# Patient Record
Sex: Female | Born: 1955 | Race: White | Hispanic: No | Smoking: Never smoker
Health system: Southern US, Community
[De-identification: ages and names within clinical notes are randomized; demographics above are authoritative.]

## PROBLEM LIST (undated history)

## (undated) ENCOUNTER — Emergency Department: Payer: Self-pay

## (undated) DIAGNOSIS — J329 Chronic sinusitis, unspecified: Secondary | ICD-10-CM

## (undated) DIAGNOSIS — I10 Essential (primary) hypertension: Secondary | ICD-10-CM

## (undated) HISTORY — PX: ECTOPIC PREGNANCY SURGERY: SHX613

---

## 2014-09-21 ENCOUNTER — Telehealth: Payer: Self-pay | Admitting: Interventional Cardiology

## 2014-11-26 SURGERY — LAPAROSCOPIC CHOLECYSTECTOMY
Anesthesia: Choice

## 2015-01-18 ENCOUNTER — Ambulatory Visit: Payer: Self-pay | Attending: Oncology | Admitting: *Deleted

## 2015-01-18 ENCOUNTER — Encounter: Payer: Self-pay | Admitting: *Deleted

## 2015-01-18 ENCOUNTER — Other Ambulatory Visit: Payer: Self-pay | Admitting: *Deleted

## 2015-01-18 ENCOUNTER — Ambulatory Visit
Admission: RE | Admit: 2015-01-18 | Discharge: 2015-01-18 | Disposition: A | Discharge status: Home / Self Care | Payer: Self-pay | Source: Ambulatory Visit | Attending: Oncology | Admitting: Oncology

## 2015-01-18 VITALS — BP 150/89 | HR 87 | Temp 97.3°F | Resp 18 | Ht <= 58 in | Wt 139.1 lb

## 2015-01-18 DIAGNOSIS — Z0000 Encounter for general adult medical examination without abnormal findings: Secondary | ICD-10-CM

## 2015-01-18 DIAGNOSIS — N6489 Other specified disorders of breast: Secondary | ICD-10-CM

## 2015-01-18 NOTE — Progress Notes (Signed)
Subjective:     Patient ID: Jenille Laszlo, female   DOB: June 26, 1955, 59 y.o.   MRN: [id number]  HPI   Review of Systems     Objective:   Physical Exam  Pulmonary/Chest: Right breast exhibits no inverted nipple, no mass, no nipple discharge, no skin change and no tenderness. Left breast exhibits no inverted nipple, no mass, no nipple discharge, no skin change and no tenderness.       Assessment:     59 year old White female presents to [facility] for clinical breast exam and mammogram.  On clinical breast exam bilateral breast and abdomen with diffuse keratotic like lesions.  Taught self breast awareness. Patient has been screened for eligibility.  She does not have any insurance, Medicare or Medicaid.  She also meets financial eligibility.  Hand-out given on the Affordable Care Act.     Plan:     Screening mammogram ordered.  To follow-up per protocol.

## 2015-01-18 NOTE — Patient Instructions (Signed)
Gave patient hand-out, Women Staying Healthy, Active and Well from [facility], with education on breast health, pap smears, heart and colon health. 

## 2015-01-22 ENCOUNTER — Ambulatory Visit: Payer: Self-pay

## 2015-01-22 ENCOUNTER — Ambulatory Visit
Admission: RE | Admit: 2015-01-22 | Discharge: 2015-01-22 | Disposition: A | Discharge status: Home / Self Care | Payer: Self-pay | Source: Ambulatory Visit | Attending: Oncology | Admitting: Oncology

## 2015-01-22 DIAGNOSIS — N6489 Other specified disorders of breast: Secondary | ICD-10-CM

## 2015-01-27 ENCOUNTER — Encounter: Payer: Self-pay | Admitting: *Deleted

## 2015-01-27 NOTE — Progress Notes (Signed)
Letter mailed from the [facility] to inform patient of her normal mammogram results.  Patient is to follow-up with annual screening in one year.  HSIS to Christy. 

## 2015-07-14 ENCOUNTER — Emergency Department: Payer: Self-pay

## 2015-07-14 ENCOUNTER — Emergency Department
Admission: EM | Admit: 2015-07-14 | Discharge: 2015-07-14 | Disposition: A | Discharge status: Home / Self Care | Payer: Self-pay | Attending: Emergency Medicine | Admitting: Emergency Medicine

## 2015-07-14 ENCOUNTER — Encounter: Payer: Self-pay | Admitting: Emergency Medicine

## 2015-07-14 DIAGNOSIS — R0602 Shortness of breath: Secondary | ICD-10-CM | POA: Insufficient documentation

## 2015-07-14 DIAGNOSIS — R079 Chest pain, unspecified: Secondary | ICD-10-CM | POA: Insufficient documentation

## 2015-07-14 DIAGNOSIS — F419 Anxiety disorder, unspecified: Secondary | ICD-10-CM | POA: Insufficient documentation

## 2015-07-14 LAB — CBC
HCT: 39.1 % (ref 35.0–47.0)
Hemoglobin: 13.1 g/dL (ref 12.0–16.0)
MCH: 29.4 pg (ref 26.0–34.0)
MCHC: 33.5 g/dL (ref 32.0–36.0)
MCV: 87.8 fL (ref 80.0–100.0)
Platelets: 239 10*3/uL (ref 150–440)
RBC: 4.45 MIL/uL (ref 3.80–5.20)
RDW: 12.7 % (ref 11.5–14.5)
WBC: 8 10*3/uL (ref 3.6–11.0)

## 2015-07-14 LAB — BASIC METABOLIC PANEL
Anion gap: 7 (ref 5–15)
BUN: 12 mg/dL (ref 6–20)
CO2: 26 mmol/L (ref 22–32)
Calcium: 9.3 mg/dL (ref 8.9–10.3)
Chloride: 107 mmol/L (ref 101–111)
Creatinine, Ser: 0.97 mg/dL (ref 0.44–1.00)
GFR calc Af Amer: >60 mL/min (ref >60)
GFR calc non Af Amer: >60 mL/min (ref >60)
Glucose, Bld: 117 mg/dL — ABNORMAL HIGH (ref 65–99)
Potassium: 4.5 mmol/L (ref 3.5–5.1)
Sodium: 140 mmol/L (ref 135–145)

## 2015-07-14 LAB — TROPONIN I: Troponin I: <0.03 ng/mL (ref <0.031)

## 2015-07-14 NOTE — ED Notes (Signed)
Patient presents to the ED with chest pain and shortness of breath.  Patient states she has had chest pain that felt like a pressure on the left side of her chest intermittently for about 2 weeks.  Today patient states as she was driving her car she felt a sudden sharp chest pain that caused her to feel anxious and short of breath.  Patient states that pain has been intermittent since that time.

## 2017-12-19 ENCOUNTER — Emergency Department
Admission: EM | Admit: 2017-12-19 | Discharge: 2017-12-19 | Disposition: A | Discharge status: Home / Self Care | Payer: Self-pay | Attending: Emergency Medicine | Admitting: Emergency Medicine

## 2017-12-19 ENCOUNTER — Encounter: Payer: Self-pay | Admitting: *Deleted

## 2017-12-19 ENCOUNTER — Emergency Department: Payer: Self-pay

## 2017-12-19 ENCOUNTER — Other Ambulatory Visit: Payer: Self-pay

## 2017-12-19 DIAGNOSIS — R0789 Other chest pain: Secondary | ICD-10-CM

## 2017-12-19 DIAGNOSIS — R079 Chest pain, unspecified: Secondary | ICD-10-CM | POA: Insufficient documentation

## 2017-12-19 DIAGNOSIS — T63461A Toxic effect of venom of wasps, accidental (unintentional), initial encounter: Secondary | ICD-10-CM

## 2017-12-19 LAB — CBC
HCT: 39.6 % (ref 35.0–47.0)
Hemoglobin: 13.7 g/dL (ref 12.0–16.0)
MCH: 30.8 pg (ref 26.0–34.0)
MCHC: 34.5 g/dL (ref 32.0–36.0)
MCV: 89.3 fL (ref 80.0–100.0)
Platelets: 244 10*3/uL (ref 150–440)
RBC: 4.43 MIL/uL (ref 3.80–5.20)
RDW: 13.4 % (ref 11.5–14.5)
WBC: 7.9 10*3/uL (ref 3.6–11.0)

## 2017-12-19 LAB — BASIC METABOLIC PANEL
Anion gap: 9 (ref 5–15)
BUN: 17 mg/dL (ref 8–23)
CO2: 23 mmol/L (ref 22–32)
Calcium: 8.9 mg/dL (ref 8.9–10.3)
Chloride: 103 mmol/L (ref 98–111)
Creatinine, Ser: 1.07 mg/dL — ABNORMAL HIGH (ref 0.44–1.00)
GFR calc Af Amer: >60 mL/min (ref >60)
GFR calc non Af Amer: 54 mL/min — ABNORMAL LOW (ref >60)
Glucose, Bld: 179 mg/dL — ABNORMAL HIGH (ref 70–99)
Potassium: 3.9 mmol/L (ref 3.5–5.1)
Sodium: 135 mmol/L (ref 135–145)

## 2017-12-19 LAB — TROPONIN I
Troponin I: <0.03 ng/mL (ref <0.03)
Troponin I: <0.03 ng/mL (ref <0.03)

## 2017-12-19 MED ORDER — METHYLPREDNISOLONE SODIUM SUCC 125 MG IJ SOLR
125.0000 mg | Freq: Once | INTRAMUSCULAR | Status: AC
  Administered 2017-12-19: 125 mg via INTRAVENOUS
  Filled 2017-12-19: qty 2

## 2017-12-19 MED ORDER — EPINEPHRINE 0.3 MG/0.3ML IJ SOAJ: 0.3000 mg | Freq: Once | INTRAMUSCULAR | 0 refills | Status: AC

## 2017-12-19 MED ORDER — PREDNISONE 10 MG (21) PO TBPK: ORAL_TABLET | Freq: Every day | ORAL | 0 refills | Status: DC

## 2017-12-19 MED ORDER — EPINEPHRINE 0.3 MG/0.3ML IJ SOAJ
INTRAMUSCULAR | Status: AC
  Filled 2017-12-19: qty 0.3

## 2017-12-19 MED ORDER — SODIUM CHLORIDE 0.9 % IV SOLN
Freq: Once | INTRAVENOUS | Status: AC
  Administered 2017-12-19: 14:00:00 via INTRAVENOUS

## 2017-12-19 NOTE — ED Notes (Addendum)
Pt denies shortness of breath or difficulty swallowing - she is c/o chest pain that comes and goes - pt has a rash on bilat arms/legs/chest and abd Dr Mayford KnifeWilliams at bedside Pt got stung by 10-12 yellow jackets and took 50mg  benadryl immediately Pt went to [facility] and they advised her to come to the ED The incident about one hour ago

## 2017-12-19 NOTE — ED Provider Notes (Signed)
[facility] Emergency Department Provider Note       Time seen: ----------------------------------------- 1:43 PM on 12/19/2017 -----------------------------------------   I have reviewed the triage vital signs and the nursing notes.  HISTORY   Chief Complaint No chief complaint on file.    HPI Stacey Francis is a 62 y.o. female with a history of chronic fatigue who presents to the ED for chest pain after being stung by 8-10 yellow jackets this afternoon.  This occurred approximately 45 minutes to an hour prior to arrival.  She took Benadryl at home without any relief, was noted to have a rash all over parts of her body but worse on her abdomen.  She started having chest tightness prior to coming to the ER.  She presents here for further evaluation.  She denies any difficulty breathing or swallowing.  She denies a history of any previous severe reaction.  No past medical history on file.  There are no active problems to display for this patient.   No past surgical history on file.  Allergies Penicillins  Social History Social History   Tobacco Use  . Smoking status: Never Smoker  . Smokeless tobacco: Never Used  Substance Use Topics  . Alcohol use: No  . Drug use: No   Review of Systems Constitutional: Negative for fever. ENT:  Negative for difficulty swallowing Cardiovascular: Positive for chest pain Respiratory: Negative for shortness of breath. Gastrointestinal: Negative for abdominal pain, vomiting and diarrhea. Musculoskeletal: Negative for back pain. Skin: Positive for hives Neurological: Negative for headaches, focal weakness or numbness.  All systems negative/normal/unremarkable except as stated in the HPI  ____________________________________________   PHYSICAL EXAM:  VITAL SIGNS: ED Triage Vitals  Enc Vitals Group     BP      Pulse      Resp      Temp      Temp src      SpO2      Weight      Height      Head  Circumference      Peak Flow      Pain Score      Pain Loc      Pain Edu?      Excl. in GC?    Constitutional: Alert and oriented. Well appearing and in no distress. Eyes: Conjunctivae are normal. Normal extraocular movements. ENT   Head: Normocephalic and atraumatic.   Nose: No congestion/rhinnorhea.   Mouth/Throat: Mucous membranes are moist.  No swelling   Neck: No stridor. Cardiovascular: Normal rate, regular rhythm. No murmurs, rubs, or gallops. Respiratory: Normal respiratory effort without tachypnea nor retractions. Breath sounds are clear and equal bilaterally. No wheezes/rales/rhonchi. Gastrointestinal: Soft and nontender. Normal bowel sounds Musculoskeletal: Nontender with normal range of motion in extremities. No lower extremity tenderness nor edema. Neurologic:  Normal speech and language. No gross focal neurologic deficits are appreciated.  Skin: Diffuse urticarial lesions, worse over the extremities, but also on the trunk Psychiatric: Mood and affect are normal. Speech and behavior are normal.  ____________________________________________  EKG: Interpreted by me.  Sinus rhythm rate 95 bpm, normal PR interval, normal QRS, normal QT  ____________________________________________  ED COURSE:  As part of my medical decision making, I reviewed the following data within the electronic MEDICAL RECORD NUMBER History obtained from family if available, nursing notes, old chart and ekg, as well as notes from prior ED visits. Patient presented for chest pain and recent yellowjacket envenomation, we will assess with  labs and imaging as indicated at this time.   Procedures ____________________________________________   LABS (pertinent positives/negatives)  Labs Reviewed  BASIC METABOLIC PANEL - Abnormal; Notable for the following components:      Result Value   Glucose, Bld 179 (*)    Creatinine, Ser 1.07 (*)    GFR calc non Af Amer 54 (*)    All other components  within normal limits  CBC  TROPONIN I  TROPONIN I    RADIOLOGY Images were viewed by me  Chest x-ray Is unremarkable ____________________________________________  DIFFERENTIAL DIAGNOSIS   Allergic reaction, anaphylaxis, MI, unstable angina, anxiety  FINAL ASSESSMENT AND PLAN  Yellowjacket stings, chest pain   Plan: The patient had presented for chest pain with recent yellowjacket stings. Patient's labs were unremarkable although repeat troponin is still pending. Patient's imaging was negative.  This was likely anxiety from recent yellowjacket stings.  She is low risk for ACS and has improved with fluids and Solu-Medrol with Benadryl here.  She will be discharged home with similar.   Ulice Dash, MD   Note: This note was generated in part or whole with voice recognition software. Voice recognition is usually quite accurate but there are transcription errors that can and very often do occur. I apologize for any typographical errors that were not detected and corrected.     Emily Filbert, MD 12/19/17 [phone]

## 2017-12-19 NOTE — ED Triage Notes (Signed)
Pt to ED reporting new onset of chest pain after being stung 8-10 times by bees this afternoon. Pt denies ever having a reaction to bee stings in the past and took 50 mg of benadryl at home without relief. Rash noted all over pts body but worse on her abd. Pt reports the chest tightness started prior to coming to the ED and denies SOB or feeling as though her throat is closing. While in triage pt also reports dizziness.

## 2017-12-19 NOTE — ED Notes (Signed)
Pt reports chest pain has subsided - denies shortness of breath - denies difficulty swallowing Pt in no acute distress

## 2017-12-19 NOTE — ED Provider Notes (Signed)
Signout from Dr. Mayford KnifeWilliams in this 62 year old female who is presenting to the emergency department today complaining of multiple bee stings.  Never suffered any shortness of breath or feeling of throat closure.  Just with itchy rash/hives.  Plan is to follow-up with repeat troponin secondary to patient initially complaining of chest pain.  Physical Exam  BP (!) 149/73   Pulse 87   Temp 98.4 F (36.9 C) (Oral)   Resp (!) 21   Wt 59 kg (130 lb)   SpO2 98%   BMI 23.03 kg/m   ----------------------------------------- 4:32 PM on 12/19/2017 -----------------------------------------    Physical Exam Patient at this time with several areas of circular erythema surrounding sting sites on the right upper extremity, distally on the hand and the forearm.  No further hives.  Patient still denying any feeling of throat closure or shortness of breath.  Not reporting any chest pain at this time. ED Course/Procedures     Procedures  MDM  Patient with improvement in reaction to her bee stings.  Not complaining of any chest pain at this time.  Will be discharged with prednisone we will also discharged with EpiPen.  Patient understanding the diagnosis as well as treatment plan willing to comply.       Myrna BlazerSchaevitz, Stacey Brashears Matthew, MD 12/19/17 [phone]

## 2017-12-19 NOTE — ED Triage Notes (Signed)
First RN Note: Pt reports stung by about 10 bees approx PTA to L forearm. Pt now c/o CP and hives, no known allergies to bee stings at this time. Pt is alert and oriented, able to speak in complete sentences without difficulty or SOB.

## 2017-12-26 ENCOUNTER — Emergency Department
Admission: EM | Admit: 2017-12-26 | Discharge: 2017-12-26 | Disposition: A | Discharge status: Home / Self Care | Payer: Self-pay | Attending: Emergency Medicine | Admitting: Emergency Medicine

## 2017-12-26 ENCOUNTER — Other Ambulatory Visit: Payer: Self-pay

## 2017-12-26 DIAGNOSIS — T887XXA Unspecified adverse effect of drug or medicament, initial encounter: Secondary | ICD-10-CM

## 2017-12-26 DIAGNOSIS — L509 Urticaria, unspecified: Secondary | ICD-10-CM

## 2017-12-26 DIAGNOSIS — T63441A Toxic effect of venom of bees, accidental (unintentional), initial encounter: Secondary | ICD-10-CM | POA: Insufficient documentation

## 2017-12-26 DIAGNOSIS — Y69 Unspecified misadventure during surgical and medical care: Secondary | ICD-10-CM | POA: Insufficient documentation

## 2017-12-26 MED ORDER — RANITIDINE HCL 150 MG PO TABS: 150.0000 mg | ORAL_TABLET | Freq: Two times a day (BID) | ORAL | 0 refills | Status: DC

## 2017-12-26 MED ORDER — CYPROHEPTADINE HCL 4 MG PO TABS: 4.0000 mg | ORAL_TABLET | Freq: Three times a day (TID) | ORAL | 0 refills | Status: DC | PRN

## 2017-12-26 NOTE — Discharge Instructions (Addendum)
Your exam is consistent with possible common side effects from your steroid. Continue to dose your prescription meds for   histamine blockade as directed. Follow-up with your provider for ongoing symptoms.

## 2017-12-26 NOTE — ED Notes (Signed)
Pt states she was seen and treated last week here for anaphylactic reaction to multiple bee stings, states she took last prednisone yesterday and last night noticed her skin felt like it was burning and has hive on the right wrist and forehead and states she feels like she is having some throat swelling, but is not having any difficulty swallowing and breathing at present.

## 2017-12-26 NOTE — ED Triage Notes (Addendum)
Pt states "I'm having a weird reaction to coming off steroids." states last dose yesterday. Was taking prednisone. Alert, oriented, ambulatory. No distress noted. States skin feels like sun burn, sore throat, and anxiety. States breathing in fine. Denies any lip swelling. States was put on prednisone for bee stings to R wrist and forehead.

## 2017-12-26 NOTE — ED Provider Notes (Signed)
[facility] Emergency Department Provider Note ____________________________________________  Time seen: 1400  I have reviewed the triage vital signs and the nursing notes.  HISTORY  Chief Complaint  Allergic Reaction  HPI Stacey Francis is a 62 y.o. female presents to the ED accompanied by family, for evaluation of recurrent hives following multiple bee stings.  Patient describes she was stung by multiple yellow jackets last week. She presented herself to the ED after the onset of chest pain. She denies a history of anaphylaxis to previous bee stings. She was sent home on prednisone and an epi-pen. She has been improving until last night, after her last steroid pill. She began to notice some sore throat, muscle pains, and anxiety. She has been eating and drinking without difficulty. She denies any chest pain, SOB, cough, wheeze, or swelling of the lips or tongue.   History reviewed. No pertinent past medical history.  There are no active problems to display for this patient.   History reviewed. No pertinent surgical history.  Prior to Admission medications   Medication Sig Start Date End Date Taking? Authorizing Provider  cyproheptadine (PERIACTIN) 4 MG tablet Take 1 tablet (4 mg total) by mouth 3 (three) times daily as needed for allergies. 12/26/17   Arlenis Blaydes, Charlesetta IvoryJenise V Bacon, PA-C  predniSONE (STERAPRED UNI-PAK 21 TAB) 10 MG (21) TBPK tablet Take by mouth daily. Dispense steroid taper pack as directed 12/19/17   Emily FilbertWilliams, Jonathan E, MD  ranitidine (ZANTAC) 150 MG tablet Take 1 tablet (150 mg total) by mouth 2 (two) times daily. 12/26/17   Ludie Pavlik, Charlesetta IvoryJenise V Bacon, PA-C   Allergies Penicillins  History reviewed. No pertinent family history.  Social History Social History   Tobacco Use  . Smoking status: Never Smoker  . Smokeless tobacco: Never Used  Substance Use Topics  . Alcohol use: No  . Drug use: No    Review of Systems  Constitutional: Negative  for fever. Eyes: Negative for visual changes. ENT: Positive for sore throat. Cardiovascular: Negative for chest pain. Respiratory: Negative for shortness of breath. Gastrointestinal: Negative for abdominal pain, vomiting and diarrhea. Genitourinary: Negative for dysuria. Musculoskeletal: Negative for back pain. Reports some general muscle pain Skin: Negative for rash. Neurological: Negative for headaches, focal weakness or numbness. ____________________________________________  PHYSICAL EXAM:  VITAL SIGNS: ED Triage Vitals  Enc Vitals Group     BP 12/26/17 1217 (!) 183/91     Pulse Rate 12/26/17 1217 (!) 108     Resp 12/26/17 1217 18     Temp 12/26/17 1217 98.6 F (37 C)     Temp Source 12/26/17 1217 Oral     SpO2 12/26/17 1217 100 %     Weight 12/26/17 1218 135 lb (61.2 kg)     Height 12/26/17 1218 5\' 3"  (1.6 m)     Head Circumference --      Peak Flow --      Pain Score 12/26/17 1218 4     Pain Loc --      Pain Edu? --      Excl. in GC? --     Constitutional: Alert and oriented. Well appearing and in no distress. Head: Normocephalic and atraumatic. Eyes: Conjunctivae are normal. PERRL. Normal extraocular movements Ears: Canals clear. TMs intact bilaterally. Nose: No congestion/rhinorrhea/epistaxis. Mouth/Throat: Mucous membranes are moist.  Uvula is midline and tonsils are flat.  No oropharyngeal lesions, erythema, or edema appreciated. Neck: Supple. No thyromegaly. Hematological/Lymphatic/Immunological: No cervical lymphadenopathy. Cardiovascular: Normal rate, regular rhythm. Normal  distal pulses. Respiratory: Normal respiratory effort. No wheezes/rales/rhonchi. Gastrointestinal: Soft and nontender. No distention. Musculoskeletal: Nontender with normal range of motion in all extremities.  Neurologic:  Normal gait without ataxia. Normal speech and language. No gross focal neurologic deficits are appreciated. Skin:  Skin is warm, dry and intact.  Patient with several  areas of some local erythema surrounding several of her previous yellowjacket stings.  No lymph and guidance, induration, warmth, fluctuance is appreciated. Psychiatric: Mood and affect are normal. Patient exhibits appropriate insight and judgment. ____________________________________________  PROCEDURES  Procedures ____________________________________________  INITIAL IMPRESSION / ASSESSMENT AND PLAN / ED COURSE  Patient with ED evaluation of some recurrent hives to the areas where she had been previously stung.  Patient's exam is overall benign.  No signs of any angioedema, anaphylaxis, airway compromise, or respiratory distress.  Patient may be experiencing side effects to the prednisone.  She has 1 pill remaining, has been advised that she may not necessarily need to finish that p.o.  She will be discharged however, with a prescription for ranitidine and Periactin, for histamine blockade.  Patient is encouraged to follow-up with her primary provider, or return to the ED immediately for worsening symptoms as discussed patient also encouraged to use her EpiPen should she develop any signs of difficulty breathing or angioedema.  Patient verbalizes understanding and is discharged without difficulty. ____________________________________________  FINAL CLINICAL IMPRESSION(S) / ED DIAGNOSES  Final diagnoses:  Hives  Side effect of drug      Lissa Hoard, PA-C 12/26/17 1908    Merrily Brittle, MD 12/27/17 1910

## 2018-10-10 DIAGNOSIS — Z1159 Encounter for screening for other viral diseases: Secondary | ICD-10-CM | POA: Insufficient documentation

## 2018-10-10 DIAGNOSIS — R079 Chest pain, unspecified: Secondary | ICD-10-CM | POA: Insufficient documentation

## 2018-10-10 NOTE — ED Triage Notes (Addendum)
Pt c/o chest pain, medial, substernal, reproducible w/ inspiration. Pt states her chest feels tight. Pt denies radiation of pain. Pt denies n/v/d, cardiac hx, fever. Pt has recent PMH of sinus infection, finishing a course of doxycycline presently. Pt ambulatory w/o distress to triage.Pt also c/o numbness on L cheek and L side of tongue starting 2230.

## 2018-10-11 ENCOUNTER — Emergency Department: Payer: Self-pay

## 2018-10-11 ENCOUNTER — Other Ambulatory Visit: Payer: Self-pay

## 2018-10-11 ENCOUNTER — Emergency Department
Admission: EM | Admit: 2018-10-11 | Discharge: 2018-10-11 | Disposition: A | Discharge status: Home / Self Care | Payer: Self-pay | Attending: Emergency Medicine | Admitting: Emergency Medicine

## 2018-10-11 ENCOUNTER — Encounter: Payer: Self-pay | Admitting: *Deleted

## 2018-10-11 DIAGNOSIS — R202 Paresthesia of skin: Secondary | ICD-10-CM

## 2018-10-11 DIAGNOSIS — R200 Anesthesia of skin: Secondary | ICD-10-CM

## 2018-10-11 DIAGNOSIS — R079 Chest pain, unspecified: Secondary | ICD-10-CM

## 2018-10-11 HISTORY — DX: Chronic sinusitis, unspecified: J32.9

## 2018-10-11 LAB — BASIC METABOLIC PANEL
Anion gap: 11 (ref 5–15)
BUN: 18 mg/dL (ref 8–23)
CO2: 24 mmol/L (ref 22–32)
Calcium: 9.2 mg/dL (ref 8.9–10.3)
Chloride: 106 mmol/L (ref 98–111)
Creatinine, Ser: 1.28 mg/dL — ABNORMAL HIGH (ref 0.44–1.00)
GFR calc Af Amer: 52 mL/min — ABNORMAL LOW (ref >60)
GFR calc non Af Amer: 44 mL/min — ABNORMAL LOW (ref >60)
Glucose, Bld: 103 mg/dL — ABNORMAL HIGH (ref 70–99)
Potassium: 3.7 mmol/L (ref 3.5–5.1)
Sodium: 141 mmol/L (ref 135–145)

## 2018-10-11 LAB — CBC
HCT: 38.5 % (ref 36.0–46.0)
Hemoglobin: 12.8 g/dL (ref 12.0–15.0)
MCH: 29.6 pg (ref 26.0–34.0)
MCHC: 33.2 g/dL (ref 30.0–36.0)
MCV: 89.1 fL (ref 80.0–100.0)
Platelets: 249 10*3/uL (ref 150–400)
RBC: 4.32 MIL/uL (ref 3.87–5.11)
RDW: 12.2 % (ref 11.5–15.5)
WBC: 8.4 10*3/uL (ref 4.0–10.5)
nRBC: 0 % (ref 0.0–0.2)

## 2018-10-11 LAB — TROPONIN I
Troponin I: <0.03 ng/mL (ref <0.03)
Troponin I: <0.03 ng/mL (ref <0.03)

## 2018-10-11 LAB — APTT: aPTT: <24 seconds — ABNORMAL LOW (ref 24–36)

## 2018-10-11 LAB — SARS CORONAVIRUS 2 BY RT PCR (HOSPITAL ORDER, PERFORMED IN ~~LOC~~ HOSPITAL LAB): SARS Coronavirus 2: NEGATIVE

## 2018-10-11 LAB — DIFFERENTIAL
Abs Immature Granulocytes: 0.02 10*3/uL (ref 0.00–0.07)
Basophils Absolute: 0.1 10*3/uL (ref 0.0–0.1)
Basophils Relative: 1 %
Eosinophils Absolute: 0.1 10*3/uL (ref 0.0–0.5)
Eosinophils Relative: 1 %
Immature Granulocytes: 0 %
Lymphocytes Relative: 28 %
Lymphs Abs: 2.4 10*3/uL (ref 0.7–4.0)
Monocytes Absolute: 0.7 10*3/uL (ref 0.1–1.0)
Monocytes Relative: 9 %
Neutro Abs: 5.2 10*3/uL (ref 1.7–7.7)
Neutrophils Relative %: 61 %

## 2018-10-11 LAB — PROTIME-INR
INR: 0.9 (ref 0.8–1.2)
Prothrombin Time: 12 seconds (ref 11.4–15.2)

## 2018-10-11 MED ORDER — ASPIRIN 81 MG PO CHEW
324.0000 mg | CHEWABLE_TABLET | Freq: Once | ORAL | Status: AC
  Administered 2018-10-11: 324 mg via ORAL
  Filled 2018-10-11: qty 4

## 2018-10-11 MED ORDER — SODIUM CHLORIDE 0.9% FLUSH
3.0000 mL | Freq: Once | INTRAVENOUS | Status: DC
Start: 2018-10-11 — End: 2018-10-11

## 2018-10-11 MED ORDER — SODIUM CHLORIDE 0.9% FLUSH: 3.0000 mL | Freq: Once | INTRAVENOUS | Status: DC

## 2018-10-11 NOTE — ED Notes (Signed)
Patient transported to MRI by this tech

## 2018-10-11 NOTE — Discharge Instructions (Signed)
As we discussed, we evaluated you extensively and your work-up was reassuring.  Your EKG is normal, you had two negative troponins (heart enzymes), the rest of your lab work was reassuring including a negative coronavirus test, and your MRI of your brain did not show any sign of recent stroke.  We offered to admit you to the hospital for possible mini stroke (TIA), but you would prefer not to stay in the hospital and you and I both agreed that it is likely not necessary.  I recommend you take a daily full dose aspirin (4 baby aspirin) and follow-up with your regular doctor and/or cardiologist such as Dr. Gwen Pounds for further evaluation of your chest pain and other symptoms.  If you develop ANY new or worsening symptoms that concern you, please return immediately to the emergency department.

## 2018-10-11 NOTE — ED Notes (Addendum)
Pt to MRI with ED tech

## 2018-10-11 NOTE — ED Notes (Signed)
Pt given phone to answer questions for MRI. Alert and able to answer questions without difficulty. Pt states she continues to feel better and pain is improving.

## 2018-10-11 NOTE — ED Provider Notes (Signed)
[facility] Emergency Department Provider Note  ____________________________________________   First MD Initiated Contact with Patient 10/11/18 [phone]     (approximate)  I have reviewed the triage vital signs and the nursing notes.   HISTORY  Chief Complaint Chest Pain    HPI Stacey Francis is a 63 y.o. female    who denies any chronic medical history and does not take medication for anything except that she was recently treated for a sinus infection with doxycycline.  She presents tonight for evaluation about 8 hours ago of acute onset tingling in bilateral hands.  Over the course of the evening she developed some aching and sharp central chest pain with some left-sided facial numbness and tingling including her tongue.  She has had no weakness but the numbness persists.  She denies shortness of breath, fever/chills, cough, nausea, vomiting, abdominal pain, and sweating episodes.  She has no personal history of cardiac disease or stroke.  She recently has started taking a daily baby aspirin but that was only over the last couple of weeks.  She was treated empirically with doxycycline for a sinus infection but about 8 or 9 days ago she was having body aches, sore throat, subjective fever, nasal congestion, and it was presumed to be a sinus infection although COVID-19 was also possible but it was not tested.  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  She says she is currently feeling better although she still has some tingling in her hands and on the left side of her face.  The chest pain has almost completely resolved.  She has no history of diabetes, hypertension, tobacco use, hyperlipidemia, and she does have some first-degree relatives that have had heart attacks but they were much older and also smoked and drank alcohol.        Past Medical History:  Diagnosis Date   Sinus infection     There are no  active problems to display for this patient.   History reviewed. No pertinent surgical history.  Prior to Admission medications   Medication Sig Start Date End Date Taking? Authorizing Provider  cyproheptadine (PERIACTIN) 4 MG tablet Take 1 tablet (4 mg total) by mouth 3 (three) times daily as needed for allergies. 12/26/17   Menshew, Charlesetta Ivory, PA-C  predniSONE (STERAPRED UNI-PAK 21 TAB) 10 MG (21) TBPK tablet Take by mouth daily. Dispense steroid taper pack as directed 12/19/17   Emily Filbert, MD  ranitidine (ZANTAC) 150 MG tablet Take 1 tablet (150 mg total) by mouth 2 (two) times daily. 12/26/17   Menshew, Charlesetta Ivory, PA-C    Allergies Penicillins  History reviewed. No pertinent family history.  Social History Social History   Tobacco Use   Smoking status: Never Smoker   Smokeless tobacco: Never Used  Substance Use Topics   Alcohol use: No   Drug use: No    Review of Systems Constitutional: No fever/chills Eyes: No visual changes. ENT: Recent viral or sinus infection symptoms, now mostly resolved on doxycycline. Cardiovascular: +chest pain. Respiratory: Denies shortness of breath. Gastrointestinal: No abdominal pain.  No nausea, no vomiting.  No diarrhea.  No constipation. Genitourinary: Negative for dysuria. Musculoskeletal: Negative for neck pain.  Negative for back pain. Integumentary: Negative for rash. Neurological: Numbness and tingling in bilateral hands as well as the left side of her face and tongue.   ____________________________________________   PHYSICAL EXAM:  VITAL  SIGNS: ED Triage Vitals  Enc Vitals Group     BP 10/11/18 0001 (!) 189/90     Pulse Rate 10/11/18 0001 95     Resp 10/11/18 0001 20     Temp 10/11/18 0001 98.5 F (36.9 C)     Temp Source 10/11/18 0001 Oral     SpO2 10/11/18 0001 98 %     Weight 10/11/18 0004 63.5 kg (140 lb)     Height 10/11/18 0004 1.6 m (5\' 3" )     Head Circumference --      Peak Flow --       Pain Score 10/11/18 0003 5     Pain Loc --      Pain Edu? --      Excl. in GC? --     Constitutional: Alert and oriented. Well appearing and in no acute distress. Eyes: Conjunctivae are normal. PERRL. EOMI. Head: Atraumatic. Nose: No congestion/rhinnorhea. Mouth/Throat: Mucous membranes are moist. Neck: No stridor.  No meningeal signs.   Cardiovascular: Normal rate, regular rhythm. Good peripheral circulation. Grossly normal heart sounds. Respiratory: Normal respiratory effort.  No retractions. No audible wheezing. Gastrointestinal: Soft and nontender. No distention.  Musculoskeletal: No lower extremity tenderness nor edema. No gross deformities of extremities. Neurologic:  Normal speech and language. No gross focal neurologic deficits are appreciated.  Her NIH stroke scale is 1 for subjective sensory deficits as described above.  She has good grip strength and muscle strength throughout her upper extremities, no tongue deviation, no obvious cranial nerve deficits. Skin:  Skin is warm, dry and intact. No rash noted. Psychiatric: Mood and affect are normal. Speech and behavior are normal.  ____________________________________________   LABS (all labs ordered are listed, but only abnormal results are displayed)  Labs Reviewed  BASIC METABOLIC PANEL - Abnormal; Notable for the following components:      Result Value   Glucose, Bld 103 (*)    Creatinine, Ser 1.28 (*)    GFR calc non Af Amer 44 (*)    GFR calc Af Amer 52 (*)    All other components within normal limits  APTT - Abnormal; Notable for the following components:   aPTT <24 (*)    All other components within normal limits  SARS CORONAVIRUS 2 (HOSPITAL ORDER, PERFORMED IN [facility])  CBC  TROPONIN I  PROTIME-INR  DIFFERENTIAL  TROPONIN I  CBG MONITORING, ED   ____________________________________________  EKG  ED ECG REPORT I, Loleta Roseory Thane Age, the attending physician, personally viewed and interpreted  this ECG.  Date: 10/11/2018 EKG Time: 00:01 Rate: 95 Rhythm: normal sinus rhythm QRS Axis: normal Intervals: normal ST/T Wave abnormalities: Non-specific ST segment / T-wave changes, but no clear evidence of acute ischemia. Narrative Interpretation: no definitive evidence of acute ischemia; does not meet STEMI criteria.   ____________________________________________  RADIOLOGY I, Loleta Roseory Naftali Carchi, personally discussed these images and results by phone with the on-call radiologist and used this discussion as part of my medical decision making.   ED MD interpretation: No obvious stroke on noncontrast head CT.  No evidence of CVA on MRI brain.  No evidence of acute abnormality on chest x-ray.  Official radiology report(s): Dg Chest 2 View  Result Date: 10/11/2018 CLINICAL DATA:  Chest pain EXAM: CHEST - 2 VIEW COMPARISON:  12/19/2017 FINDINGS: Heart and mediastinal contours are within normal limits. No focal opacities or effusions. No acute bony abnormality. IMPRESSION: No active cardiopulmonary disease. Electronically Signed   By: Charlett NoseKevin  Dover M.D.  On: 10/11/2018 01:22   Mr Brain Wo Contrast  Result Date: 10/11/2018 CLINICAL DATA:  63 y/o F; numbness on the left cheek and left-sided tongue. EXAM: MRI HEAD WITHOUT CONTRAST TECHNIQUE: Multiplanar, multiecho pulse sequences of the brain and surrounding structures were obtained without intravenous contrast. COMPARISON:  10/11/2018 CT head FINDINGS: Brain: No acute infarction, hemorrhage, hydrocephalus, extra-axial collection or mass lesion. Punctate and early confluent nonspecific T2 FLAIR hyperintensities in subcortical and periventricular white matter are compatible with mild to moderate chronic microvascular ischemic changes. Mild volume loss of the brain. Vascular: Left middle frontal gyrus branching vascular structure with surrounding T2 FLAIR signal abnormality in white matter, compatible with developmental venous anomaly (series 12, image 42).  Skull and upper cervical spine: Normal marrow signal. Sinuses/Orbits: Negative. Other: None. IMPRESSION: 1. No acute intracranial abnormality identified. 2. Mild-to-moderate chronic microvascular ischemic changes and mild volume loss of the brain. Electronically Signed   By: Mitzi Hansen M.D.   On: 10/11/2018 03:18   Ct Head Code Stroke Wo Contrast  Result Date: 10/11/2018 CLINICAL DATA:  Code stroke. Left cheek and left-sided tongue numbness. EXAM: CT HEAD WITHOUT CONTRAST TECHNIQUE: Contiguous axial images were obtained from the base of the skull through the vertex without intravenous contrast. COMPARISON:  None. FINDINGS: Brain: No evidence of acute infarction, hemorrhage, hydrocephalus, extra-axial collection or mass lesion/mass effect. Few nonspecific white matter hypodensities are compatible with chronic microvascular ischemic changes. Vascular: No hyperdense vessel or unexpected calcification. Skull: Normal. Negative for fracture or focal lesion. Sinuses/Orbits: No acute finding. Other: None. ASPECTS [facility] Stroke Program Early CT Score) - Ganglionic level infarction (caudate, lentiform nuclei, internal capsule, insula, M1-M3 cortex): 7 - Supraganglionic infarction (M4-M6 cortex): 3 Total score (0-10 with 10 being normal): 10 IMPRESSION: 1. No acute intracranial abnormality identified. 2. ASPECTS is 10 These results were called by telephone at the time of interpretation on 10/11/2018 at 12:34 am to Dr. Loleta Rose , who verbally acknowledged these results. Electronically Signed   By: Mitzi Hansen M.D.   On: 10/11/2018 00:37    ____________________________________________   PROCEDURES   Procedure(s) performed (including Critical Care):  Procedures   ____________________________________________   INITIAL IMPRESSION / MDM / ASSESSMENT AND PLAN / ED COURSE  As part of my medical decision making, I reviewed the following data within the electronic MEDICAL RECORD NUMBER  Nursing notes reviewed and incorporated, Labs reviewed , EKG interpreted , Old chart reviewed, Radiograph reviewed , Discussed with radiologist and Notes from prior ED visits      *ANDERIA LORENZO was evaluated in Emergency Department on 10/11/2018 for the symptoms described in the history of present illness. She was evaluated in the context of the global COVID-19 pandemic, which necessitated consideration that the patient might be at risk for infection with the SARS-CoV-2 virus that causes COVID-19. Institutional protocols and algorithms that pertain to the evaluation of patients at risk for COVID-19 are in a state of rapid change based on information released by regulatory bodies including the CDC and federal and state organizations. These policies and algorithms were followed during the patient's care in the ED.*  Differential diagnosis includes, but is not limited to, ACS, CVA, TIA, PE, COVID-19, pneumonia, other nonspecified viral infection.  The patient is already feeling better and is in no distress.  She is low risk for ACS but her symptoms are concerning.  However the presence of the chest discomfort suggest more of a cardiac nature but the numbness in her hands and the left side  of her face are more concerning for CVA/TIA.  She originally received a code stroke head CT but based on the presentation I did not make her a code stroke.  Her symptoms have been going on since much earlier this afternoon and she does not meet criteria for TPA based on her mild symptoms and the improvement of those symptoms.  However I think it is reasonable to obtain an MRI.  The patient does not want to stay in the hospital she does not have to (as if for a full "stroke work-up"), so we had an extensive discussion about it and decided to check for coronavirus given her recent viral symptoms and the possibility she may have to stay in the hospital, as well as checking basic labs, at least 2 troponins, and an MRI of her brain  to look for any evidence of acute ischemia.  If her work-up is reassuring and she continues to feel well, her preference is to go home and I think that is understandable and appropriate as long she follows up quickly as an outpatient.  Currently lab work is pending, MRI is pending, and we will plan on a repeat troponin assuming the first 1 comes back negative.  I am giving a full dose aspirin by mouth.  Patient passed the stroke swallow screen.  Clinical Course as of Oct 10 512  Fri Oct 11, 2018  2025 Spoke by phone with Dr. Harrie Jeans who reported no concerning findings on non-con head CT  CT HEAD CODE STROKE WO CONTRAST [CF]  0349 SARS Coronavirus 2: NEGATIVE [CF]  0349 No evidence of CVA.  MR BRAIN WO CONTRAST [CF]  0509 The patient's second troponin is negative.  I offered her again that we could admit her to the hospital for possible TIA but I do not think it is absolutely necessary and she does not want to stay in the hospital.  I recommended that she take a daily full dose aspirin rather than a baby aspirin and that she contact her regular doctor or a cardiologist to schedule a follow-up appointment.  She understands and agrees with the plan and I gave my usual and customary return precautions.   [CF]    Clinical Course User Index [CF] Loleta Rose, MD     ____________________________________________  FINAL CLINICAL IMPRESSION(S) / ED DIAGNOSES  Final diagnoses:  Chest pain, unspecified type  Numbness and tingling in both hands     MEDICATIONS GIVEN DURING THIS VISIT:  Medications  aspirin chewable tablet 324 mg (324 mg Oral Given 10/11/18 0435)     ED Discharge Orders    None       Note:  This document was prepared using Dragon voice recognition software and may include unintentional dictation errors.   Loleta Rose, MD 10/11/18 [phone]

## 2018-10-11 NOTE — ED Notes (Signed)
Pt returned from MRI. Alert and calm at this time. No distress noted.

## 2018-10-11 NOTE — ED Notes (Addendum)
Pt to xray; resting quietly on stretcher. States her chest pain is improving.

## 2018-10-24 ENCOUNTER — Encounter: Payer: Self-pay | Admitting: Emergency Medicine

## 2018-10-24 ENCOUNTER — Emergency Department
Admission: EM | Admit: 2018-10-24 | Discharge: 2018-10-24 | Disposition: A | Discharge status: Home / Self Care | Payer: Self-pay | Attending: Student in an Organized Health Care Education/Training Program | Admitting: Student in an Organized Health Care Education/Training Program

## 2018-10-24 ENCOUNTER — Other Ambulatory Visit: Payer: Self-pay

## 2018-10-24 DIAGNOSIS — I10 Essential (primary) hypertension: Secondary | ICD-10-CM | POA: Insufficient documentation

## 2018-10-24 DIAGNOSIS — Z79899 Other long term (current) drug therapy: Secondary | ICD-10-CM | POA: Insufficient documentation

## 2018-10-24 LAB — BASIC METABOLIC PANEL
Anion gap: 10 (ref 5–15)
BUN: 10 mg/dL (ref 8–23)
CO2: 24 mmol/L (ref 22–32)
Calcium: 9.3 mg/dL (ref 8.9–10.3)
Chloride: 107 mmol/L (ref 98–111)
Creatinine, Ser: 0.91 mg/dL (ref 0.44–1.00)
GFR calc Af Amer: >60 mL/min (ref >60)
GFR calc non Af Amer: >60 mL/min (ref >60)
Glucose, Bld: 101 mg/dL — ABNORMAL HIGH (ref 70–99)
Potassium: 4.2 mmol/L (ref 3.5–5.1)
Sodium: 141 mmol/L (ref 135–145)

## 2018-10-24 LAB — CBC
HCT: 40.7 % (ref 36.0–46.0)
Hemoglobin: 13.3 g/dL (ref 12.0–15.0)
MCH: 30 pg (ref 26.0–34.0)
MCHC: 32.7 g/dL (ref 30.0–36.0)
MCV: 91.7 fL (ref 80.0–100.0)
Platelets: 255 10*3/uL (ref 150–400)
RBC: 4.44 MIL/uL (ref 3.87–5.11)
RDW: 12.2 % (ref 11.5–15.5)
WBC: 6.7 10*3/uL (ref 4.0–10.5)
nRBC: 0 % (ref 0.0–0.2)

## 2018-10-24 MED ORDER — AMLODIPINE BESYLATE 5 MG PO TABS: 5.0000 mg | ORAL_TABLET | Freq: Every day | ORAL | 0 refills | Status: DC

## 2018-10-24 NOTE — ED Triage Notes (Signed)
Patient reports BP for last few days. States today, she felt anxious and dizzy. When she checked her BP at home, she noticed it was high. States got higher with each subsequent time. Patient also reports mild dizziness and pressure in head. Denies history of HTN. Does not take any BP medications.

## 2018-10-24 NOTE — ED Notes (Signed)
Reports high blood pressure x few days, md at bedside to assess.

## 2018-10-24 NOTE — ED Provider Notes (Signed)
[facility] Emergency Department Provider Note    First MD Initiated Contact with Patient 10/24/18 1800     (approximate)  I have reviewed the triage vital signs and the nursing notes.   HISTORY  Chief Complaint Hypertension and Dizziness    HPI Stacey Francis is a 63 y.o. female presents the ER for evaluation of high blood pressure.  States that she has been feeling nervous and anxious for the past month and was worried about her blood pressure.  States that she took it several times in a row and it kept getting elevated.  States that she was feeling lightheaded and came to the ER.  She is been trying to get clinic appointment but has been unable to read she is not currently on any antihypertensive medication.  She denies any chest pain shortness of breath nausea or vomiting.    Past Medical History:  Diagnosis Date  . Sinus infection    No family history on file. History reviewed. No pertinent surgical history. There are no active problems to display for this patient.     Prior to Admission medications   Medication Sig Start Date End Date Taking? Authorizing Provider  cyproheptadine (PERIACTIN) 4 MG tablet Take 1 tablet (4 mg total) by mouth 3 (three) times daily as needed for allergies. 12/26/17   Menshew, Charlesetta Ivory, PA-C  predniSONE (STERAPRED UNI-PAK 21 TAB) 10 MG (21) TBPK tablet Take by mouth daily. Dispense steroid taper pack as directed 12/19/17   Emily Filbert, MD  ranitidine (ZANTAC) 150 MG tablet Take 1 tablet (150 mg total) by mouth 2 (two) times daily. 12/26/17   Menshew, Charlesetta Ivory, PA-C    Allergies Penicillins    Social History Social History   Tobacco Use  . Smoking status: Never Smoker  . Smokeless tobacco: Never Used  Substance Use Topics  . Alcohol use: No  . Drug use: No    Review of Systems Patient denies headaches, rhinorrhea, blurry vision, numbness, shortness of breath, chest pain, edema, cough,  abdominal pain, nausea, vomiting, diarrhea, dysuria, fevers, rashes or hallucinations unless otherwise stated above in HPI. ____________________________________________   PHYSICAL EXAM:  VITAL SIGNS: Vitals:   10/24/18 1519  BP: (!) 182/90  Pulse: 96  Resp: 18  Temp: 98.8 F (37.1 C)    Constitutional: Alert and oriented.  Eyes: Conjunctivae are normal.  Head: Atraumatic. Nose: No congestion/rhinnorhea. Mouth/Throat: Mucous membranes are moist.   Neck: No stridor. Painless ROM.  Cardiovascular: Normal rate, regular rhythm. Grossly normal heart sounds.  Good peripheral circulation. Respiratory: Normal respiratory effort.  No retractions. Lungs CTAB. Gastrointestinal: Soft and nontender. No distention. No abdominal bruits. No CVA tenderness. Genitourinary:  Musculoskeletal: No lower extremity tenderness nor edema.  No joint effusions. Neurologic:  Normal speech and language. No gross focal neurologic deficits are appreciated. No facial droop Skin:  Skin is warm, dry and intact. No rash noted. Psychiatric: Mood and affect are normal. Speech and behavior are normal.  ____________________________________________   LABS (all labs ordered are listed, but only abnormal results are displayed)  Results for orders placed or performed during the hospital encounter of 10/24/18 (from the past 24 hour(s))  Basic metabolic panel     Status: Abnormal   Collection Time: 10/24/18  3:35 PM  Result Value Ref Range   Sodium 141 135 - 145 mmol/L   Potassium 4.2 3.5 - 5.1 mmol/L   Chloride 107 98 - 111 mmol/L   CO2 24  22 - 32 mmol/L   Glucose, Bld 101 (H) 70 - 99 mg/dL   BUN 10 8 - 23 mg/dL   Creatinine, Ser 4.690.91 0.44 - 1.00 mg/dL   Calcium 9.3 8.9 - 62.910.3 mg/dL   GFR calc non Af Amer >60 >60 mL/min   GFR calc Af Amer >60 >60 mL/min   Anion gap 10 5 - 15  CBC     Status: None   Collection Time: 10/24/18  3:35 PM  Result Value Ref Range   WBC 6.7 4.0 - 10.5 K/uL   RBC 4.44 3.87 - 5.11  MIL/uL   Hemoglobin 13.3 12.0 - 15.0 g/dL   HCT 52.840.7 41.336.0 - 24.446.0 %   MCV 91.7 80.0 - 100.0 fL   MCH 30.0 26.0 - 34.0 pg   MCHC 32.7 30.0 - 36.0 g/dL   RDW 01.012.2 27.211.5 - 53.615.5 %   Platelets 255 150 - 400 K/uL   nRBC 0.0 0.0 - 0.2 %   ____________________________________________  EKG My review and personal interpretation at Time: 15:26   Indication: htn  Rate: 90  Rhythm: sinus Axis: normal Other: normal intervals, no stemi ____________________________________________  ____________________________________________   PROCEDURES  Procedure(s) performed:  Procedures    Critical Care performed: no ____________________________________________   INITIAL IMPRESSION / ASSESSMENT AND PLAN / ED COURSE  Pertinent labs & imaging results that were available during my care of the patient were reviewed by me and considered in my medical decision making (see chart for details).   DDX: htn, htnive urgnecy, anxiety  Stacey Francis is a 63 y.o. who presents to the ED with symptoms as described above.  Patient is asymptomatic.  She is hypertensive but also very anxious.  Neuro exam is reassuring.  No signs or symptoms of endorgan damage.  We discussed options for initiating antihypertensive medication.  Patient elected to start Norvasc daily.  She has outpatient follow-up.  Have discussed with the patient and available family all diagnostics and treatments performed thus far and all questions were answered to the best of my ability. The patient demonstrates understanding and agreement with plan.      The patient was evaluated in Emergency Department today for the symptoms described in the history of present illness. He/she was evaluated in the context of the global COVID-19 pandemic, which necessitated consideration that the patient might be at risk for infection with the SARS-CoV-2 virus that causes COVID-19. Institutional protocols and algorithms that pertain to the evaluation of patients at risk  for COVID-19 are in a state of rapid change based on information released by regulatory bodies including the CDC and federal and state organizations. These policies and algorithms were followed during the patient's care in the ED.  As part of my medical decision making, I reviewed the following data within the electronic MEDICAL RECORD NUMBER Nursing notes reviewed and incorporated, Labs reviewed, notes from prior ED visits and [place] Controlled Substance Database   ____________________________________________   FINAL CLINICAL IMPRESSION(S) / ED DIAGNOSES  Final diagnoses:  Hypertension, unspecified type      NEW MEDICATIONS STARTED DURING THIS VISIT:  New Prescriptions   No medications on file     Note:  This document was prepared using Dragon voice recognition software and may include unintentional dictation errors.    Willy Eddyobinson, Kimi Bordeau, MD 10/24/18 [phone]

## 2018-12-03 ENCOUNTER — Encounter: Payer: Self-pay | Admitting: Emergency Medicine

## 2018-12-03 ENCOUNTER — Other Ambulatory Visit: Payer: Self-pay

## 2018-12-03 ENCOUNTER — Emergency Department
Admission: EM | Admit: 2018-12-03 | Discharge: 2018-12-03 | Disposition: A | Discharge status: Home / Self Care | Payer: Self-pay | Attending: Emergency Medicine | Admitting: Emergency Medicine

## 2018-12-03 ENCOUNTER — Emergency Department: Payer: Self-pay

## 2018-12-03 DIAGNOSIS — Z79899 Other long term (current) drug therapy: Secondary | ICD-10-CM | POA: Insufficient documentation

## 2018-12-03 DIAGNOSIS — R0789 Other chest pain: Secondary | ICD-10-CM | POA: Insufficient documentation

## 2018-12-03 LAB — CBC
HCT: 38.9 % (ref 36.0–46.0)
Hemoglobin: 13 g/dL (ref 12.0–15.0)
MCH: 29.5 pg (ref 26.0–34.0)
MCHC: 33.4 g/dL (ref 30.0–36.0)
MCV: 88.4 fL (ref 80.0–100.0)
Platelets: 246 10*3/uL (ref 150–400)
RBC: 4.4 MIL/uL (ref 3.87–5.11)
RDW: 11.9 % (ref 11.5–15.5)
WBC: 6.6 10*3/uL (ref 4.0–10.5)
nRBC: 0 % (ref 0.0–0.2)

## 2018-12-03 LAB — BASIC METABOLIC PANEL
Anion gap: 11 (ref 5–15)
BUN: 16 mg/dL (ref 8–23)
CO2: 24 mmol/L (ref 22–32)
Calcium: 9.1 mg/dL (ref 8.9–10.3)
Chloride: 102 mmol/L (ref 98–111)
Creatinine, Ser: 0.87 mg/dL (ref 0.44–1.00)
GFR calc Af Amer: >60 mL/min (ref >60)
GFR calc non Af Amer: >60 mL/min (ref >60)
Glucose, Bld: 122 mg/dL — ABNORMAL HIGH (ref 70–99)
Potassium: 3.9 mmol/L (ref 3.5–5.1)
Sodium: 137 mmol/L (ref 135–145)

## 2018-12-03 LAB — TROPONIN I (HIGH SENSITIVITY): Troponin I (High Sensitivity): <2 ng/L (ref <18)

## 2018-12-03 MED ORDER — SODIUM CHLORIDE 0.9% FLUSH: 3.0000 mL | Freq: Once | INTRAVENOUS | Status: DC

## 2018-12-03 NOTE — ED Provider Notes (Signed)
[facility] Emergency Department Provider Note   ____________________________________________    I have reviewed the triage vital signs and the nursing notes.   HISTORY  Chief Complaint Chest Pain and Dizziness     HPI Stacey Francis is a 63 y.o. female who presents with complaints of chest discomfort and right arm pain.  Patient reports she has had right arm pain intermittently over the last several days, today she felt mildly dizzy shortly thereafter had a strange sensation in her right chest.  Now resolved.  No shortness of breath.  No calf pain or swelling.  No nausea vomiting or diaphoresis.  No history of heart disease.  She does not smoke.  Does not take anything for this.  Past Medical History:  Diagnosis Date  . Sinus infection     There are no active problems to display for this patient.   History reviewed. No pertinent surgical history.  Prior to Admission medications   Medication Sig Start Date End Date Taking? Authorizing Provider  amLODipine (NORVASC) 5 MG tablet Take 1 tablet (5 mg total) by mouth daily. 10/24/18 10/24/19  Merlyn Lot, MD  cyproheptadine (PERIACTIN) 4 MG tablet Take 1 tablet (4 mg total) by mouth 3 (three) times daily as needed for allergies. 12/26/17   Menshew, Dannielle Karvonen, PA-C  predniSONE (STERAPRED UNI-PAK 21 TAB) 10 MG (21) TBPK tablet Take by mouth daily. Dispense steroid taper pack as directed 12/19/17   Earleen Newport, MD  ranitidine (ZANTAC) 150 MG tablet Take 1 tablet (150 mg total) by mouth 2 (two) times daily. 12/26/17   Menshew, Dannielle Karvonen, PA-C     Allergies Penicillins  No family history on file.  Social History Social History   Tobacco Use  . Smoking status: Never Smoker  . Smokeless tobacco: Never Used  Substance Use Topics  . Alcohol use: No  . Drug use: No    Review of Systems  Constitutional: No fever/chills Eyes: No visual changes.  ENT: No sore throat.  Cardiovascular: As above Respiratory: As above Gastrointestinal: No abdominal pain.  No nausea, no vomiting.   Genitourinary: Negative for dysuria. Musculoskeletal: Negative for back pain. Skin: Negative for rash. Neurological: Negative for headaches or weakness   ____________________________________________   PHYSICAL EXAM:  VITAL SIGNS: ED Triage Vitals [12/03/18 1251]  Enc Vitals Group     BP (!) 145/76     Pulse Rate 91     Resp 14     Temp 98.7 F (37.1 C)     Temp Source Oral     SpO2 100 %     Weight 61.7 kg (136 lb)     Height 1.6 m (5\' 3" )     Head Circumference      Peak Flow      Pain Score 5     Pain Loc      Pain Edu?      Excl. in [place]?     Constitutional: Alert and oriented. Eyes: Conjunctivae are normal.   Nose: No congestion/rhinnorhea. Mouth/Throat: Mucous membranes are moist.    Cardiovascular: Normal rate, regular rhythm. Grossly normal heart sounds.  Good peripheral circulation. Respiratory: Normal respiratory effort.  No retractions. Lungs CTAB. Gastrointestinal: Soft and nontender. No distention.  No CVA tenderness. Genitourinary: deferred Musculoskeletal: No lower extremity tenderness nor edema.  Warm and well perfused Neurologic:  Normal speech and language. No gross focal neurologic deficits are appreciated.  Skin:  Skin is warm, dry and  intact. No rash noted. Psychiatric: Mood and affect are normal. Speech and behavior are normal.  ____________________________________________   LABS (all labs ordered are listed, but only abnormal results are displayed)  Labs Reviewed  BASIC METABOLIC PANEL - Abnormal; Notable for the following components:      Result Value   Glucose, Bld 122 (*)    All other components within normal limits  CBC  TROPONIN I (HIGH SENSITIVITY)  TROPONIN I (HIGH SENSITIVITY)   ____________________________________________  EKG  ED ECG REPORT I, Jene Everyobert Valborg Friar, the attending physician, personally viewed and  interpreted this ECG.  Date: 12/03/2018  Rhythm: normal sinus rhythm QRS Axis: normal Intervals: normal ST/T Wave abnormalities: normal Narrative Interpretation: no evidence of acute ischemia  ____________________________________________  RADIOLOGY  X-ray unremarkable ____________________________________________   PROCEDURES  Procedure(s) performed: No  Procedures   Critical Care performed: No ____________________________________________   INITIAL IMPRESSION / ASSESSMENT AND PLAN / ED COURSE  Pertinent labs & imaging results that were available during my care of the patient were reviewed by me and considered in my medical decision making (see chart for details).  Patient overall well-appearing and in no acute distress.  Description of chest discomfort not consistent with ACS, PE, dissection.  Her right arm pain sounds more musculoskeletal.  EKG is quite reassuring.  Pending labs and chest x-ray   Lab work is unremarkable, troponin is less than 2, chest x-ray is benign.  Discussed this with patient she is reassured, she is essentially asymptomatic at this point, I will have her follow-up with cardiology for further evaluation, strict return precautions discussed    ____________________________________________   FINAL CLINICAL IMPRESSION(S) / ED DIAGNOSES  Final diagnoses:  Atypical chest pain        Note:  This document was prepared using Dragon voice recognition software and may include unintentional dictation errors.   Jene EveryKinner, Coralie Stanke, MD 12/03/18 [phone]

## 2018-12-03 NOTE — ED Triage Notes (Signed)
Says she has on and off pressure in chest, right arm hurts for about 3 hours.  Also feels dizzy.

## 2019-01-17 ENCOUNTER — Other Ambulatory Visit: Payer: Self-pay

## 2019-01-17 ENCOUNTER — Ambulatory Visit (LOCAL_COMMUNITY_HEALTH_CENTER): Payer: Self-pay

## 2019-01-17 DIAGNOSIS — Z111 Encounter for screening for respiratory tuberculosis: Secondary | ICD-10-CM

## 2019-01-20 ENCOUNTER — Ambulatory Visit (LOCAL_COMMUNITY_HEALTH_CENTER): Payer: Self-pay

## 2019-01-20 ENCOUNTER — Other Ambulatory Visit: Payer: Self-pay

## 2019-01-20 DIAGNOSIS — Z111 Encounter for screening for respiratory tuberculosis: Secondary | ICD-10-CM

## 2019-01-20 LAB — TB SKIN TEST
Induration: 0 mm
TB Skin Test: NEGATIVE

## 2019-10-14 ENCOUNTER — Encounter: Payer: Self-pay | Admitting: Emergency Medicine

## 2019-10-14 ENCOUNTER — Emergency Department
Admission: EM | Admit: 2019-10-14 | Discharge: 2019-10-14 | Disposition: A | Discharge status: Home / Self Care | Payer: Self-pay | Attending: Emergency Medicine | Admitting: Emergency Medicine

## 2019-10-14 ENCOUNTER — Other Ambulatory Visit: Payer: Self-pay

## 2019-10-14 ENCOUNTER — Emergency Department: Payer: Self-pay

## 2019-10-14 DIAGNOSIS — R079 Chest pain, unspecified: Secondary | ICD-10-CM | POA: Insufficient documentation

## 2019-10-14 DIAGNOSIS — I10 Essential (primary) hypertension: Secondary | ICD-10-CM | POA: Insufficient documentation

## 2019-10-14 DIAGNOSIS — R42 Dizziness and giddiness: Secondary | ICD-10-CM | POA: Insufficient documentation

## 2019-10-14 HISTORY — DX: Essential (primary) hypertension: I10

## 2019-10-14 LAB — CBC
HCT: 39.4 % (ref 36.0–46.0)
Hemoglobin: 13.3 g/dL (ref 12.0–15.0)
MCH: 29.4 pg (ref 26.0–34.0)
MCHC: 33.8 g/dL (ref 30.0–36.0)
MCV: 87 fL (ref 80.0–100.0)
Platelets: 246 10*3/uL (ref 150–400)
RBC: 4.53 MIL/uL (ref 3.87–5.11)
RDW: 12.3 % (ref 11.5–15.5)
WBC: 6.8 10*3/uL (ref 4.0–10.5)
nRBC: 0 % (ref 0.0–0.2)

## 2019-10-14 LAB — BASIC METABOLIC PANEL
Anion gap: 8 (ref 5–15)
BUN: 13 mg/dL (ref 8–23)
CO2: 26 mmol/L (ref 22–32)
Calcium: 9.4 mg/dL (ref 8.9–10.3)
Chloride: 104 mmol/L (ref 98–111)
Creatinine, Ser: 0.83 mg/dL (ref 0.44–1.00)
GFR calc Af Amer: >60 mL/min (ref >60)
GFR calc non Af Amer: >60 mL/min (ref >60)
Glucose, Bld: 137 mg/dL — ABNORMAL HIGH (ref 70–99)
Potassium: 3.8 mmol/L (ref 3.5–5.1)
Sodium: 138 mmol/L (ref 135–145)

## 2019-10-14 LAB — TROPONIN I (HIGH SENSITIVITY)
Troponin I (High Sensitivity): <2 ng/L (ref <18)
Troponin I (High Sensitivity): <2 ng/L (ref <18)

## 2019-10-14 NOTE — ED Triage Notes (Signed)
Pt reports pressure like CP that started this am. Pt also reports some dizziness as well. Pt denies SOB, nausea or other sx's.

## 2019-10-14 NOTE — ED Provider Notes (Signed)
[facility] Emergency Department Provider Note       Time seen: ----------------------------------------- 4:08 PM on 10/14/2019 -----------------------------------------   I have reviewed the triage vital signs and the nursing notes.  HISTORY   Chief Complaint Chest Pain    HPI Stacey Francis is a 64 y.o. female with a history of hypertension, sinus infection who presents to the ED for chest pain that started this morning.  She describes as a heaviness.  Also has some dizziness where she felt like she was going to pass out.  Discomfort was 7 out of 10, denies shortness of breath or nausea.  Past Medical History:  Diagnosis Date  . Hypertension   . Sinus infection     There are no problems to display for this patient.   History reviewed. No pertinent surgical history.  Allergies Penicillins  Social History Social History   Tobacco Use  . Smoking status: Never Smoker  . Smokeless tobacco: Never Used  Substance Use Topics  . Alcohol use: No  . Drug use: No    Review of Systems Constitutional: Negative for fever. Cardiovascular: Positive for chest pressure Respiratory: Negative for shortness of breath. Gastrointestinal: Negative for abdominal pain, vomiting and diarrhea. Musculoskeletal: Negative for back pain. Skin: Negative for rash. Neurological: Negative for headaches, focal weakness or numbness.  Positive for dizziness  All systems negative/normal/unremarkable except as stated in the HPI  ____________________________________________   PHYSICAL EXAM:  VITAL SIGNS: ED Triage Vitals  Enc Vitals Group     BP 10/14/19 1550 (!) 147/88     Pulse Rate 10/14/19 1550 83     Resp 10/14/19 1550 16     Temp 10/14/19 1552 98.3 F (36.8 C)     Temp Source 10/14/19 1552 Oral     SpO2 10/14/19 1550 100 %     Weight 10/14/19 1422 140 lb (63.5 kg)     Height 10/14/19 1422 5\' 3"  (1.6 m)     Head Circumference --      Peak Flow --    Pain Score 10/14/19 1422 7     Pain Loc --      Pain Edu? --      Excl. in [place]? --     Constitutional: Alert and oriented. Well appearing and in no distress. Eyes: Conjunctivae are normal. Normal extraocular movements. Cardiovascular: Normal rate, regular rhythm. No murmurs, rubs, or gallops. Respiratory: Normal respiratory effort without tachypnea nor retractions. Breath sounds are clear and equal bilaterally. No wheezes/rales/rhonchi. Gastrointestinal: Soft and nontender. Normal bowel sounds Musculoskeletal: Nontender with normal range of motion in extremities. No lower extremity tenderness nor edema. Neurologic:  Normal speech and language. No gross focal neurologic deficits are appreciated.  Skin:  Skin is warm, dry and intact. No rash noted. Psychiatric: Mood and affect are normal. Speech and behavior are normal.  ____________________________________________  EKG: Interpreted by me.  Sinus rhythm with rate of 97 bpm, normal PR interval, normal QRS, normal QT  ____________________________________________  ED COURSE:  As part of my medical decision making, I reviewed the following data within the [place] History obtained from family if available, nursing notes, old chart and ekg, as well as notes from prior ED visits. Patient presented for chest pain, we will assess with labs and imaging as indicated at this time.   Procedures  Stacey Francis was evaluated in Emergency Department on 10/14/2019 for the symptoms described in the history of present illness. She was evaluated in the  context of the global COVID-19 pandemic, which necessitated consideration that the patient might be at risk for infection with the SARS-CoV-2 virus that causes COVID-19. Institutional protocols and algorithms that pertain to the evaluation of patients at risk for COVID-19 are in a state of rapid change based on information released by regulatory bodies including the CDC and federal and state  organizations. These policies and algorithms were followed during the patient's care in the ED.  ____________________________________________   LABS (pertinent positives/negatives)  Labs Reviewed  BASIC METABOLIC PANEL - Abnormal; Notable for the following components:      Result Value   Glucose, Bld 137 (*)    All other components within normal limits  CBC  TROPONIN I (HIGH SENSITIVITY)  TROPONIN I (HIGH SENSITIVITY)    RADIOLOGY  Chest x-ray is unremarkable  ____________________________________________   DIFFERENTIAL DIAGNOSIS   Musculoskeletal pain, GERD, anxiety, arrhythmia, MI, unstable angina, PE  FINAL ASSESSMENT AND PLAN  Chest pain, dizziness   Plan: The patient had presented for chest pain and dizziness. Patient's labs were unremarkable. Patient's imaging not reveal any acute process.  She was not orthostatic here.  Should be referred to cardiology for close outpatient follow-up.   Ulice Dash, MD    Note: This note was generated in part or whole with voice recognition software. Voice recognition is usually quite accurate but there are transcription errors that can and very often do occur. I apologize for any typographical errors that were not detected and corrected.     Emily Filbert, MD 10/14/19 [phone]

## 2020-04-23 ENCOUNTER — Ambulatory Visit (LOCAL_COMMUNITY_HEALTH_CENTER): Payer: Self-pay

## 2020-04-23 ENCOUNTER — Other Ambulatory Visit: Payer: Self-pay

## 2020-04-23 DIAGNOSIS — Z111 Encounter for screening for respiratory tuberculosis: Secondary | ICD-10-CM

## 2020-04-26 ENCOUNTER — Other Ambulatory Visit: Payer: Self-pay

## 2020-04-26 ENCOUNTER — Ambulatory Visit (LOCAL_COMMUNITY_HEALTH_CENTER): Payer: Self-pay

## 2020-04-26 DIAGNOSIS — Z111 Encounter for screening for respiratory tuberculosis: Secondary | ICD-10-CM

## 2020-04-26 LAB — TB SKIN TEST
Induration: 0 mm
TB Skin Test: NEGATIVE

## 2020-11-09 ENCOUNTER — Emergency Department
Admission: EM | Admit: 2020-11-09 | Discharge: 2020-11-09 | Disposition: A | Discharge status: Home / Self Care | Payer: Medicare Other | Attending: Emergency Medicine | Admitting: Emergency Medicine

## 2020-11-09 ENCOUNTER — Other Ambulatory Visit: Payer: Self-pay

## 2020-11-09 ENCOUNTER — Emergency Department: Payer: Medicare Other

## 2020-11-09 DIAGNOSIS — R202 Paresthesia of skin: Secondary | ICD-10-CM | POA: Diagnosis present

## 2020-11-09 DIAGNOSIS — R42 Dizziness and giddiness: Secondary | ICD-10-CM | POA: Insufficient documentation

## 2020-11-09 DIAGNOSIS — Z79899 Other long term (current) drug therapy: Secondary | ICD-10-CM | POA: Insufficient documentation

## 2020-11-09 DIAGNOSIS — Z20822 Contact with and (suspected) exposure to covid-19: Secondary | ICD-10-CM | POA: Insufficient documentation

## 2020-11-09 DIAGNOSIS — I6782 Cerebral ischemia: Secondary | ICD-10-CM | POA: Insufficient documentation

## 2020-11-09 DIAGNOSIS — I10 Essential (primary) hypertension: Secondary | ICD-10-CM | POA: Insufficient documentation

## 2020-11-09 LAB — COMPREHENSIVE METABOLIC PANEL WITH GFR
ALT: 14 U/L (ref 0–44)
AST: 20 U/L (ref 15–41)
Albumin: 4.2 g/dL (ref 3.5–5.0)
Alkaline Phosphatase: 51 U/L (ref 38–126)
Anion gap: 12 (ref 5–15)
BUN: 15 mg/dL (ref 8–23)
CO2: 21 mmol/L — ABNORMAL LOW (ref 22–32)
Calcium: 9.2 mg/dL (ref 8.9–10.3)
Chloride: 103 mmol/L (ref 98–111)
Creatinine, Ser: 1.16 mg/dL — ABNORMAL HIGH (ref 0.44–1.00)
GFR, Estimated: 52 mL/min — ABNORMAL LOW
Glucose, Bld: 99 mg/dL (ref 70–99)
Potassium: 3.9 mmol/L (ref 3.5–5.1)
Sodium: 136 mmol/L (ref 135–145)
Total Bilirubin: 1.4 mg/dL — ABNORMAL HIGH (ref 0.3–1.2)
Total Protein: 7 g/dL (ref 6.5–8.1)

## 2020-11-09 LAB — RESP PANEL BY RT-PCR (FLU A&B, COVID) ARPGX2
Influenza A by PCR: NEGATIVE
Influenza B by PCR: NEGATIVE
SARS Coronavirus 2 by RT PCR: NEGATIVE

## 2020-11-09 LAB — DIFFERENTIAL
Abs Immature Granulocytes: 0.02 10*3/uL (ref 0.00–0.07)
Basophils Absolute: 0.1 10*3/uL (ref 0.0–0.1)
Basophils Relative: 1 %
Eosinophils Absolute: 0.1 10*3/uL (ref 0.0–0.5)
Eosinophils Relative: 2 %
Immature Granulocytes: 0 %
Lymphocytes Relative: 40 %
Lymphs Abs: 2.7 10*3/uL (ref 0.7–4.0)
Monocytes Absolute: 0.5 10*3/uL (ref 0.1–1.0)
Monocytes Relative: 8 %
Neutro Abs: 3.4 10*3/uL (ref 1.7–7.7)
Neutrophils Relative %: 49 %

## 2020-11-09 LAB — CBC
HCT: 38.5 % (ref 36.0–46.0)
Hemoglobin: 12.9 g/dL (ref 12.0–15.0)
MCH: 29.7 pg (ref 26.0–34.0)
MCHC: 33.5 g/dL (ref 30.0–36.0)
MCV: 88.7 fL (ref 80.0–100.0)
Platelets: 220 K/uL (ref 150–400)
RBC: 4.34 MIL/uL (ref 3.87–5.11)
RDW: 12 % (ref 11.5–15.5)
WBC: 6.8 K/uL (ref 4.0–10.5)
nRBC: 0 % (ref 0.0–0.2)

## 2020-11-09 LAB — PROTIME-INR
INR: 0.9 (ref 0.8–1.2)
Prothrombin Time: 12.4 seconds (ref 11.4–15.2)

## 2020-11-09 LAB — CBG MONITORING, ED: Glucose-Capillary: 85 mg/dL (ref 70–99)

## 2020-11-09 LAB — APTT: aPTT: 27 seconds (ref 24–36)

## 2020-11-09 MED ORDER — ASPIRIN 81 MG PO CHEW
CHEWABLE_TABLET | ORAL | Status: AC
  Administered 2020-11-09: 324 mg via ORAL
  Filled 2020-11-09: qty 4

## 2020-11-09 MED ORDER — SODIUM CHLORIDE 0.9% FLUSH: 3.0000 mL | Freq: Once | INTRAVENOUS | Status: DC

## 2020-11-09 MED ORDER — ASPIRIN 81 MG PO CHEW: 324.0000 mg | CHEWABLE_TABLET | Freq: Once | ORAL | Status: AC

## 2020-11-09 NOTE — ED Provider Notes (Signed)
[facility] Emergency Department Provider Note  ____________________________________________   Event Date/Time   First MD Initiated Contact with Patient 11/09/20 1041     (approximate)  I have reviewed the triage vital signs and the nursing notes.   HISTORY  Chief Complaint Code Stroke   HPI Stacey Francis is a 65 y.o. female the past with a history of hypertension who presents for assessment approximately 1 hour of left-sided facial and tongue numbness associate with some dizziness.  Patient states she had a similar episode on 6 4 that seem to resolve on its own.  Denies any other clear associated sick symptoms today including headache, vertigo, chest pain, cough, shortness of breath, Donnell pain, back pain, vomiting, diarrhea, dysuria, rash or recent injuries or falls.  She is not on any blood thinners.         Past Medical History:  Diagnosis Date  . Hypertension   . Sinus infection     There are no problems to display for this patient.   No past surgical history on file.  Prior to Admission medications   Medication Sig Start Date End Date Taking? Authorizing Provider  amLODipine (NORVASC) 5 MG tablet Take 1 tablet (5 mg total) by mouth daily. 10/24/18 10/24/19  Willy Eddy, MD  cyproheptadine (PERIACTIN) 4 MG tablet Take 1 tablet (4 mg total) by mouth 3 (three) times daily as needed for allergies. 12/26/17   Menshew, Charlesetta Ivory, PA-C  predniSONE (STERAPRED UNI-PAK 21 TAB) 10 MG (21) TBPK tablet Take by mouth daily. Dispense steroid taper pack as directed 12/19/17   Emily Filbert, MD  ranitidine (ZANTAC) 150 MG tablet Take 1 tablet (150 mg total) by mouth 2 (two) times daily. 12/26/17   Menshew, Charlesetta Ivory, PA-C    Allergies Penicillins  No family history on file.  Social History Social History   Tobacco Use  . Smoking status: Never Smoker  . Smokeless tobacco: Never Used  Vaping Use  . Vaping Use: Never used   Substance Use Topics  . Alcohol use: No  . Drug use: No    Review of Systems  Review of Systems  Constitutional: Negative for chills and fever.  HENT: Negative for sore throat.   Eyes: Negative for pain.  Respiratory: Negative for cough and stridor.   Cardiovascular: Negative for chest pain.  Gastrointestinal: Negative for vomiting.  Genitourinary: Negative for dysuria.  Musculoskeletal: Negative for myalgias.  Skin: Negative for rash.  Neurological: Positive for dizziness, tingling and sensory change. Negative for seizures, loss of consciousness and headaches.  Psychiatric/Behavioral: Negative for suicidal ideas.  All other systems reviewed and are negative.     ____________________________________________   PHYSICAL EXAM:  VITAL SIGNS: ED Triage Vitals [11/09/20 1039]  Enc Vitals Group     BP (!) 183/92     Pulse Rate 92     Resp 16     Temp 97.8 F (36.6 C)     Temp Source Oral     SpO2      Weight      Height      Head Circumference      Peak Flow      Pain Score      Pain Loc      Pain Edu?      Excl. in GC?    Vitals:   11/09/20 1100 11/09/20 1222  BP: (!) 181/91 (!) 165/76  Pulse: 94 91  Resp: (!) 21 (!) 22  Temp:    SpO2: 100% 98%   Physical Exam Vitals and nursing note reviewed.  Constitutional:      General: She is not in acute distress.    Appearance: She is well-developed.  HENT:     Head: Normocephalic and atraumatic.     Right Ear: External ear normal.     Left Ear: External ear normal.     Nose: Nose normal.  Eyes:     Conjunctiva/sclera: Conjunctivae normal.  Cardiovascular:     Rate and Rhythm: Normal rate and regular rhythm.     Heart sounds: No murmur heard.   Pulmonary:     Effort: Pulmonary effort is normal. No respiratory distress.     Breath sounds: Normal breath sounds.  Abdominal:     Palpations: Abdomen is soft.     Tenderness: There is no abdominal tenderness.  Musculoskeletal:     Cervical back: Neck supple.   Skin:    General: Skin is warm and dry.     Capillary Refill: Capillary refill takes less than 2 seconds.  Neurological:     Mental Status: She is alert and oriented to person, place, and time.  Psychiatric:        Mood and Affect: Mood normal.     Cranial nerves II through XII grossly intact.  No pronator drift.  No finger dysmetria.  Symmetric 5/5 strength of all extremities.  Sensation intact to light touch in all extremities.  Unremarkable unassisted gait.  ____________________________________________   LABS (all labs ordered are listed, but only abnormal results are displayed)  Labs Reviewed  COMPREHENSIVE METABOLIC PANEL - Abnormal; Notable for the following components:      Result Value   CO2 21 (*)    Creatinine, Ser 1.16 (*)    Total Bilirubin 1.4 (*)    GFR, Estimated 52 (*)    All other components within normal limits  RESP PANEL BY RT-PCR (FLU A&B, COVID) ARPGX2  PROTIME-INR  APTT  CBC  DIFFERENTIAL  CBG MONITORING, ED   ____________________________________________  EKG  Sinus rhythm with a ventricular of 99, normal axis, unremarkable intervals without evidence of acute ischemia. ____________________________________________  RADIOLOGY  ED MD interpretation: CT head remarkable for some chronic small vessel ischemia without clear acute intracranial process.  MRI brain, MRA head and MRA neck showed no evidence of acute intracranial abnormality or significant vessel stenosis or large occlusion.  There is evidence of moderate chronic microvascular ischemic changes of the white matter without any other acute process.  Official radiology report(s): MR ANGIO HEAD WO CONTRAST  Result Date: 11/09/2020 CLINICAL DATA:  Neuro deficit, acute, stroke suspected. EXAM: MRI HEAD WITHOUT CONTRAST MRA HEAD WITHOUT CONTRAST MRA NECK WITHOUT CONTRAST TECHNIQUE: Multiplanar, multi-echo pulse sequences of the brain and surrounding structures were acquired without intravenous  contrast. Angiographic images of the Circle of Willis were acquired using MRA technique without intravenous contrast. Angiographic images of the neck were acquired using MRA technique without intravenous contrast. Carotid stenosis measurements (when applicable) are obtained utilizing NASCET criteria, using the distal internal carotid diameter as the denominator. COMPARISON:  Head CT November 09, 2020.  MRI of the brain Oct 11, 2018. FINDINGS: MRI HEAD FINDINGS Brain: No acute infarction, hemorrhage, hydrocephalus, extra-axial collection or mass lesion. No pathologic intracranial enhancement. Scattered confluent foci of T2 hyperintensity are seen within the white matter of the cerebral hemispheres and within the pons, nonspecific, most likely related to chronic small vessel ischemia. 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 Vascular: Normal flow voids. Skull and upper cervical spine: Normal marrow signal. Sinuses/Orbits: Mild mucosal thickening in the left maxillary sinus. The orbits are maintained. MRA HEAD FINDINGS Anterior circulation: The visualized portions of the distal cervical and intracranial internal carotid arteries are widely patent with normal flow related enhancement. The bilateral anterior cerebral arteries and middle cerebral arteries are widely patent with antegrade flow without high-grade flow-limiting stenosis or proximal branch occlusion. No intracranial aneurysm within the anterior circulation. Posterior circulation: The vertebral arteries are widely patent with antegrade flow. The posterior inferior cerebral arteries are normal. Vertebrobasilar junction and basilar artery are widely patent with antegrade flow without evidence of basilar stenosis or aneurysm. Posterior cerebral arteries are normal bilaterally. No intracranial aneurysm within the posterior  circulation. Anatomic variants: None significant. MRA NECK FINDINGS Aortic arch: Normal 3 vessel aortic branching pattern. The visualized subclavian arteries are normal. Right carotid system: Increased tortuosity of the proximal cervical segment of the right ICA. No stenosis or evidence of dissection. Left carotid system: Increased tortuosity of the proximal cervical segment of the left ICA. No stenosis or evidence of dissection. Vertebral arteries: Codominant. Vertebral artery origins are normal. Vertebral arteries show increased tortuosity at the V2 segment with normal caliber to the vertebrobasilar confluence without stenosis or evidence of dissection. IMPRESSION: 1. No acute intracranial abnormality. 2. Moderate chronic microvascular ischemic changes of the white matter. 3. No evidence of hemodynamically significant stenosis or proximal occlusion of the main arteries of the head and neck. Electronically Signed   By: Baldemar Lenis M.D.   On: 11/09/2020 13:01   MR ANGIO NECK WO CONTRAST  Result Date: 11/09/2020 CLINICAL DATA:  Neuro deficit, acute, stroke suspected. EXAM: MRI HEAD WITHOUT CONTRAST MRA HEAD WITHOUT CONTRAST MRA NECK WITHOUT CONTRAST TECHNIQUE: Multiplanar, multi-echo pulse sequences of the brain and surrounding structures were acquired without intravenous contrast. Angiographic images of the Circle of Willis were acquired using MRA technique without intravenous contrast. Angiographic images of the neck were acquired using MRA technique without intravenous contrast. Carotid stenosis measurements (when applicable) are obtained utilizing NASCET criteria, using the distal internal carotid diameter as the denominator. COMPARISON:  Head CT November 09, 2020.  MRI of the brain Oct 11, 2018. FINDINGS: MRI HEAD FINDINGS Brain: No acute infarction, hemorrhage, hydrocephalus, extra-axial collection or mass lesion. No pathologic intracranial enhancement. Scattered confluent foci of T2  hyperintensity are seen within the white matter of the cerebral hemispheres and within the pons, nonspecific, most likely related to chronic small vessel ischemia. Elongated susceptibility artifact in the posterior left frontal lobe with surrounding T2 hyperintensity likely represent a developmental venous anomaly and is unchanged from prior MRI. Vascular: Normal flow voids. Skull and upper cervical spine: Normal marrow signal. Sinuses/Orbits: Mild mucosal thickening in the left maxillary sinus. The orbits are maintained. MRA HEAD FINDINGS Anterior circulation: The visualized portions of the distal cervical and intracranial internal carotid arteries are widely patent with normal flow related enhancement. The bilateral anterior cerebral arteries and middle cerebral arteries are widely patent with antegrade flow without high-grade flow-limiting stenosis or proximal branch occlusion. No intracranial aneurysm within the anterior circulation. Posterior circulation: The vertebral arteries are widely patent with antegrade flow. The posterior inferior cerebral arteries are normal. Vertebrobasilar junction and basilar artery are widely patent with antegrade flow without evidence of basilar stenosis or aneurysm. Posterior cerebral arteries are normal bilaterally. No intracranial aneurysm within the  posterior circulation. Anatomic variants: None significant. MRA NECK FINDINGS Aortic arch: Normal 3 vessel aortic branching pattern. The visualized subclavian arteries are normal. Right carotid system: Increased tortuosity of the proximal cervical segment of the right ICA. No stenosis or evidence of dissection. Left carotid system: Increased tortuosity of the proximal cervical segment of the left ICA. No stenosis or evidence of dissection. Vertebral arteries: Codominant. Vertebral artery origins are normal. Vertebral arteries show increased tortuosity at the V2 segment with normal caliber to the vertebrobasilar confluence without  stenosis or evidence of dissection. IMPRESSION: 1. No acute intracranial abnormality. 2. Moderate chronic microvascular ischemic changes of the white matter. 3. No evidence of hemodynamically significant stenosis or proximal occlusion of the main arteries of the head and neck. Electronically Signed   By: Baldemar LenisKatyucia  De Macedo Rodrigues M.D.   On: 11/09/2020 13:01   MR BRAIN WO CONTRAST  Result Date: 11/09/2020 CLINICAL DATA:  Neuro deficit, acute, stroke suspected. EXAM: MRI HEAD WITHOUT CONTRAST MRA HEAD WITHOUT CONTRAST MRA NECK WITHOUT CONTRAST TECHNIQUE: Multiplanar, multi-echo pulse sequences of the brain and surrounding structures were acquired without intravenous contrast. Angiographic images of the Circle of Willis were acquired using MRA technique without intravenous contrast. Angiographic images of the neck were acquired using MRA technique without intravenous contrast. Carotid stenosis measurements (when applicable) are obtained utilizing NASCET criteria, using the distal internal carotid diameter as the denominator. COMPARISON:  Head CT November 09, 2020.  MRI of the brain Oct 11, 2018. FINDINGS: MRI HEAD FINDINGS Brain: No acute infarction, hemorrhage, hydrocephalus, extra-axial collection or mass lesion. No pathologic intracranial enhancement. Scattered confluent foci of T2 hyperintensity are seen within the white matter of the cerebral hemispheres and within the pons, nonspecific, most likely related to chronic small vessel ischemia. Elongated susceptibility artifact in the posterior left frontal lobe with surrounding T2 hyperintensity likely represent a developmental venous anomaly and is unchanged from prior MRI. Vascular: Normal flow voids. Skull and upper cervical spine: Normal marrow signal. Sinuses/Orbits: Mild mucosal thickening in the left maxillary sinus. The orbits are maintained. MRA HEAD FINDINGS Anterior circulation: The visualized portions of the distal cervical and intracranial internal  carotid arteries are widely patent with normal flow related enhancement. The bilateral anterior cerebral arteries and middle cerebral arteries are widely patent with antegrade flow without high-grade flow-limiting stenosis or proximal branch occlusion. No intracranial aneurysm within the anterior circulation. Posterior circulation: The vertebral arteries are widely patent with antegrade flow. The posterior inferior cerebral arteries are normal. Vertebrobasilar junction and basilar artery are widely patent with antegrade flow without evidence of basilar stenosis or aneurysm. Posterior cerebral arteries are normal bilaterally. No intracranial aneurysm within the posterior circulation. Anatomic variants: None significant. MRA NECK FINDINGS Aortic arch: Normal 3 vessel aortic branching pattern. The visualized subclavian arteries are normal. Right carotid system: Increased tortuosity of the proximal cervical segment of the right ICA. No stenosis or evidence of dissection. Left carotid system: Increased tortuosity of the proximal cervical segment of the left ICA. No stenosis or evidence of dissection. Vertebral arteries: Codominant. Vertebral artery origins are normal. Vertebral arteries show increased tortuosity at the V2 segment with normal caliber to the vertebrobasilar confluence without stenosis or evidence of dissection. IMPRESSION: 1. No acute intracranial abnormality. 2. Moderate chronic microvascular ischemic changes of the white matter. 3. No evidence of hemodynamically significant stenosis or proximal occlusion of the main arteries of the head and neck. Electronically Signed   By: Baldemar LenisKatyucia  De Macedo Rodrigues M.D.   On: 11/09/2020 13:01  CT HEAD CODE STROKE WO CONTRAST  Result Date: 11/09/2020 CLINICAL DATA:  Code stroke.  Left-sided facial numbness EXAM: CT HEAD WITHOUT CONTRAST TECHNIQUE: Contiguous axial images were obtained from the base of the skull through the vertex without intravenous contrast.  COMPARISON:  10/11/2018 FINDINGS: Brain: No evidence of acute infarction, hemorrhage, hydrocephalus, extra-axial collection or mass lesion/mass effect. Patchy low-density in the cerebral white matter attributed to chronic small vessel ischemia, likely moderate. Vascular: No hyperdense vessel or unexpected calcification. Skull: Normal. Negative for fracture or focal lesion. Sinuses/Orbits: No acute finding. ASPECTS [facility] Stroke Program Early CT Score) - Ganglionic level infarction (caudate, lentiform nuclei, internal capsule, insula, M1-M3 cortex): 7 - Supraganglionic infarction (M4-M6 cortex): 3 Total score (0-10 with 10 being normal): 10 IMPRESSION: 1. No emergent finding. 2. Chronic small vessel ischemia in the cerebral white matter. Electronically Signed   By: Marnee Spring M.D.   On: 11/09/2020 10:51    ____________________________________________   PROCEDURES  Procedure(s) performed (including Critical Care):  .1-3 Lead EKG Interpretation Performed by: Gilles Chiquito, MD Authorized by: Gilles Chiquito, MD     Interpretation: normal     ECG rate assessment: normal     Rhythm: sinus rhythm     Ectopy: none     Conduction: normal       ____________________________________________   INITIAL IMPRESSION / ASSESSMENT AND PLAN / ED COURSE      Patient presents with above-stated history exam for assessment of some left-sided facial numbness and tingling.  On arrival she is hypertensive with a BP of 183/92 with otherwise stable vital signs on room air.  She was made code stroke in triage.  Initial differential includes CVA, atypical migraine, metabolic derangements and anxiety.  CT head is unremarkable for any acute intracranial normalities.  CMP with no significant electrode or metabolic derangements.  CBC without leukocytosis or other significant derangements.  INR and PTT unremarkable.  COVID and flu is negative.  Patient seen emergently at bedside by neurology who  recommended MRI head and neck as well as giving patient ASA.  MRI brain, MRA head and MRA neck showed no evidence of acute intracranial abnormality or significant vessel stenosis or large occlusion.  There is evidence of moderate chronic microvascular ischemic changes of the white matter without any other acute process.  Discussed with on-call neurologist Dr. Amada Jupiter who advised no additional neurological work-up indicated at this time and patient is okay from this perspective for discharge with outpatient follow-up.  He did advise patient to take daily ASA and statins treat her blood pressure.  Discussed this with patient.  She is already taking ASA.  She had previously discontinued her blood pressure medicines but she is amenable to restarting this.  Advised her she should follow-up with her PCP in the next 2 to 3 days to make sure her blood pressure gets rechecked and to see if she needs to have any additional medicines prescribed.  She is amenable to plan.  She states her tingling is much better.  Given stable vitals otherwise reassuring exam and imaging and blood work I think she is safe for discharge with outpatient follow-up.  Discharged stable condition.  Strict return precautions advised and discussed.        ____________________________________________   FINAL CLINICAL IMPRESSION(S) / ED DIAGNOSES  Final diagnoses:  Tingling  Hypertension, unspecified type    Medications  sodium chloride flush (NS) 0.9 % injection 3 mL (3 mLs Intravenous Not Given 11/09/20 1053)  aspirin chewable  tablet 324 mg (324 mg Oral Given 11/09/20 1107)     ED Discharge Orders    None       Note:  This document was prepared using Dragon voice recognition software and may include unintentional dictation errors.   Gilles Chiquito, MD 11/09/20 1318

## 2020-11-09 NOTE — ED Notes (Signed)
Code stroke called to Paulina Fusi  per Nurse Judeth Cornfield

## 2020-11-09 NOTE — ED Notes (Signed)
Pt reports on way to work an hour ago and left side of tongue, face and scalp tingling with dizziness. Denies tingling in arm or legs.  States similar episode over weekend.  Denies visual disturbances No facial droop Pt able to move all extremities Clear speech

## 2020-11-09 NOTE — ED Notes (Signed)
Pt back from MRI, reports improvement of sx but still continues to c/o tingling to left side of tongue

## 2020-11-09 NOTE — ED Notes (Signed)
Pt in MRI.

## 2020-11-09 NOTE — ED Notes (Addendum)
Neuro at bedside Dr Katrinka Blazing at bedside Pt from CT

## 2020-11-09 NOTE — ED Notes (Signed)
Pt alert and oriented, ambulatory with steady gait, NAD noted. Denies questions or concerns regarding discharge

## 2020-11-09 NOTE — ED Triage Notes (Signed)
C?O left facial and tongue numbness and tingling x 1 hour.  States had similar episode of symptoms Saturday.  AAOx3.  Skin warm and dry. MAE equally and strong. Speech clear. Facial movements equal  NAD

## 2020-11-09 NOTE — Consult Note (Signed)
Neurology Consultation Reason for Consult: Tingling Referring Physician: Michiel Sites  CC: Left-sided tingling  History is obtained from: Patient  HPI: Stacey Francis is a 65 y.o. female with a history of hypertension as well as migraine who presents with left-sided facial tingling/numbness.  She states that she was driving around 9:24 AM when she noticed the left side of her tongue was tingling.  This tingling then spread to her face over the course of a few minutes.  It has been persistent since starting.  She has also felt a vague sense of dizziness as well that started about the same time.  She also describes getting "waves" of fatigue, and feeling like she might be having an anxiety attack.  The symptoms have been static since onset.  She denies headache or photophobia.   She describes a similar event last Saturday predominantly characterized by repeated "waves" of fatigue and dizziness though she did have some tongue tingling at that time as well.   LKW: 9:30 AM tpa given?: no, mild symptoms    ROS: A 14 point ROS was performed and is negative except as noted in the HPI.   Past Medical History:  Diagnosis Date  . Hypertension   . Sinus infection      No family history on file.   Social History:  reports that she has never smoked. She has never used smokeless tobacco. She reports that she does not drink alcohol and does not use drugs.   Exam: Current vital signs: BP (!) 183/92 (BP Location: Right Arm)   Pulse 92   Temp 97.8 F (36.6 C) (Oral)   Resp 16   Ht 5\' 3"  (1.6 m)   Wt 59.3 kg   SpO2 100%   BMI 23.16 kg/m  Vital signs in last 24 hours: Temp:  [97.8 F (36.6 C)] 97.8 F (36.6 C) (06/07 1039) Pulse Rate:  [92] 92 (06/07 1039) Resp:  [16] 16 (06/07 1039) BP: (183)/(92) 183/92 (06/07 1039) SpO2:  [100 %] 100 % (06/07 1050) Weight:  [59.3 kg] 59.3 kg (06/07 1056)   Physical Exam  Constitutional: Appears well-developed and well-nourished.  Psych: Affect  appropriate to situation Eyes: No scleral injection HENT: No OP obstruction MSK: no joint deformities.  Cardiovascular: Normal rate and regular rhythm.  Respiratory: Effort normal, non-labored breathing GI: Soft.  No distension. There is no tenderness.  Skin: WDI  Neuro: Mental Status: Patient is awake, alert, oriented to person, place, month, year, and situation. Patient is able to give a clear and coherent history. No signs of aphasia or neglect Cranial Nerves: II: Visual Fields are full. Pupils are equal, round, and reactive to light.   III,IV, VI: EOMI without ptosis or diploplia.  V: Facial sensation is symmetric to temperature VII: Facial movement is symmetric.  VIII: hearing is intact to voice X: Uvula elevates symmetrically XI: Shoulder shrug is symmetric. XII: tongue is midline without atrophy or fasciculations.  Motor: Tone is normal. Bulk is normal. 5/5 strength was present in all four extremities.  Sensory: Sensation is symmetric to pinprick in the arms and legs. Plantars: Toes are downgoing bilaterally.  Cerebellar: FNF and HKS are intact bilaterally   I have reviewed labs in epic and the results pertinent to this consultation are: Cr 1.16  I have reviewed the images obtained: CT-head-unremarkable  Impression: 65 year old female with facial paresthesia without clear negative deficits.  The slow spread and description of "waves of fatigue" are unusual symptoms of cerebrovascular disease.  I do  think further imaging to rule out thalamic ischemia is necessary, but if this is negative with persistent deficits, I do not think I would pursue treating this as TIA/stroke.  Another possibility would be migraine aura without headache, given that she does have a history of migraines and some of these symptoms sound more consistent with migraine than stroke.  Recommendations: 1) MRI brain, MRA head and neck 2) further recommendations following the above  imaging.   Ritta Slot, MD [facility] [phone]  If 7pm- 7am, please page neurology on call as listed in [facility].

## 2020-11-24 ENCOUNTER — Other Ambulatory Visit: Payer: Self-pay | Admitting: Pediatrics

## 2020-11-24 DIAGNOSIS — Z13820 Encounter for screening for osteoporosis: Secondary | ICD-10-CM

## 2020-11-24 DIAGNOSIS — Z1231 Encounter for screening mammogram for malignant neoplasm of breast: Secondary | ICD-10-CM

## 2021-01-12 ENCOUNTER — Emergency Department: Payer: Medicare Other

## 2021-01-12 ENCOUNTER — Other Ambulatory Visit: Payer: Self-pay

## 2021-01-12 ENCOUNTER — Emergency Department
Admission: EM | Admit: 2021-01-12 | Discharge: 2021-01-12 | Disposition: A | Discharge status: Home / Self Care | Payer: Medicare Other | Attending: Emergency Medicine | Admitting: Emergency Medicine

## 2021-01-12 DIAGNOSIS — R0789 Other chest pain: Secondary | ICD-10-CM | POA: Insufficient documentation

## 2021-01-12 DIAGNOSIS — Z5321 Procedure and treatment not carried out due to patient leaving prior to being seen by health care provider: Secondary | ICD-10-CM | POA: Insufficient documentation

## 2021-01-12 DIAGNOSIS — R519 Headache, unspecified: Secondary | ICD-10-CM | POA: Diagnosis present

## 2021-01-12 DIAGNOSIS — I10 Essential (primary) hypertension: Secondary | ICD-10-CM | POA: Diagnosis not present

## 2021-01-12 DIAGNOSIS — M542 Cervicalgia: Secondary | ICD-10-CM | POA: Diagnosis not present

## 2021-01-12 LAB — BASIC METABOLIC PANEL
Anion gap: 8 (ref 5–15)
BUN: 14 mg/dL (ref 8–23)
CO2: 26 mmol/L (ref 22–32)
Calcium: 9.4 mg/dL (ref 8.9–10.3)
Chloride: 105 mmol/L (ref 98–111)
Creatinine, Ser: 0.97 mg/dL (ref 0.44–1.00)
GFR, Estimated: >60 mL/min (ref >60)
Glucose, Bld: 97 mg/dL (ref 70–99)
Potassium: 4 mmol/L (ref 3.5–5.1)
Sodium: 139 mmol/L (ref 135–145)

## 2021-01-12 LAB — CBC
HCT: 39.8 % (ref 36.0–46.0)
Hemoglobin: 13.5 g/dL (ref 12.0–15.0)
MCH: 31 pg (ref 26.0–34.0)
MCHC: 33.9 g/dL (ref 30.0–36.0)
MCV: 91.5 fL (ref 80.0–100.0)
Platelets: 210 10*3/uL (ref 150–400)
RBC: 4.35 MIL/uL (ref 3.87–5.11)
RDW: 12.2 % (ref 11.5–15.5)
WBC: 7.3 10*3/uL (ref 4.0–10.5)
nRBC: 0 % (ref 0.0–0.2)

## 2021-01-12 LAB — TROPONIN I (HIGH SENSITIVITY): Troponin I (High Sensitivity): 3 ng/L (ref <18)

## 2021-01-12 NOTE — ED Notes (Signed)
Patient discussed with Dr. Katrinka Blazing. Patient HTN. CT head ordered by DR. Smith at this time.

## 2021-01-12 NOTE — ED Notes (Signed)
Patient transported to X-ray 

## 2021-01-12 NOTE — ED Notes (Signed)
Pt to first nurse desk stating that she is going to go home because she feels better, pt informed to come back if she begins to feel worse. Pt confirms understating, pt does not appear to be in any distress and is ambulatory exiting the emergency room.

## 2021-01-12 NOTE — ED Triage Notes (Signed)
Patient reports headache x 2 days, and HTN xtoday. Patient also reports "burning" in her chest and into her neck. Patient denies SOB. Patient reports headache continues at this time.

## 2021-04-30 ENCOUNTER — Other Ambulatory Visit: Payer: Self-pay

## 2021-04-30 ENCOUNTER — Emergency Department: Payer: Medicare Other

## 2021-04-30 ENCOUNTER — Emergency Department
Admission: EM | Admit: 2021-04-30 | Discharge: 2021-04-30 | Discharge status: Absent without leave | Payer: Medicare Other | Source: Home / Self Care

## 2021-04-30 ENCOUNTER — Encounter: Payer: Self-pay | Admitting: Emergency Medicine

## 2021-04-30 DIAGNOSIS — W540XXA Bitten by dog, initial encounter: Secondary | ICD-10-CM | POA: Diagnosis not present

## 2021-04-30 DIAGNOSIS — Z23 Encounter for immunization: Secondary | ICD-10-CM | POA: Insufficient documentation

## 2021-04-30 DIAGNOSIS — S81811A Laceration without foreign body, right lower leg, initial encounter: Secondary | ICD-10-CM | POA: Diagnosis not present

## 2021-04-30 DIAGNOSIS — S6991XA Unspecified injury of right wrist, hand and finger(s), initial encounter: Secondary | ICD-10-CM | POA: Diagnosis present

## 2021-04-30 DIAGNOSIS — I10 Essential (primary) hypertension: Secondary | ICD-10-CM | POA: Insufficient documentation

## 2021-04-30 NOTE — ED Notes (Signed)
Pt decided to leave due to wait times. Pt ambulatory without difficulty.

## 2021-04-30 NOTE — ED Provider Notes (Signed)
HPI: Pt is a 65 y.o. female who presents with complaints of dog bite on right leg. Occurred earlier today. She was trying to break up a fight between two dogs when it occurred. Pain is resolving, but still 8/10. She states dogs are both up to date on all immunizations.   ROS: Denies fever, chest pain, vomiting  Past Medical History:  Diagnosis Date   Hypertension    Sinus infection    There were no vitals filed for this visit.  Focused Physical Exam: Gen: No acute distress Head: atraumatic, normocephalic Eyes: Extraocular movements grossly intact; conjunctiva clear CV: RRR Lung: No increased WOB, no stridor GI: ND, no obvious masses Neuro: Alert and awake  Triangular laceration to the medial aspect of the left tibia. Puncture wound along the lateral side.   Medical Decision Making and Plan: Given the patient's initial medical screening exam, the following diagnostic evaluation has been ordered. The patient will be placed in the appropriate treatment space, once one is available, to complete the evaluation and treatment. I have discussed the plan of care with the patient and I have advised the patient that an ED physician or mid-level practitioner will reevaluate their condition after the test results have been received, as the results may give them additional insight into the type of treatment they may need.   Diagnostics: X-ray right tibia/fibula  Treatments: none immediately   Varney Daily, [place] 04/30/21 1850    Phineas Semen, MD 04/30/21 1958

## 2021-04-30 NOTE — ED Triage Notes (Signed)
Pt via POV from home. Pt was breaking up a fight between her 2 dogs. Shots are updated. Unknown when last tetanus was. Pt had puncture wounds to the R calf and R shin. Bleeding controlled. Pt is A&Ox4 and NAD.

## 2021-05-01 ENCOUNTER — Emergency Department
Admission: EM | Admit: 2021-05-01 | Discharge: 2021-05-01 | Disposition: A | Discharge status: Home / Self Care | Payer: Medicare Other | Attending: Emergency Medicine | Admitting: Emergency Medicine

## 2021-05-01 DIAGNOSIS — W540XXA Bitten by dog, initial encounter: Secondary | ICD-10-CM

## 2021-05-01 MED ORDER — CLINDAMYCIN HCL 300 MG PO CAPS: 300.0000 mg | ORAL_CAPSULE | Freq: Three times a day (TID) | ORAL | 0 refills | Status: AC

## 2021-05-01 MED ORDER — BACITRACIN 500 UNIT/GM EX OINT: 1.0000 "application " | TOPICAL_OINTMENT | Freq: Two times a day (BID) | CUTANEOUS | 0 refills | Status: DC

## 2021-05-01 MED ORDER — TETANUS-DIPHTH-ACELL PERTUSSIS 5-2.5-18.5 LF-MCG/0.5 IM SUSY
0.5000 mL | PREFILLED_SYRINGE | Freq: Once | INTRAMUSCULAR | Status: AC
  Administered 2021-05-01: 02:00:00 0.5 mL via INTRAMUSCULAR
  Filled 2021-05-01: qty 0.5

## 2021-05-01 MED ORDER — CLINDAMYCIN HCL 150 MG PO CAPS
300.0000 mg | ORAL_CAPSULE | Freq: Once | ORAL | Status: AC
  Administered 2021-05-01: 02:00:00 300 mg via ORAL
  Filled 2021-05-01: qty 2

## 2021-05-01 MED ORDER — ACETAMINOPHEN 500 MG PO TABS
1000.0000 mg | ORAL_TABLET | Freq: Once | ORAL | Status: AC
  Administered 2021-05-01: 02:00:00 1000 mg via ORAL
  Filled 2021-05-01: qty 2

## 2021-05-01 NOTE — ED Provider Notes (Signed)
[facility] Emergency Department Provider Note  ____________________________________________  Time seen: Approximately 2:13 AM  I have reviewed the triage vital signs and the nursing notes.   HISTORY  Chief Complaint Animal Bite   HPI Stacey Francis is a 65 y.o. female with history of hypertension presents for evaluation of a dog bite.  Patient reports that she was trying to separate her 2 dogs that were fighting when she got bitten in her right lower extremity.  She does not remember when her last tetanus shot is.  Her dogs are fully vaccinated.  She has sustained 2 lacerations to the right lower extremity.  She is complaining of 8 out of 10 and sharp throbbing pain that has been constant since the accident happened over 10 hours ago.   Past Medical History:  Diagnosis Date   Hypertension    Sinus infection     There are no problems to display for this patient.   History reviewed. No pertinent surgical history.  Prior to Admission medications   Medication Sig Start Date End Date Taking? Authorizing Provider  bacitracin 500 UNIT/GM ointment Apply 1 application topically 2 (two) times daily. 05/01/21  Yes Saory Carriero, [place], MD  clindamycin (CLEOCIN) 300 MG capsule Take 1 capsule (300 mg total) by mouth 3 (three) times daily for 7 days. 05/01/21 05/08/21 Yes Brandi Armato, [place], MD  amLODipine (NORVASC) 5 MG tablet Take 1 tablet (5 mg total) by mouth daily. 10/24/18 10/24/19  Willy Eddy, MD  cyproheptadine (PERIACTIN) 4 MG tablet Take 1 tablet (4 mg total) by mouth 3 (three) times daily as needed for allergies. 12/26/17   Menshew, Charlesetta Ivory, PA-C  predniSONE (STERAPRED UNI-PAK 21 TAB) 10 MG (21) TBPK tablet Take by mouth daily. Dispense steroid taper pack as directed 12/19/17   Emily Filbert, MD  ranitidine (ZANTAC) 150 MG tablet Take 1 tablet (150 mg total) by mouth 2 (two) times daily. 12/26/17   Menshew, Charlesetta Ivory, PA-C     Allergies Penicillins  History reviewed. No pertinent family history.  Social History Social History   Tobacco Use   Smoking status: Never   Smokeless tobacco: Never  Vaping Use   Vaping Use: Never used  Substance Use Topics   Alcohol use: No   Drug use: No    Review of Systems  Constitutional: Negative for fever. Eyes: Negative for visual changes. ENT: Negative for sore throat. Neck: No neck pain  Cardiovascular: Negative for chest pain. Respiratory: Negative for shortness of breath. Gastrointestinal: Negative for abdominal pain, vomiting or diarrhea. Genitourinary: Negative for dysuria. Musculoskeletal: Negative for back pain. Skin: Negative for rash. + dog bite to the leg Neurological: Negative for headaches, weakness or numbness. Psych: No SI or HI  ____________________________________________   PHYSICAL EXAM:  VITAL SIGNS: Vitals:   04/30/21 2310 05/01/21 0212  BP: (!) 141/85 (!) 175/84  Pulse: 91 91  Resp: 18 20  Temp: 98.6 F (37 C) 97.6 F (36.4 C)  SpO2: 98% 100%     Constitutional: Alert and oriented. Well appearing and in no apparent distress. HEENT:      Head: Normocephalic and atraumatic.         Eyes: Conjunctivae are normal. Sclera is non-icteric.       Mouth/Throat: Mucous membranes are moist.       Neck: Supple with no signs of meningismus. Cardiovascular: Regular rate and rhythm.  Respiratory: Normal respiratory effort. Gastrointestinal: Soft, non tender. Musculoskeletal:  There are two flap like  bite/ lacerations to the R lower leg, no active bleeding. Neurologic: Normal speech and language. Face is symmetric. Moving all extremities. No gross focal neurologic deficits are appreciated. Skin: Skin is warm, dry and intact. No rash noted. Psychiatric: Mood and affect are normal. Speech and behavior are normal.  ____________________________________________   LABS (all labs ordered are listed, but only abnormal results are  displayed)  Labs Reviewed - No data to display ____________________________________________  EKG  none  ____________________________________________  RADIOLOGY  I have personally reviewed the images performed during this visit and I agree with the Radiologist's read.   Interpretation by Radiologist:  DG Tibia/Fibula Right  Result Date: 04/30/2021 CLINICAL DATA:  Status post trauma. EXAM: RIGHT TIBIA AND FIBULA - 2 VIEW COMPARISON:  None. FINDINGS: There is no evidence of fracture or other focal bone lesions. Very mild soft tissue swelling is seen along the anterior aspect of the distal right tibial shaft. IMPRESSION: No acute osseous abnormality. Electronically Signed   By: Aram Candela M.D.   On: 04/30/2021 19:23     ____________________________________________   PROCEDURES  Procedure(s) performed: None Procedures   Critical Care performed: none ____________________________________________   INITIAL IMPRESSION / ASSESSMENT AND PLAN / ED COURSE   65 y.o. female with history of hypertension presents for evaluation of a dog bite.  Patient has 2 flap-like puncture/bites to the right lower extremity with no active bleeding, no bone or tendon exposure.  The wounds have been there for almost 10 hours therefore we will allow it to heal by secondary intention.  Wounds were washed thoroughly with saline and iodine.  Wound was dressed appropriately with sterile dressing.  Tetanus booster was given.  Patient was started on clindamycin.  Discussed wound care with patient.  We will send her home on 7-day course of clinda.  Discussed return precautions for any signs of infection and close follow-up with primary care doctor for reevaluation of the wound.  No indication for rabies vaccines or immunoglobulin since dogs belong to the patient and fully vaccinated      _____________________________________________ Please note:  Patient was evaluated in Emergency Department today for the  symptoms described in the history of present illness. Patient was evaluated in the context of the global COVID-19 pandemic, which necessitated consideration that the patient might be at risk for infection with the SARS-CoV-2 virus that causes COVID-19. Institutional protocols and algorithms that pertain to the evaluation of patients at risk for COVID-19 are in a state of rapid change based on information released by regulatory bodies including the CDC and federal and state organizations. These policies and algorithms were followed during the patient's care in the ED.  Some ED evaluations and interventions may be delayed as a result of limited staffing during the pandemic.   [place] Controlled Substance Database was reviewed by me. ____________________________________________   FINAL CLINICAL IMPRESSION(S) / ED DIAGNOSES   Final diagnoses:  Dog bite, initial encounter      NEW MEDICATIONS STARTED DURING THIS VISIT:  ED Discharge Orders          Ordered    clindamycin (CLEOCIN) 300 MG capsule  3 times daily        05/01/21 0215    bacitracin 500 UNIT/GM ointment  2 times daily        05/01/21 0215             Note:  This document was prepared using Dragon voice recognition software and may include unintentional dictation errors.  Nita Sickle, MD 05/01/21 Emeline Darling

## 2021-05-01 NOTE — Discharge Instructions (Addendum)
Wash the wound with warm water and soap. Cover with bacitracin and keep it cover until it heals. Follow up with your doctor in 3 days for re-evaluation of the wound. Take antibiotics as prescribed. Return to the ER for redness or warmth of the skin surrounding the bites, pus or fever.

## 2021-06-16 DIAGNOSIS — Z124 Encounter for screening for malignant neoplasm of cervix: Secondary | ICD-10-CM | POA: Diagnosis not present

## 2021-06-16 DIAGNOSIS — E039 Hypothyroidism, unspecified: Secondary | ICD-10-CM | POA: Diagnosis not present

## 2021-06-16 DIAGNOSIS — Z01419 Encounter for gynecological examination (general) (routine) without abnormal findings: Secondary | ICD-10-CM | POA: Diagnosis not present

## 2021-06-21 DIAGNOSIS — M9906 Segmental and somatic dysfunction of lower extremity: Secondary | ICD-10-CM | POA: Diagnosis not present

## 2021-06-21 DIAGNOSIS — M9901 Segmental and somatic dysfunction of cervical region: Secondary | ICD-10-CM | POA: Diagnosis not present

## 2021-06-21 DIAGNOSIS — M9902 Segmental and somatic dysfunction of thoracic region: Secondary | ICD-10-CM | POA: Diagnosis not present

## 2021-06-21 DIAGNOSIS — M9903 Segmental and somatic dysfunction of lumbar region: Secondary | ICD-10-CM | POA: Diagnosis not present

## 2021-06-27 ENCOUNTER — Other Ambulatory Visit: Payer: Self-pay | Admitting: Physician Assistant

## 2021-06-27 DIAGNOSIS — Z1231 Encounter for screening mammogram for malignant neoplasm of breast: Secondary | ICD-10-CM

## 2021-06-27 DIAGNOSIS — Z13820 Encounter for screening for osteoporosis: Secondary | ICD-10-CM

## 2021-07-11 DIAGNOSIS — M9906 Segmental and somatic dysfunction of lower extremity: Secondary | ICD-10-CM | POA: Diagnosis not present

## 2021-07-11 DIAGNOSIS — M9903 Segmental and somatic dysfunction of lumbar region: Secondary | ICD-10-CM | POA: Diagnosis not present

## 2021-07-11 DIAGNOSIS — M9901 Segmental and somatic dysfunction of cervical region: Secondary | ICD-10-CM | POA: Diagnosis not present

## 2021-07-11 DIAGNOSIS — M9902 Segmental and somatic dysfunction of thoracic region: Secondary | ICD-10-CM | POA: Diagnosis not present

## 2021-07-13 DIAGNOSIS — M9901 Segmental and somatic dysfunction of cervical region: Secondary | ICD-10-CM | POA: Diagnosis not present

## 2021-07-13 DIAGNOSIS — M9906 Segmental and somatic dysfunction of lower extremity: Secondary | ICD-10-CM | POA: Diagnosis not present

## 2021-07-13 DIAGNOSIS — M9903 Segmental and somatic dysfunction of lumbar region: Secondary | ICD-10-CM | POA: Diagnosis not present

## 2021-07-13 DIAGNOSIS — M9902 Segmental and somatic dysfunction of thoracic region: Secondary | ICD-10-CM | POA: Diagnosis not present

## 2021-07-18 DIAGNOSIS — M9902 Segmental and somatic dysfunction of thoracic region: Secondary | ICD-10-CM | POA: Diagnosis not present

## 2021-07-18 DIAGNOSIS — M9906 Segmental and somatic dysfunction of lower extremity: Secondary | ICD-10-CM | POA: Diagnosis not present

## 2021-07-18 DIAGNOSIS — M9901 Segmental and somatic dysfunction of cervical region: Secondary | ICD-10-CM | POA: Diagnosis not present

## 2021-07-18 DIAGNOSIS — M9903 Segmental and somatic dysfunction of lumbar region: Secondary | ICD-10-CM | POA: Diagnosis not present

## 2021-07-20 DIAGNOSIS — M9901 Segmental and somatic dysfunction of cervical region: Secondary | ICD-10-CM | POA: Diagnosis not present

## 2021-07-20 DIAGNOSIS — M9906 Segmental and somatic dysfunction of lower extremity: Secondary | ICD-10-CM | POA: Diagnosis not present

## 2021-07-20 DIAGNOSIS — M9903 Segmental and somatic dysfunction of lumbar region: Secondary | ICD-10-CM | POA: Diagnosis not present

## 2021-07-20 DIAGNOSIS — M9902 Segmental and somatic dysfunction of thoracic region: Secondary | ICD-10-CM | POA: Diagnosis not present

## 2021-07-25 DIAGNOSIS — I10 Essential (primary) hypertension: Secondary | ICD-10-CM | POA: Diagnosis not present

## 2021-07-25 DIAGNOSIS — M9901 Segmental and somatic dysfunction of cervical region: Secondary | ICD-10-CM | POA: Diagnosis not present

## 2021-07-25 DIAGNOSIS — R0789 Other chest pain: Secondary | ICD-10-CM | POA: Diagnosis not present

## 2021-07-25 DIAGNOSIS — E782 Mixed hyperlipidemia: Secondary | ICD-10-CM | POA: Diagnosis not present

## 2021-07-25 DIAGNOSIS — M9903 Segmental and somatic dysfunction of lumbar region: Secondary | ICD-10-CM | POA: Diagnosis not present

## 2021-07-25 DIAGNOSIS — I6789 Other cerebrovascular disease: Secondary | ICD-10-CM | POA: Diagnosis not present

## 2021-07-25 DIAGNOSIS — M9902 Segmental and somatic dysfunction of thoracic region: Secondary | ICD-10-CM | POA: Diagnosis not present

## 2021-07-25 DIAGNOSIS — M9906 Segmental and somatic dysfunction of lower extremity: Secondary | ICD-10-CM | POA: Diagnosis not present

## 2021-08-25 DIAGNOSIS — R0789 Other chest pain: Secondary | ICD-10-CM | POA: Diagnosis not present

## 2021-09-01 DIAGNOSIS — I6789 Other cerebrovascular disease: Secondary | ICD-10-CM | POA: Diagnosis not present

## 2021-09-01 DIAGNOSIS — E782 Mixed hyperlipidemia: Secondary | ICD-10-CM | POA: Diagnosis not present

## 2021-09-01 DIAGNOSIS — I10 Essential (primary) hypertension: Secondary | ICD-10-CM | POA: Diagnosis not present

## 2022-01-01 ENCOUNTER — Other Ambulatory Visit: Payer: Self-pay

## 2022-01-01 ENCOUNTER — Emergency Department
Admission: EM | Admit: 2022-01-01 | Discharge: 2022-01-01 | Disposition: A | Discharge status: Home / Self Care | Payer: No Typology Code available for payment source | Attending: Emergency Medicine | Admitting: Emergency Medicine

## 2022-01-01 ENCOUNTER — Emergency Department: Payer: No Typology Code available for payment source

## 2022-01-01 DIAGNOSIS — R42 Dizziness and giddiness: Secondary | ICD-10-CM | POA: Diagnosis not present

## 2022-01-01 DIAGNOSIS — R002 Palpitations: Secondary | ICD-10-CM | POA: Diagnosis not present

## 2022-01-01 DIAGNOSIS — R5381 Other malaise: Secondary | ICD-10-CM | POA: Diagnosis not present

## 2022-01-01 DIAGNOSIS — I10 Essential (primary) hypertension: Secondary | ICD-10-CM | POA: Insufficient documentation

## 2022-01-01 DIAGNOSIS — R5383 Other fatigue: Secondary | ICD-10-CM | POA: Diagnosis not present

## 2022-01-01 DIAGNOSIS — R079 Chest pain, unspecified: Secondary | ICD-10-CM | POA: Diagnosis not present

## 2022-01-01 LAB — URINALYSIS, ROUTINE W REFLEX MICROSCOPIC
Bacteria, UA: NONE SEEN
Bilirubin Urine: NEGATIVE
Glucose, UA: NEGATIVE mg/dL
Hgb urine dipstick: NEGATIVE
Ketones, ur: NEGATIVE mg/dL
Nitrite: NEGATIVE
Protein, ur: NEGATIVE mg/dL
Specific Gravity, Urine: 1.008 (ref 1.005–1.030)
pH: 6 (ref 5.0–8.0)

## 2022-01-01 LAB — CBC
HCT: 40.1 % (ref 36.0–46.0)
Hemoglobin: 13.2 g/dL (ref 12.0–15.0)
MCH: 29.3 pg (ref 26.0–34.0)
MCHC: 32.9 g/dL (ref 30.0–36.0)
MCV: 88.9 fL (ref 80.0–100.0)
Platelets: 229 10*3/uL (ref 150–400)
RBC: 4.51 MIL/uL (ref 3.87–5.11)
RDW: 12 % (ref 11.5–15.5)
WBC: 5.8 10*3/uL (ref 4.0–10.5)
nRBC: 0 % (ref 0.0–0.2)

## 2022-01-01 LAB — COMPREHENSIVE METABOLIC PANEL
ALT: 16 U/L (ref 0–44)
AST: 17 U/L (ref 15–41)
Albumin: 4.3 g/dL (ref 3.5–5.0)
Alkaline Phosphatase: 44 U/L (ref 38–126)
Anion gap: 8 (ref 5–15)
BUN: 10 mg/dL (ref 8–23)
CO2: 24 mmol/L (ref 22–32)
Calcium: 9.3 mg/dL (ref 8.9–10.3)
Chloride: 104 mmol/L (ref 98–111)
Creatinine, Ser: 0.9 mg/dL (ref 0.44–1.00)
GFR, Estimated: >60 mL/min (ref >60)
Glucose, Bld: 107 mg/dL — ABNORMAL HIGH (ref 70–99)
Potassium: 3.9 mmol/L (ref 3.5–5.1)
Sodium: 136 mmol/L (ref 135–145)
Total Bilirubin: 1.5 mg/dL — ABNORMAL HIGH (ref 0.3–1.2)
Total Protein: 7.5 g/dL (ref 6.5–8.1)

## 2022-01-01 LAB — TSH: TSH: 4.571 u[IU]/mL — ABNORMAL HIGH (ref 0.350–4.500)

## 2022-01-01 LAB — TROPONIN I (HIGH SENSITIVITY)
Troponin I (High Sensitivity): <2 ng/L (ref <18)
Troponin I (High Sensitivity): 12 ng/L (ref <18)

## 2022-01-01 LAB — T4, FREE: Free T4: 0.84 ng/dL (ref 0.61–1.12)

## 2022-01-01 MED ORDER — SODIUM CHLORIDE 0.9 % IV BOLUS
1000.0000 mL | Freq: Once | INTRAVENOUS | Status: AC
  Administered 2022-01-01: 1000 mL via INTRAVENOUS

## 2022-01-01 MED ORDER — IOHEXOL 350 MG/ML SOLN
100.0000 mL | Freq: Once | INTRAVENOUS | Status: AC | PRN
  Administered 2022-01-01: 75 mL via INTRAVENOUS

## 2022-01-01 NOTE — ED Notes (Signed)
Pt up to bedside toilet.  

## 2022-01-01 NOTE — ED Notes (Signed)
Called lab about urine results who states they should have results posted in about 5 minutes

## 2022-01-01 NOTE — ED Provider Notes (Signed)
I assumed care of this patient approximately 1500.  Please see outgoing providers note for full he is regarding patient's initial evaluation assessment.  In brief patient presents for evaluation of tachycardia she noted today and palpitations.  They have since resolved.  At time of signout she is pending a UA and she has already had fairly reassuring work-up including a CT dissection study is unremarkable with exception of some aortic atherosclerosis.  EKG and nonelevated troponin x2 is not suggestive of ACS.  CMP without any significant lecture light or metabolic derangements.  CBC without leukocytosis or acute anemia.  TSH is slightly above upper normal but free T4 is WNL.  UA is unremarkable.  Patient will follow-up with her cardiologist and PCP.   Gilles Chiquito, MD 01/01/22 [phone]

## 2022-01-01 NOTE — ED Provider Notes (Signed)
[facility] Provider Note    Event Date/Time   First MD Initiated Contact with Patient 01/01/22 1145     (approximate)   History   Dizziness   HPI  Stacey Francis is a 66 y.o. female who on review of cardiology note by Dr. Gwen Pounds from March has a history of hypertension previous normal stress testing and peripheral vascular disease  About a week ago had an episode of a sudden somewhat unexplained severe pain around the top of her right shoulder blade that then radiated across her upper back towards the left side.  It is since subsided and gone away completely  After she has been feeling fatigued Kho, and noticing that she has been feeling a bit lightheaded with walking for the last several days.  She went to the IV clinic and received a liter of fluid and felt better briefly, but now she reports that she continues to feel very lightheaded when she stands or walks.  For instance, earlier today when she stood up from a chair she noticed that her heart was racing.  Her heart rate monitor showed her heart rate had increased to about 140, but has since come back down.  She felt palpitations at the time.  Those have since resolved now.    No ongoing pain or symptoms at this time.  No abdominal pain no diarrhea no nausea or vomiting.  No urinary symptoms.  No headache     Physical Exam   Triage Vital Signs: ED Triage Vitals  Enc Vitals Group     BP 01/01/22 1138 (!) 145/69     Pulse Rate 01/01/22 1138 96     Resp 01/01/22 1138 18     Temp 01/01/22 1141 98.9 F (37.2 C)     Temp Source 01/01/22 1141 Oral     SpO2 01/01/22 1138 100 %     Weight 01/01/22 1139 118 lb (53.5 kg)     Height 01/01/22 1139 5\' 3"  (1.6 m)     Head Circumference --      Peak Flow --      Pain Score 01/01/22 1139 0     Pain Loc --      Pain Edu? --      Excl. in GC? --     Most recent vital signs: Vitals:   01/01/22 1230 01/01/22 1300  BP: (!) 165/72 (!) 163/69  Pulse: 84  81  Resp: (!) 21 15  Temp:    SpO2: 100% 100%     General: Awake, no distress.  Very pleasant.  Resting comfortably CV:  Good peripheral perfusion.  Normal heart tones, perhaps it just slightly prominent S2.  Normal rate and rhythm.  Heart rate approximately 100 sinus Resp:  Normal effort.  Clear bilaterally.  Speaks in full clear sentences Abd:  No distention.  Soft nontender nondistended throughout.  Negative Murphy Other:  Warm well perfused all extremities.  Skin examination the upper back shows no areas of tenderness pain or lesion.  Full range of motion of the neck without pain  Equal smile.  Clear speech.  No pronator drift in any extremity.  Normal muscle strength all extremities   ED Results / Procedures / Treatments   Labs (all labs ordered are listed, but only abnormal results are displayed) Labs Reviewed  COMPREHENSIVE METABOLIC PANEL - Abnormal; Notable for the following components:      Result Value   Glucose, Bld 107 (*)    Total  Bilirubin 1.5 (*)    All other components within normal limits  TSH - Abnormal; Notable for the following components:   TSH 4.571 (*)    All other components within normal limits  CBC  T4, FREE  URINALYSIS, ROUTINE W REFLEX MICROSCOPIC  TROPONIN I (HIGH SENSITIVITY)  TROPONIN I (HIGH SENSITIVITY)     EKG  Interpreted by me at 1143 heart rate 100 QRS 90 QTc 550 Normal sinus rhythm no evidence of acute ischemia or ectopy   RADIOLOGY  CT Angio Chest Aorta W and/or Wo Contrast  Result Date: 01/01/2022 CLINICAL DATA:  Severe chest and back pain for several days. Lightheadedness. History of aortic aneurysm. EXAM: CT ANGIOGRAPHY CHEST WITH CONTRAST TECHNIQUE: Multidetector CT imaging of the chest was performed using the standard protocol during bolus administration of intravenous contrast. Multiplanar CT image reconstructions and MIPs were obtained to evaluate the vascular anatomy. RADIATION DOSE REDUCTION: This exam was performed  according to the departmental dose-optimization program which includes automated exposure control, adjustment of the mA and/or kV according to patient size and/or use of iterative reconstruction technique. CONTRAST:  27mL OMNIPAQUE IOHEXOL 350 MG/ML SOLN COMPARISON:  None Available. FINDINGS: Cardiovascular: No evidence of thoracic aortic dissection or aneurysm. Mild atherosclerotic calcification of the thoracic aorta noted. No evidence of pulmonary embolism. Mediastinum/Nodes: No evidence of mediastinal hematoma. No masses or pathologically enlarged lymph nodes identified. Lungs/Pleura: No pulmonary mass, infiltrate, or effusion. Upper abdomen: No acute findings. Musculoskeletal: No suspicious bone lesions identified. Review of the MIP images confirms the above findings. IMPRESSION: No acute findings. No evidence of thoracic aortic aneurysm, dissection, or pulmonary embolism. Aortic Atherosclerosis (ICD10-I70.0). Electronically Signed   By: Danae Orleans M.D.   On: 01/01/2022 14:14    CT imaging of the chest negative for dissection.   PROCEDURES:  Critical Care performed: No  Procedures   MEDICATIONS ORDERED IN ED: Medications  sodium chloride 0.9 % bolus 1,000 mL (0 mLs Intravenous Stopped 01/01/22 1312)  iohexol (OMNIPAQUE) 350 MG/ML injection 100 mL (75 mLs Intravenous Contrast Given 01/01/22 1340)     IMPRESSION / MDM / ASSESSMENT AND PLAN / ED COURSE  I reviewed the triage vital signs and the nursing notes.                              Differential diagnosis includes, but is not limited to, dehydration, referred pain from the chest towards her upper back, aortic dissection, pulmonary embolism, ACS, anemia, arrhythmia such as SVT, hypothyroidism etc.  Very reassuring examination at the time of my evaluation in the ER but she does report symptoms including palpitations fatigue and then this was all preceded by a fairly severe but brief episode of upper back pain which is now  resolved.    Patient's presentation is most consistent with acute illness / injury with system symptoms.  The patient is on the cardiac monitor to evaluate for evidence of arrhythmia and/or significant heart rate changes.  Labs reviewed normal CBC and metabolic panel.  TSH slightly elevated but free T4 is normal.  Urinalysis pending at time of signout as well as repeat troponin, Dr. Michiel Sites to follow-up  ----------------------------------------- 4:15 PM on 01/01/2022 ----------------------------------------- Thus far work-up reassuring.  Patient receiving IV fluids, and thus far her cardiac work-up as well as CT chest normal.  Labs thus far CBC metabolic panel within normal limits.  Ongoing care assigned to Dr. Katrinka Blazing, urinalysis and troponin pending, if normal  would reassess in suspect appropriate disposition may be discharged with outpatient follow-up for further work-up for lightheadedness     FINAL CLINICAL IMPRESSION(S) / ED DIAGNOSES   Final diagnoses:  Lightheadedness  Palpitations     Rx / DC Orders   ED Discharge Orders     None        Note:  This document was prepared using Dragon voice recognition software and may include unintentional dictation errors.   Sharyn Creamer, MD 01/01/22 1616

## 2022-01-01 NOTE — ED Triage Notes (Signed)
Pt arrives via EMS from home for not feeling well- pt has been feeling lightheaded and fatigued- this is the second weekend she has felt this was on Tuesday she had went to an IV clinic and got fluids- she states she felt a little better afterwards but now feels the same

## 2022-01-03 DIAGNOSIS — R5383 Other fatigue: Secondary | ICD-10-CM | POA: Diagnosis not present

## 2022-01-03 DIAGNOSIS — W57XXXA Bitten or stung by nonvenomous insect and other nonvenomous arthropods, initial encounter: Secondary | ICD-10-CM | POA: Diagnosis not present

## 2022-01-03 DIAGNOSIS — M791 Myalgia, unspecified site: Secondary | ICD-10-CM | POA: Diagnosis not present

## 2022-01-04 ENCOUNTER — Emergency Department
Admission: EM | Admit: 2022-01-04 | Discharge: 2022-01-05 | Disposition: A | Discharge status: Home / Self Care | Payer: No Typology Code available for payment source | Attending: Emergency Medicine | Admitting: Emergency Medicine

## 2022-01-04 ENCOUNTER — Other Ambulatory Visit: Payer: Self-pay

## 2022-01-04 ENCOUNTER — Emergency Department: Payer: No Typology Code available for payment source

## 2022-01-04 ENCOUNTER — Encounter: Payer: Self-pay | Admitting: Emergency Medicine

## 2022-01-04 DIAGNOSIS — R0602 Shortness of breath: Secondary | ICD-10-CM | POA: Insufficient documentation

## 2022-01-04 DIAGNOSIS — R002 Palpitations: Secondary | ICD-10-CM | POA: Diagnosis not present

## 2022-01-04 DIAGNOSIS — Z8616 Personal history of COVID-19: Secondary | ICD-10-CM | POA: Diagnosis not present

## 2022-01-04 DIAGNOSIS — R5383 Other fatigue: Secondary | ICD-10-CM | POA: Diagnosis not present

## 2022-01-04 DIAGNOSIS — R0789 Other chest pain: Secondary | ICD-10-CM | POA: Insufficient documentation

## 2022-01-04 DIAGNOSIS — R531 Weakness: Secondary | ICD-10-CM | POA: Insufficient documentation

## 2022-01-04 DIAGNOSIS — I10 Essential (primary) hypertension: Secondary | ICD-10-CM | POA: Insufficient documentation

## 2022-01-04 LAB — CBC
HCT: 40.3 % (ref 36.0–46.0)
Hemoglobin: 13.3 g/dL (ref 12.0–15.0)
MCH: 29.4 pg (ref 26.0–34.0)
MCHC: 33 g/dL (ref 30.0–36.0)
MCV: 89 fL (ref 80.0–100.0)
Platelets: 245 10*3/uL (ref 150–400)
RBC: 4.53 MIL/uL (ref 3.87–5.11)
RDW: 11.9 % (ref 11.5–15.5)
WBC: 7.7 10*3/uL (ref 4.0–10.5)
nRBC: 0 % (ref 0.0–0.2)

## 2022-01-04 LAB — BASIC METABOLIC PANEL
Anion gap: 10 (ref 5–15)
BUN: 12 mg/dL (ref 8–23)
CO2: 26 mmol/L (ref 22–32)
Calcium: 9.6 mg/dL (ref 8.9–10.3)
Chloride: 101 mmol/L (ref 98–111)
Creatinine, Ser: 0.85 mg/dL (ref 0.44–1.00)
GFR, Estimated: >60 mL/min (ref >60)
Glucose, Bld: 95 mg/dL (ref 70–99)
Potassium: 3.9 mmol/L (ref 3.5–5.1)
Sodium: 137 mmol/L (ref 135–145)

## 2022-01-04 LAB — TROPONIN I (HIGH SENSITIVITY)
Troponin I (High Sensitivity): 4 ng/L
Troponin I (High Sensitivity): 3 ng/L (ref <18)

## 2022-01-04 MED ORDER — METOPROLOL TARTRATE 25 MG PO TABS
25.0000 mg | ORAL_TABLET | Freq: Once | ORAL | Status: AC
  Administered 2022-01-04: 25 mg via ORAL
  Filled 2022-01-04: qty 1

## 2022-01-04 MED ORDER — METOPROLOL TARTRATE 25 MG PO TABS: 12.5000 mg | ORAL_TABLET | Freq: Two times a day (BID) | ORAL | 0 refills | Status: DC

## 2022-01-04 MED ORDER — METOPROLOL TARTRATE 25 MG PO TABS: 12.5000 mg | ORAL_TABLET | Freq: Once | ORAL | Status: DC

## 2022-01-04 NOTE — ED Provider Triage Note (Signed)
Emergency Medicine Provider Triage Evaluation Note  Stacey Francis, a 66 y.o. female  was evaluated in triage.  Pt complains of/chest pressure and palpitations intermittently today.  Patient also reports generalized weakness and dizziness.  She presents to the ED from [facility] noting generalized weakness and malaise.  Denies fevers, NVD.  Review of Systems  Positive: CP, weakness, SOB Negative: FCS  Physical Exam  BP (!) 162/76 (BP Location: Left Arm)   Pulse 87   Temp 98.8 F (37.1 C) (Oral)   Resp 20   Ht 5\' 3"  (1.6 m)   Wt 53.5 kg   SpO2 100%   BMI 20.90 kg/m  Gen:   Awake, no distress   Resp:  Normal effort CTA MSK:   Moves extremities without difficulty  Other:    Medical Decision Making  Medically screening exam initiated at 5:42 PM.  Appropriate orders placed.  JAKIRA MCFADDEN was informed that the remainder of the evaluation will be completed by another provider, this initial triage assessment does not replace that evaluation, and the importance of remaining in the ED until their evaluation is complete.  Patient to the ED for evaluation of central chest pain and palpitations with intermittent episodes today.  She also reports generalized weakness and dizziness.   Elta Guadeloupe, PA-C 01/04/22 1743

## 2022-01-04 NOTE — ED Triage Notes (Signed)
First Nurse Note:  Pt via POV from [facility] c/o R shoulder pain that started on 7/21 pt was seen and discharged for that. Pt c/o palpitations, chest heaviness, weakness, and SOB.  218/96 BP takes lisnopril

## 2022-01-04 NOTE — Discharge Instructions (Signed)
For now, we are going to start metoprolol 12.5 mg twice a day.   This is a low-dose beta blocker that helps with heart rate and blood pressure.  Keep a log of your blood pressure and pulse, and hold if your pulse is less than 70 consistently or blood pressure is below 120.  Call Dr. Gwen Pounds to set up an appointment for follow-up and possible outpatient cardiac/holter monitoring

## 2022-01-04 NOTE — ED Provider Notes (Signed)
[facility] Provider Note    Event Date/Time   First MD Initiated Contact with Patient 01/04/22 2137     (approximate)   History   Chest Pain   HPI  TRINITY HAUN is a 66 y.o. female here with generalized weakness, chest heaviness, shortness of breath.  Patient states that over the last several weeks, she has had persistent, generalized weakness and shortness of breath.  She was actually seen on 7/30 for palpitations, and had a heart rate up to the 150s and even 180s at that time.  She states that since then, she has been having intermittent episodes of palpitations.  She is felt very weak and low energy.  She has felt generally fatigued.  She has had some intermittent shoulder and chest pain, though this is largely resolved over the last several days.  Denies any medication changes.  Of note, she has hypertension since she suffered from COVID-19, and has been having progressively increasing hypertension at home.  She is also noticed that her heart rate has been gradually increasing to the 90s to 100s.  Denies known history of A-fib.     Physical Exam   Triage Vital Signs: ED Triage Vitals  Enc Vitals Group     BP 01/04/22 1734 (!) 162/76     Pulse Rate 01/04/22 1734 87     Resp 01/04/22 1734 20     Temp 01/04/22 1734 98.8 F (37.1 C)     Temp Source 01/04/22 1734 Oral     SpO2 01/04/22 1734 100 %     Weight 01/04/22 1735 118 lb (53.5 kg)     Height 01/04/22 1735 5\' 3"  (1.6 m)     Head Circumference --      Peak Flow --      Pain Score 01/04/22 1734 2     Pain Loc --      Pain Edu? --      Excl. in [place]? --     Most recent vital signs: Vitals:   01/04/22 2230 01/04/22 2300  BP: (!) 162/84 (!) 165/82  Pulse: 75 81  Resp: 12 14  Temp:    SpO2: 100% 100%     General: Awake, no distress.  CV:  Good peripheral perfusion.  Regular rate and rhythm.  No murmurs. Resp:  Normal effort.  Lung clear to auscultation bilaterally. Abd:  No distention.   No tenderness. Other:  No lower extremity edema.   ED Results / Procedures / Treatments   Labs (all labs ordered are listed, but only abnormal results are displayed) Labs Reviewed  BASIC METABOLIC PANEL  CBC  TROPONIN I (HIGH SENSITIVITY)  TROPONIN I (HIGH SENSITIVITY)     EKG Normal sinus rhythm, ventricular rate 89.  PR 156, QRS 70, QTc 447.  No acute ST elevations or depressions.  No EKG evidence of acute ischemia or infarct.   RADIOLOGY Chest x-ray: No active disease   I also independently reviewed and agree with radiologist interpretations.   PROCEDURES:  Critical Care performed: No   MEDICATIONS ORDERED IN ED: Medications  metoprolol tartrate (LOPRESSOR) tablet 25 mg (25 mg Oral Given 01/04/22 2315)     IMPRESSION / MDM / ASSESSMENT AND PLAN / ED COURSE  I reviewed the triage vital signs and the nursing notes.                               The  patient is on the cardiac monitor to evaluate for evidence of arrhythmia and/or significant heart rate changes.   Ddx:  Differential includes the following, with pertinent life- or limb-threatening emergencies considered:  Transient tachyarrhythmia such as AFib or SVT, hyperthyroidism, anemia, panic disorder/anxiety  Patient's presentation is most consistent with acute presentation with potential threat to life or bodily function.  MDM:  66 yo F with PMHx HTN here with intermittent palpitations, weakness. Based on history, primary concern is possible intermittent afib/SVT given reported HR 150-180s, DDx includes symptomatic HTN, anxiety. Pt is in NSR in ED. Labs reassuring. CBC shows no leukocytosis or anemia. BMP unremarkable. EKG nonischemic and trop neg x 2. Reviewed prior visits, cardiology notes. Pt endorses palpitations, tachycardia, and increasing BP. Will trial metoprolol as some of her sx do suggest possible svt/transient tachycardia, and will refer to Cardiology for outpt monitoring. No apparent emergent  condition at this time.   MEDICATIONS GIVEN IN ED: Medications  metoprolol tartrate (LOPRESSOR) tablet 25 mg (25 mg Oral Given 01/04/22 2315)     Consults:     EMR reviewed  Prior ED visits, visits with Dr. Gwen Pounds with [facility] in march 2023    FINAL CLINICAL IMPRESSION(S) / ED DIAGNOSES   Final diagnoses:  Palpitations  Generalized weakness     Rx / DC Orders   ED Discharge Orders          Ordered    metoprolol tartrate (LOPRESSOR) 25 MG tablet  2 times daily        01/04/22 2348             Note:  This document was prepared using Dragon voice recognition software and may include unintentional dictation errors.   Shaune Pollack, MD 01/05/22 [phone]

## 2022-01-04 NOTE — ED Triage Notes (Signed)
Patient c/o central chest pressure and palpitations intermittently today. Also c/o dizziness and weakness. Patient states

## 2022-01-11 ENCOUNTER — Other Ambulatory Visit: Payer: Self-pay | Admitting: Student

## 2022-01-11 DIAGNOSIS — R5383 Other fatigue: Secondary | ICD-10-CM

## 2022-01-11 DIAGNOSIS — Z0000 Encounter for general adult medical examination without abnormal findings: Secondary | ICD-10-CM | POA: Diagnosis not present

## 2022-01-11 DIAGNOSIS — E010 Iodine-deficiency related diffuse (endemic) goiter: Secondary | ICD-10-CM | POA: Diagnosis not present

## 2022-01-20 ENCOUNTER — Ambulatory Visit
Admission: RE | Admit: 2022-01-20 | Discharge: 2022-01-20 | Disposition: A | Discharge status: Home / Self Care | Payer: No Typology Code available for payment source | Source: Ambulatory Visit | Attending: Student | Admitting: Student

## 2022-01-20 DIAGNOSIS — E010 Iodine-deficiency related diffuse (endemic) goiter: Secondary | ICD-10-CM | POA: Insufficient documentation

## 2022-01-20 DIAGNOSIS — R5383 Other fatigue: Secondary | ICD-10-CM | POA: Diagnosis not present

## 2022-01-20 DIAGNOSIS — E042 Nontoxic multinodular goiter: Secondary | ICD-10-CM | POA: Diagnosis not present

## 2022-04-05 DIAGNOSIS — Z6820 Body mass index (BMI) 20.0-20.9, adult: Secondary | ICD-10-CM | POA: Diagnosis not present

## 2022-04-05 DIAGNOSIS — J029 Acute pharyngitis, unspecified: Secondary | ICD-10-CM | POA: Diagnosis not present

## 2022-05-09 IMAGING — CT CT HEAD W/O CM
3 series · 15 of 47 positions shown, 18 images · non-contrast
Comparison: 11/09/2020

CLINICAL DATA: Headache, new or worsening (Age >= 50y)

EXAM:
CT HEAD WITHOUT CONTRAST
TECHNIQUE: Contiguous axial images were obtained from the base of the skull
through the vertex without intravenous contrast.

[Series 2: head wo · axial · 0.43mm/px · z∈[+393,+518]mm · 9 of 31 slices shown, 12 images]
[im 3/31  brain]
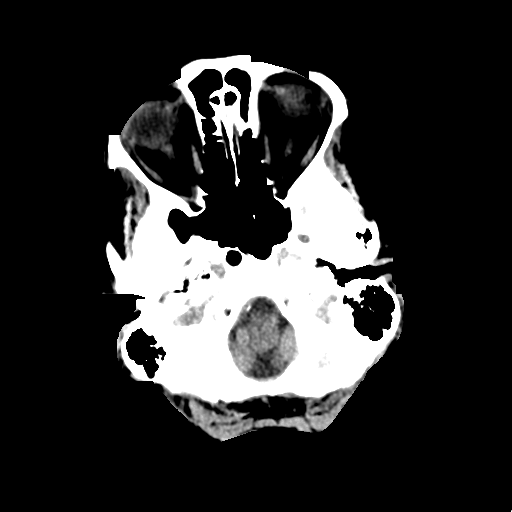
[im 3/31  bone]
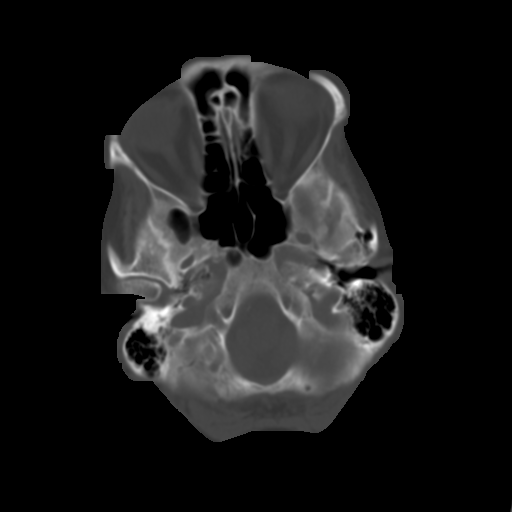
[im 6/31  brain]
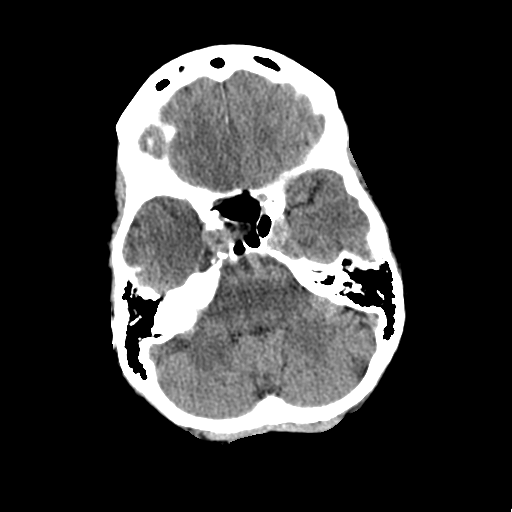
[im 9/31  brain]
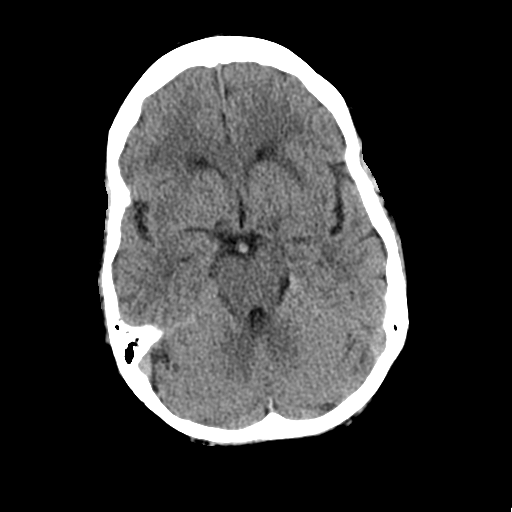
[im 12/31  brain]
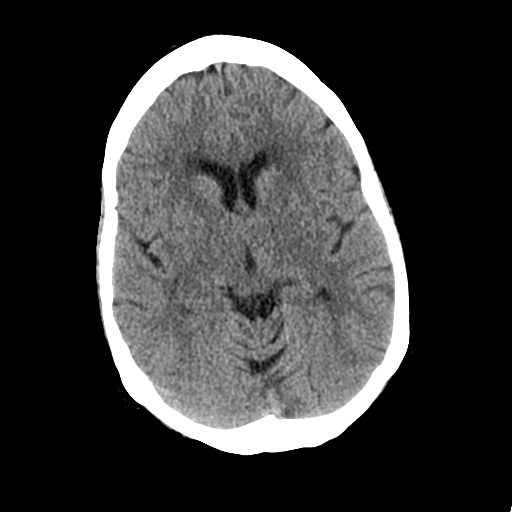
[im 16/31  brain]
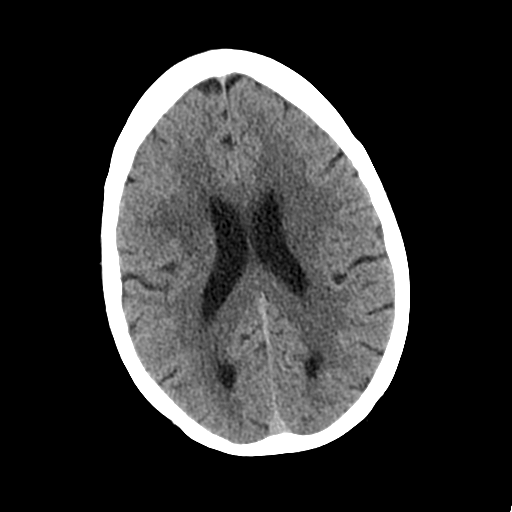
[im 16/31  bone]
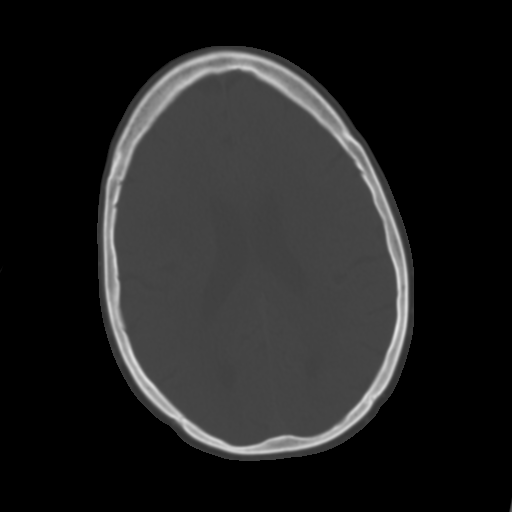
[im 19/31  brain]
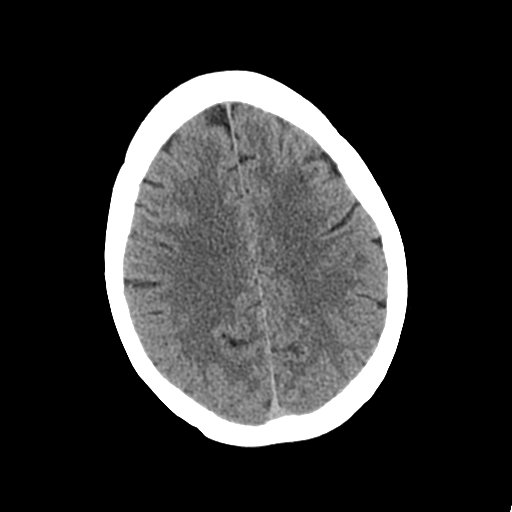
[im 22/31  brain]
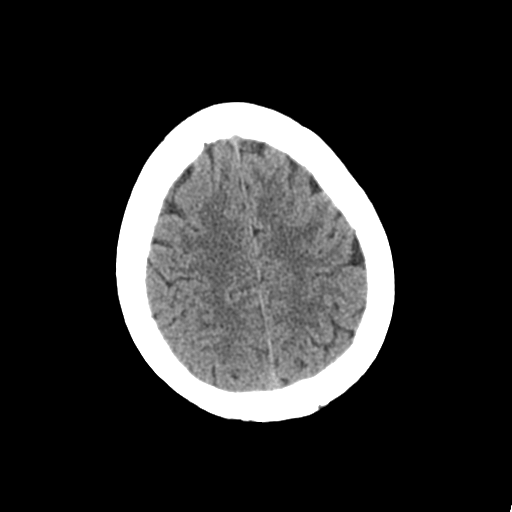
[im 25/31  brain]
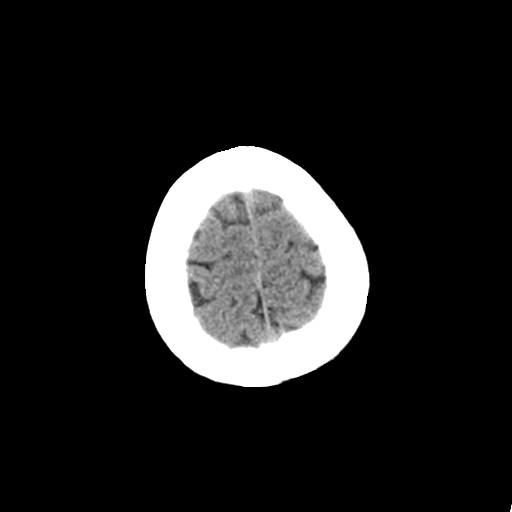
[im 28/31  brain]
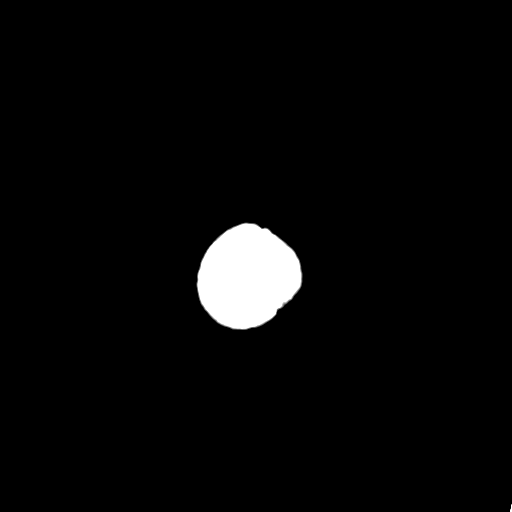
[im 28/31  bone]
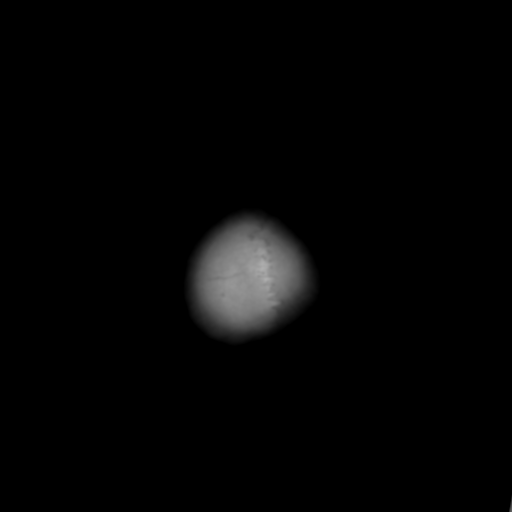

[Series 4: coronal soft tissue · coronal · 0.29mm/px · 3 of 62 slices shown]
[im 21/62  brain]
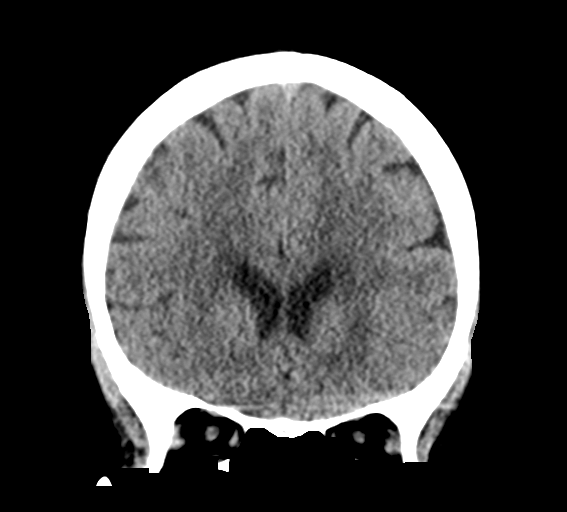
[im 28/62  brain]
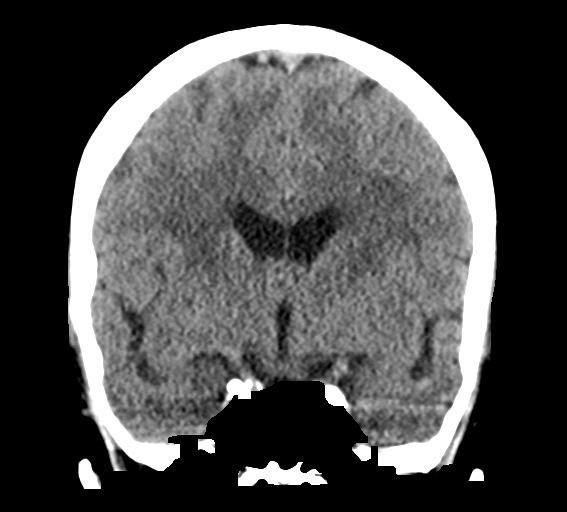
[im 34/62  brain]
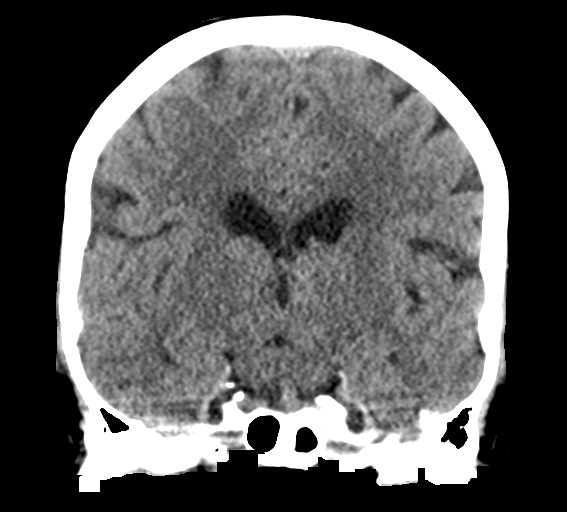

[Series 5: sagittal soft tissue · sagittal · 0.29mm/px · 3 of 49 slices shown]
[im 17/49  brain]
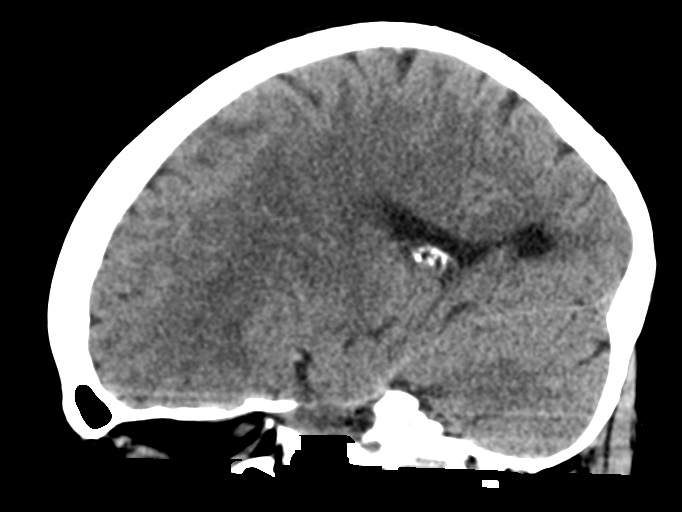
[im 25/49  brain]
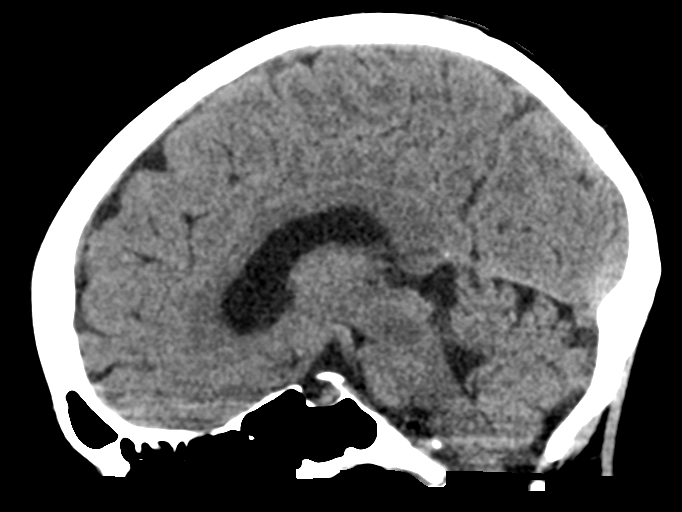
[im 33/49  brain]
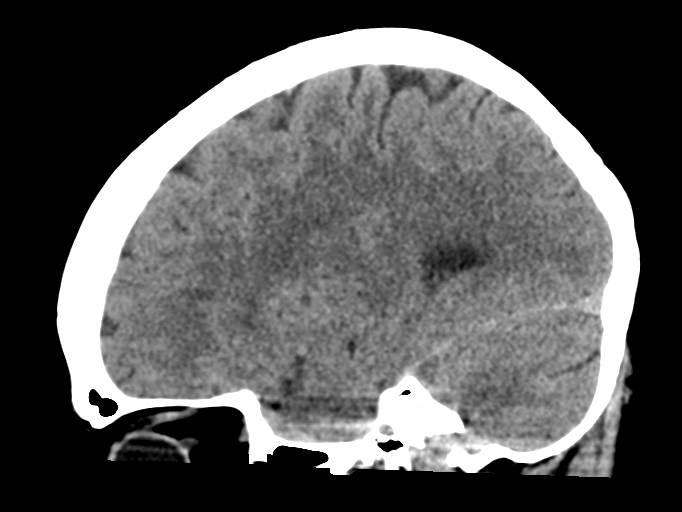

[15 of 47 positions shown; findings below may reference images not displayed]

FINDINGS: Brain: No acute intracranial abnormality. Specifically, no
hemorrhage, hydrocephalus, mass lesion, acute infarction, or
significant intracranial injury. Chronic small vessel disease
throughout the deep white matter appear

Vascular: No hyperdense vessel or unexpected calcification.

Skull: No acute calvarial abnormality.

Sinuses/Orbits: No acute findings

Other: None
IMPRESSION: No acute intracranial abnormality.

## 2022-05-27 DIAGNOSIS — R52 Pain, unspecified: Secondary | ICD-10-CM | POA: Diagnosis not present

## 2022-05-27 DIAGNOSIS — Z6820 Body mass index (BMI) 20.0-20.9, adult: Secondary | ICD-10-CM | POA: Diagnosis not present

## 2022-05-27 DIAGNOSIS — J09X2 Influenza due to identified novel influenza A virus with other respiratory manifestations: Secondary | ICD-10-CM | POA: Diagnosis not present

## 2022-07-30 ENCOUNTER — Emergency Department: Payer: No Typology Code available for payment source

## 2022-07-30 ENCOUNTER — Emergency Department
Admission: EM | Admit: 2022-07-30 | Discharge: 2022-07-30 | Disposition: A | Discharge status: Home / Self Care | Payer: No Typology Code available for payment source | Attending: Emergency Medicine | Admitting: Emergency Medicine

## 2022-07-30 ENCOUNTER — Encounter: Payer: Self-pay | Admitting: Emergency Medicine

## 2022-07-30 DIAGNOSIS — R002 Palpitations: Secondary | ICD-10-CM | POA: Diagnosis not present

## 2022-07-30 DIAGNOSIS — B9689 Other specified bacterial agents as the cause of diseases classified elsewhere: Secondary | ICD-10-CM | POA: Diagnosis not present

## 2022-07-30 DIAGNOSIS — I10 Essential (primary) hypertension: Secondary | ICD-10-CM | POA: Insufficient documentation

## 2022-07-30 DIAGNOSIS — R519 Headache, unspecified: Secondary | ICD-10-CM | POA: Insufficient documentation

## 2022-07-30 DIAGNOSIS — Z20822 Contact with and (suspected) exposure to covid-19: Secondary | ICD-10-CM | POA: Insufficient documentation

## 2022-07-30 DIAGNOSIS — R4182 Altered mental status, unspecified: Secondary | ICD-10-CM | POA: Diagnosis not present

## 2022-07-30 DIAGNOSIS — N390 Urinary tract infection, site not specified: Secondary | ICD-10-CM | POA: Insufficient documentation

## 2022-07-30 DIAGNOSIS — R42 Dizziness and giddiness: Secondary | ICD-10-CM | POA: Diagnosis not present

## 2022-07-30 DIAGNOSIS — R531 Weakness: Secondary | ICD-10-CM | POA: Insufficient documentation

## 2022-07-30 DIAGNOSIS — R5383 Other fatigue: Secondary | ICD-10-CM | POA: Diagnosis present

## 2022-07-30 LAB — BASIC METABOLIC PANEL
Anion gap: 12 (ref 5–15)
BUN: 19 mg/dL (ref 8–23)
CO2: 22 mmol/L (ref 22–32)
Calcium: 9.2 mg/dL (ref 8.9–10.3)
Chloride: 102 mmol/L (ref 98–111)
Creatinine, Ser: 1 mg/dL (ref 0.44–1.00)
GFR, Estimated: >60 mL/min (ref >60)
Glucose, Bld: 95 mg/dL (ref 70–99)
Potassium: 3.8 mmol/L (ref 3.5–5.1)
Sodium: 136 mmol/L (ref 135–145)

## 2022-07-30 LAB — RESP PANEL BY RT-PCR (RSV, FLU A&B, COVID)  RVPGX2
Influenza A by PCR: NEGATIVE
Influenza B by PCR: NEGATIVE
Resp Syncytial Virus by PCR: NEGATIVE
SARS Coronavirus 2 by RT PCR: NEGATIVE

## 2022-07-30 LAB — CBC
HCT: 41.5 % (ref 36.0–46.0)
Hemoglobin: 13.5 g/dL (ref 12.0–15.0)
MCH: 29.2 pg (ref 26.0–34.0)
MCHC: 32.5 g/dL (ref 30.0–36.0)
MCV: 89.8 fL (ref 80.0–100.0)
Platelets: 275 10*3/uL (ref 150–400)
RBC: 4.62 MIL/uL (ref 3.87–5.11)
RDW: 12.5 % (ref 11.5–15.5)
WBC: 7 10*3/uL (ref 4.0–10.5)
nRBC: 0 % (ref 0.0–0.2)

## 2022-07-30 LAB — TSH: TSH: 7.447 u[IU]/mL — ABNORMAL HIGH (ref 0.350–4.500)

## 2022-07-30 LAB — URINALYSIS, ROUTINE W REFLEX MICROSCOPIC
Bilirubin Urine: NEGATIVE
Glucose, UA: NEGATIVE mg/dL
Hgb urine dipstick: NEGATIVE
Ketones, ur: 5 mg/dL — AB
Nitrite: NEGATIVE
Protein, ur: NEGATIVE mg/dL
Specific Gravity, Urine: 1.005 (ref 1.005–1.030)
Squamous Epithelial / HPF: NONE SEEN /HPF (ref 0–5)
pH: 5 (ref 5.0–8.0)

## 2022-07-30 LAB — HEPATIC FUNCTION PANEL
ALT: 16 U/L (ref 0–44)
AST: 22 U/L (ref 15–41)
Albumin: 4.6 g/dL (ref 3.5–5.0)
Alkaline Phosphatase: 49 U/L (ref 38–126)
Bilirubin, Direct: <0.1 mg/dL (ref 0.0–0.2)
Total Bilirubin: 1.2 mg/dL (ref 0.3–1.2)
Total Protein: 7.7 g/dL (ref 6.5–8.1)

## 2022-07-30 LAB — T4, FREE: Free T4: 0.77 ng/dL (ref 0.61–1.12)

## 2022-07-30 LAB — CBG MONITORING, ED: Glucose-Capillary: 91 mg/dL (ref 70–99)

## 2022-07-30 LAB — TROPONIN I (HIGH SENSITIVITY)
Troponin I (High Sensitivity): <2 ng/L (ref <18)
Troponin I (High Sensitivity): 3 ng/L (ref <18)

## 2022-07-30 MED ORDER — FOSFOMYCIN TROMETHAMINE 3 G PO PACK
3.0000 g | PACK | Freq: Once | ORAL | Status: AC
  Administered 2022-07-30: 3 g via ORAL
  Filled 2022-07-30: qty 3

## 2022-07-30 MED ORDER — SODIUM CHLORIDE 0.9 % IV BOLUS
500.0000 mL | Freq: Once | INTRAVENOUS | Status: AC
  Administered 2022-07-30: 500 mL via INTRAVENOUS

## 2022-07-30 NOTE — ED Provider Notes (Signed)
[facility] Provider Note    Event Date/Time   First MD Initiated Contact with Patient 07/30/22 1730     (approximate)   History   Dizziness   HPI  Stacey Francis is a 67 y.o. female with history of thyromegaly, hypertension who comes in with multiple symptoms.  Patient reports 2 episodes of these waves of just not feeling right.  She reports having the initial wave start this morning after breakfast and another wave just prior before coming in.  She reports like it feels like she has coldness in her hands, jitteriness all over her body, lightheadedness and then just start to go away.  She reports a little bit of mild pain over her head.  She states that she Feels like she is anxious but denies any history of anxiety.  She is also had some other visits for generalized weakness, palpitations as well  On review of records she has been seen previously on 11/09/2020 for a stroke workup with negative MRI and MRA  Physical Exam   Triage Vital Signs: ED Triage Vitals  Enc Vitals Group     BP 07/30/22 1713 (!) 183/90     Pulse Rate 07/30/22 1713 95     Resp 07/30/22 1713 18     Temp 07/30/22 1713 97.7 F (36.5 C)     Temp src --      SpO2 07/30/22 1713 100 %     Weight 07/30/22 1712 118 lb (53.5 kg)     Height 07/30/22 1712 '5\' 3"'$  (1.6 m)     Head Circumference --      Peak Flow --      Pain Score 07/30/22 1712 4     Pain Loc --      Pain Edu? --      Excl. in [place]? --     Most recent vital signs: Vitals:   07/30/22 1713  BP: (!) 183/90  Pulse: 95  Resp: 18  Temp: 97.7 F (36.5 C)  SpO2: 100%     General: Awake, no distress.  CV:  Good peripheral perfusion.  Resp:  Normal effort.  Abd:  No distention.  Other:  Cranials 2-12 are intact.  Equal strength in arms and legs.  Finger-nose intact.  Ambulatory without any ataxia.   ED Results / Procedures / Treatments   Labs (all labs ordered are listed, but only abnormal results are  displayed) Labs Reviewed  CBC  BASIC METABOLIC PANEL  URINALYSIS, ROUTINE W REFLEX MICROSCOPIC  CBG MONITORING, ED     EKG  My interpretation of EKG:  Normal sinus rate of 96 without any ST elevation or T wave inversions, normal intervals  RADIOLOGY I have reviewed the CT head personally and no ICH   PROCEDURES:  Critical Care performed: No  .1-3 Lead EKG Interpretation  Performed by: Vanessa Paxton, MD Authorized by: Vanessa , MD     Interpretation: normal     ECG rate:  90   ECG rate assessment: normal     Rhythm: sinus rhythm     Ectopy: none     Conduction: normal      MEDICATIONS ORDERED IN ED: Medications  sodium chloride 0.9 % bolus 500 mL (has no administration in time range)     IMPRESSION / MDM / ASSESSMENT AND PLAN / ED COURSE  I reviewed the triage vital signs and the nursing notes.   Patient's presentation is most consistent with acute presentation with  potential threat to life or bodily function.   Differential includes thyroid dysfunction, Electra abnormalities, UTI, ACS.  Will get CT head given hypertension and slight headache to rule out ICH but cranial nerves appear normal.  Do not feel he this is a posterior stroke.   BMP is normal.  CBC reassuring.  Thyroid normal  COVID, flu are negative.  Urine with some slight white cells and leuks and ketones.  She denies any obvious UTI symptoms but I wonder if this could be causing her symptoms we will send for urine culture and start on antibiotic.  Will give some fluids due to some slight ketones.  Discussed further with patient and she still feels a little lightheaded and a little off.  We discussed MRI  IMPRESSION: 1. No acute intracranial abnormality. 2. Multifocal hyperintense T2-weighted signal within the white matter, most consistent with chronic microvascular ischemia.  Discussed results with patient and she can follow-up outpatient with neurology.  She reports feeling better will start  her on antibiotic for UTI -given allergies will do a dose of fosfomycin urine culture was ordered.  The patient is on the cardiac monitor to evaluate for evidence of arrhythmia and/or significant heart rate changes.      FINAL CLINICAL IMPRESSION(S) / ED DIAGNOSES   Final diagnoses:  Lightheadedness  Urinary tract infection without hematuria, site unspecified     Rx / DC Orders   ED Discharge Orders     None        Note:  This document was prepared using Dragon voice recognition software and may include unintentional dictation errors.   Vanessa Summerfield, MD 07/30/22 2200

## 2022-07-30 NOTE — ED Triage Notes (Signed)
Pt via POV from home. Pt c/o dizziness when she was in the kitchen this AM, states that her BP has been elevated but states she does not take medication for it. Pt is A&Ox4 and NAD. Ambulatory to triage.

## 2022-07-30 NOTE — ED Notes (Signed)
Pt to MRI at this time.

## 2022-07-30 NOTE — ED Notes (Signed)
Pt returned from MRI at this time

## 2022-07-30 NOTE — Discharge Instructions (Addendum)
Follow-up with a neurologist for your MRI however your workup was reassuring and we have started you on antibiotic for UTI this is a one-time dose we have sent it for culture return to the ER if you develop worsening symptoms or any other concerns  IMPRESSION: 1. No acute intracranial abnormality. 2. Multifocal hyperintense T2-weighted signal within the white matter, most consistent with chronic microvascular ischemia.

## 2022-07-30 NOTE — ED Notes (Signed)
Discharge instructions explained to patient and family at this time. Patient and family state they understand and agree.

## 2022-08-01 LAB — URINE CULTURE: Culture: NO GROWTH

## 2022-08-12 DIAGNOSIS — I959 Hypotension, unspecified: Secondary | ICD-10-CM | POA: Diagnosis not present

## 2022-08-12 DIAGNOSIS — R0689 Other abnormalities of breathing: Secondary | ICD-10-CM | POA: Diagnosis not present

## 2022-08-12 DIAGNOSIS — R457 State of emotional shock and stress, unspecified: Secondary | ICD-10-CM | POA: Diagnosis not present

## 2022-08-12 DIAGNOSIS — R0789 Other chest pain: Secondary | ICD-10-CM | POA: Diagnosis not present

## 2022-08-12 DIAGNOSIS — R079 Chest pain, unspecified: Secondary | ICD-10-CM | POA: Diagnosis not present

## 2022-08-13 ENCOUNTER — Other Ambulatory Visit: Payer: Self-pay

## 2022-08-13 ENCOUNTER — Encounter
Admission: EM | Disposition: A | Discharge status: Home / Self Care | Payer: Self-pay | Source: Home / Self Care | Attending: Student

## 2022-08-13 ENCOUNTER — Inpatient Hospital Stay
Admit: 2022-08-13 | Discharge: 2022-08-13 | Disposition: A | Discharge status: Home / Self Care | Payer: No Typology Code available for payment source | Attending: Internal Medicine | Admitting: Internal Medicine

## 2022-08-13 ENCOUNTER — Inpatient Hospital Stay
Admission: EM | Admit: 2022-08-13 | Discharge: 2022-08-15 | DRG: 322 | Disposition: A | Discharge status: Home / Self Care | Payer: No Typology Code available for payment source | Attending: Student | Admitting: Student

## 2022-08-13 DIAGNOSIS — I110 Hypertensive heart disease with heart failure: Secondary | ICD-10-CM | POA: Diagnosis present

## 2022-08-13 DIAGNOSIS — M7989 Other specified soft tissue disorders: Secondary | ICD-10-CM | POA: Diagnosis present

## 2022-08-13 DIAGNOSIS — Z880 Allergy status to penicillin: Secondary | ICD-10-CM

## 2022-08-13 DIAGNOSIS — Z79890 Hormone replacement therapy (postmenopausal): Secondary | ICD-10-CM

## 2022-08-13 DIAGNOSIS — I959 Hypotension, unspecified: Secondary | ICD-10-CM | POA: Diagnosis not present

## 2022-08-13 DIAGNOSIS — E785 Hyperlipidemia, unspecified: Secondary | ICD-10-CM

## 2022-08-13 DIAGNOSIS — I213 ST elevation (STEMI) myocardial infarction of unspecified site: Secondary | ICD-10-CM | POA: Diagnosis present

## 2022-08-13 DIAGNOSIS — M25431 Effusion, right wrist: Secondary | ICD-10-CM | POA: Diagnosis present

## 2022-08-13 DIAGNOSIS — Z1152 Encounter for screening for COVID-19: Secondary | ICD-10-CM | POA: Diagnosis not present

## 2022-08-13 DIAGNOSIS — N179 Acute kidney failure, unspecified: Secondary | ICD-10-CM | POA: Diagnosis not present

## 2022-08-13 DIAGNOSIS — I5030 Unspecified diastolic (congestive) heart failure: Secondary | ICD-10-CM | POA: Diagnosis not present

## 2022-08-13 DIAGNOSIS — E876 Hypokalemia: Secondary | ICD-10-CM

## 2022-08-13 DIAGNOSIS — I10 Essential (primary) hypertension: Secondary | ICD-10-CM

## 2022-08-13 DIAGNOSIS — R202 Paresthesia of skin: Secondary | ICD-10-CM | POA: Diagnosis present

## 2022-08-13 DIAGNOSIS — Z79899 Other long term (current) drug therapy: Secondary | ICD-10-CM | POA: Diagnosis not present

## 2022-08-13 DIAGNOSIS — I2510 Atherosclerotic heart disease of native coronary artery without angina pectoris: Secondary | ICD-10-CM | POA: Diagnosis not present

## 2022-08-13 DIAGNOSIS — E039 Hypothyroidism, unspecified: Secondary | ICD-10-CM | POA: Diagnosis not present

## 2022-08-13 DIAGNOSIS — I2119 ST elevation (STEMI) myocardial infarction involving other coronary artery of inferior wall: Principal | ICD-10-CM | POA: Diagnosis present

## 2022-08-13 DIAGNOSIS — I5032 Chronic diastolic (congestive) heart failure: Secondary | ICD-10-CM | POA: Diagnosis present

## 2022-08-13 DIAGNOSIS — I2111 ST elevation (STEMI) myocardial infarction involving right coronary artery: Principal | ICD-10-CM

## 2022-08-13 HISTORY — PX: CORONARY/GRAFT ACUTE MI REVASCULARIZATION: CATH118305

## 2022-08-13 HISTORY — PX: LEFT HEART CATH AND CORONARY ANGIOGRAPHY: CATH118249

## 2022-08-13 LAB — BASIC METABOLIC PANEL
Anion gap: 8 (ref 5–15)
BUN: 21 mg/dL (ref 8–23)
CO2: 23 mmol/L (ref 22–32)
Calcium: 8 mg/dL — ABNORMAL LOW (ref 8.9–10.3)
Chloride: 105 mmol/L (ref 98–111)
Creatinine, Ser: 1.29 mg/dL — ABNORMAL HIGH (ref 0.44–1.00)
GFR, Estimated: 46 mL/min — ABNORMAL LOW (ref >60)
Glucose, Bld: 130 mg/dL — ABNORMAL HIGH (ref 70–99)
Potassium: 3.8 mmol/L (ref 3.5–5.1)
Sodium: 136 mmol/L (ref 135–145)

## 2022-08-13 LAB — CBC WITH DIFFERENTIAL/PLATELET
Abs Immature Granulocytes: 0.02 10*3/uL (ref 0.00–0.07)
Basophils Absolute: 0.1 10*3/uL (ref 0.0–0.1)
Basophils Relative: 1 %
Eosinophils Absolute: 0.1 10*3/uL (ref 0.0–0.5)
Eosinophils Relative: 1 %
HCT: 37.2 % (ref 36.0–46.0)
Hemoglobin: 12.6 g/dL (ref 12.0–15.0)
Immature Granulocytes: 0 %
Lymphocytes Relative: 48 %
Lymphs Abs: 4.3 10*3/uL — ABNORMAL HIGH (ref 0.7–4.0)
MCH: 29.4 pg (ref 26.0–34.0)
MCHC: 33.9 g/dL (ref 30.0–36.0)
MCV: 86.9 fL (ref 80.0–100.0)
Monocytes Absolute: 0.7 10*3/uL (ref 0.1–1.0)
Monocytes Relative: 8 %
Neutro Abs: 3.8 10*3/uL (ref 1.7–7.7)
Neutrophils Relative %: 42 %
Platelets: 249 10*3/uL (ref 150–400)
RBC: 4.28 MIL/uL (ref 3.87–5.11)
RDW: 12.3 % (ref 11.5–15.5)
WBC: 8.9 10*3/uL (ref 4.0–10.5)
nRBC: 0 % (ref 0.0–0.2)

## 2022-08-13 LAB — CBC
HCT: 34.1 % — ABNORMAL LOW (ref 36.0–46.0)
Hemoglobin: 11.2 g/dL — ABNORMAL LOW (ref 12.0–15.0)
MCH: 29.5 pg (ref 26.0–34.0)
MCHC: 32.8 g/dL (ref 30.0–36.0)
MCV: 89.7 fL (ref 80.0–100.0)
Platelets: 194 10*3/uL (ref 150–400)
RBC: 3.8 MIL/uL — ABNORMAL LOW (ref 3.87–5.11)
RDW: 12.1 % (ref 11.5–15.5)
WBC: 10.8 10*3/uL — ABNORMAL HIGH (ref 4.0–10.5)
nRBC: 0 % (ref 0.0–0.2)

## 2022-08-13 LAB — PROTIME-INR
INR: 1 (ref 0.8–1.2)
Prothrombin Time: 12.6 seconds (ref 11.4–15.2)

## 2022-08-13 LAB — COMPREHENSIVE METABOLIC PANEL
ALT: 12 U/L (ref 0–44)
AST: 21 U/L (ref 15–41)
Albumin: 4.1 g/dL (ref 3.5–5.0)
Alkaline Phosphatase: 49 U/L (ref 38–126)
Anion gap: 12 (ref 5–15)
BUN: 22 mg/dL (ref 8–23)
CO2: 21 mmol/L — ABNORMAL LOW (ref 22–32)
Calcium: 9.2 mg/dL (ref 8.9–10.3)
Chloride: 103 mmol/L (ref 98–111)
Creatinine, Ser: 1.54 mg/dL — ABNORMAL HIGH (ref 0.44–1.00)
GFR, Estimated: 37 mL/min — ABNORMAL LOW (ref >60)
Glucose, Bld: 155 mg/dL — ABNORMAL HIGH (ref 70–99)
Potassium: 3 mmol/L — ABNORMAL LOW (ref 3.5–5.1)
Sodium: 136 mmol/L (ref 135–145)
Total Bilirubin: 0.9 mg/dL (ref 0.3–1.2)
Total Protein: 7.2 g/dL (ref 6.5–8.1)

## 2022-08-13 LAB — LIPID PANEL
Cholesterol: 298 mg/dL — ABNORMAL HIGH (ref 0–200)
HDL: 84 mg/dL (ref >40)
LDL Cholesterol: 200 mg/dL — ABNORMAL HIGH (ref 0–99)
Total CHOL/HDL Ratio: 3.5 RATIO
Triglycerides: 70 mg/dL (ref <150)
VLDL: 14 mg/dL (ref 0–40)

## 2022-08-13 LAB — ECHOCARDIOGRAM COMPLETE
AR max vel: 2.15 cm2
AV Area VTI: 2.62 cm2
AV Area mean vel: 2.47 cm2
AV Mean grad: 4 mmHg
AV Peak grad: 7.3 mmHg
Ao pk vel: 1.35 m/s
Area-P 1/2: 5.54 cm2
Calc EF: 69 %
Height: 63 in
P 1/2 time: 413 msec
S' Lateral: 2.4 cm
Single Plane A2C EF: 66.7 %
Single Plane A4C EF: 70.5 %
Weight: 1969.6 oz

## 2022-08-13 LAB — POCT ACTIVATED CLOTTING TIME
Activated Clotting Time: 223 seconds
Activated Clotting Time: 271 seconds
Activated Clotting Time: 314 seconds

## 2022-08-13 LAB — TROPONIN I (HIGH SENSITIVITY)
Troponin I (High Sensitivity): 3 ng/L (ref <18)
Troponin I (High Sensitivity): 117 ng/L (ref <18)
Troponin I (High Sensitivity): 664 ng/L (ref <18)

## 2022-08-13 LAB — RESP PANEL BY RT-PCR (RSV, FLU A&B, COVID)  RVPGX2
Influenza A by PCR: NEGATIVE
Influenza B by PCR: NEGATIVE
Resp Syncytial Virus by PCR: NEGATIVE
SARS Coronavirus 2 by RT PCR: NEGATIVE

## 2022-08-13 LAB — APTT: aPTT: 25 seconds (ref 24–36)

## 2022-08-13 LAB — GLUCOSE, CAPILLARY: Glucose-Capillary: 102 mg/dL — ABNORMAL HIGH (ref 70–99)

## 2022-08-13 LAB — MRSA NEXT GEN BY PCR, NASAL: MRSA by PCR Next Gen: NOT DETECTED

## 2022-08-13 LAB — MAGNESIUM: Magnesium: 2.1 mg/dL (ref 1.7–2.4)

## 2022-08-13 SURGERY — CORONARY/GRAFT ACUTE MI REVASCULARIZATION
Anesthesia: Moderate Sedation

## 2022-08-13 MED ORDER — ASPIRIN 81 MG PO CHEW
81.0000 mg | CHEWABLE_TABLET | Freq: Every day | ORAL | Status: DC
  Administered 2022-08-14 – 2022-08-15 (×2): 81 mg via ORAL
  Filled 2022-08-13 (×2): qty 1

## 2022-08-13 MED ORDER — ACETAMINOPHEN 325 MG PO TABS
650.0000 mg | ORAL_TABLET | ORAL | Status: DC | PRN
  Administered 2022-08-13 – 2022-08-15 (×5): 650 mg via ORAL
  Filled 2022-08-13 (×5): qty 2

## 2022-08-13 MED ORDER — HEPARIN SODIUM (PORCINE) 1000 UNIT/ML IJ SOLN
INTRAMUSCULAR | Status: AC
  Filled 2022-08-13: qty 10

## 2022-08-13 MED ORDER — HEPARIN SODIUM (PORCINE) 5000 UNIT/ML IJ SOLN
4000.0000 [IU] | Freq: Once | INTRAMUSCULAR | Status: AC
  Administered 2022-08-13: 4000 [IU] via INTRAVENOUS

## 2022-08-13 MED ORDER — LABETALOL HCL 5 MG/ML IV SOLN: 10.0000 mg | INTRAVENOUS | Status: AC | PRN

## 2022-08-13 MED ORDER — CANGRELOR TETRASODIUM 50 MG IV SOLR
INTRAVENOUS | Status: AC
  Filled 2022-08-13: qty 50

## 2022-08-13 MED ORDER — VERAPAMIL HCL 2.5 MG/ML IV SOLN
INTRAVENOUS | Status: AC
  Filled 2022-08-13: qty 2

## 2022-08-13 MED ORDER — HEPARIN SODIUM (PORCINE) 1000 UNIT/ML IJ SOLN
INTRAMUSCULAR | Status: DC | PRN
  Administered 2022-08-13: 5000 [IU] via INTRAVENOUS
  Administered 2022-08-13: 4000 [IU] via INTRAVENOUS
  Administered 2022-08-13: 5000 [IU] via INTRAVENOUS

## 2022-08-13 MED ORDER — MIDAZOLAM HCL 2 MG/2ML IJ SOLN
INTRAMUSCULAR | Status: DC | PRN
  Administered 2022-08-13: 1 mg via INTRAVENOUS
  Administered 2022-08-13: .5 mg via INTRAVENOUS

## 2022-08-13 MED ORDER — MIDAZOLAM HCL 2 MG/2ML IJ SOLN
INTRAMUSCULAR | Status: AC
  Filled 2022-08-13: qty 2

## 2022-08-13 MED ORDER — HYDRALAZINE HCL 20 MG/ML IJ SOLN: 10.0000 mg | INTRAMUSCULAR | Status: AC | PRN

## 2022-08-13 MED ORDER — SODIUM CHLORIDE 0.9 % IV SOLN: 250.0000 mL | INTRAVENOUS | Status: DC | PRN

## 2022-08-13 MED ORDER — ONDANSETRON HCL 4 MG/2ML IJ SOLN: 4.0000 mg | Freq: Four times a day (QID) | INTRAMUSCULAR | Status: DC | PRN

## 2022-08-13 MED ORDER — SODIUM CHLORIDE 0.9 % IV SOLN: INTRAVENOUS | Status: DC

## 2022-08-13 MED ORDER — TICAGRELOR 90 MG PO TABS
180.0000 mg | ORAL_TABLET | Freq: Once | ORAL | Status: AC
  Administered 2022-08-13: 180 mg via ORAL
  Filled 2022-08-13: qty 2

## 2022-08-13 MED ORDER — NITROGLYCERIN 0.4 MG SL SUBL
0.4000 mg | SUBLINGUAL_TABLET | SUBLINGUAL | Status: DC | PRN
  Administered 2022-08-13: 0.4 mg via SUBLINGUAL

## 2022-08-13 MED ORDER — MORPHINE SULFATE (PF) 4 MG/ML IV SOLN: 4.0000 mg | Freq: Once | INTRAVENOUS | Status: DC

## 2022-08-13 MED ORDER — SODIUM CHLORIDE 0.9% FLUSH: 3.0000 mL | INTRAVENOUS | Status: DC | PRN

## 2022-08-13 MED ORDER — TICAGRELOR 90 MG PO TABS
180.0000 mg | ORAL_TABLET | Freq: Once | ORAL | Status: AC
  Administered 2022-08-13: 180 mg via ORAL

## 2022-08-13 MED ORDER — LISINOPRIL 5 MG PO TABS
2.5000 mg | ORAL_TABLET | Freq: Every day | ORAL | Status: DC
  Administered 2022-08-13 – 2022-08-15 (×3): 2.5 mg via ORAL
  Filled 2022-08-13 (×4): qty 1

## 2022-08-13 MED ORDER — FENTANYL CITRATE (PF) 100 MCG/2ML IJ SOLN
INTRAMUSCULAR | Status: AC
  Filled 2022-08-13: qty 2

## 2022-08-13 MED ORDER — SODIUM CHLORIDE 0.9 % IV BOLUS
500.0000 mL | Freq: Once | INTRAVENOUS | Status: AC
  Administered 2022-08-13: 500 mL via INTRAVENOUS

## 2022-08-13 MED ORDER — METOPROLOL TARTRATE 25 MG PO TABS
12.5000 mg | ORAL_TABLET | Freq: Two times a day (BID) | ORAL | Status: DC
  Administered 2022-08-13 – 2022-08-15 (×6): 12.5 mg via ORAL
  Filled 2022-08-13 (×10): qty 1

## 2022-08-13 MED ORDER — ATROPINE SULFATE 1 MG/10ML IJ SOSY
PREFILLED_SYRINGE | INTRAMUSCULAR | Status: AC
  Filled 2022-08-13: qty 10

## 2022-08-13 MED ORDER — HEPARIN (PORCINE) IN NACL 1000-0.9 UT/500ML-% IV SOLN
INTRAVENOUS | Status: DC | PRN
  Administered 2022-08-13 (×2): 500 mL

## 2022-08-13 MED ORDER — ONDANSETRON HCL 4 MG/2ML IJ SOLN
INTRAMUSCULAR | Status: DC | PRN
  Administered 2022-08-13: 4 mg via INTRAVENOUS

## 2022-08-13 MED ORDER — SODIUM CHLORIDE 0.9% FLUSH
3.0000 mL | Freq: Two times a day (BID) | INTRAVENOUS | Status: DC
  Administered 2022-08-13 – 2022-08-15 (×5): 3 mL via INTRAVENOUS

## 2022-08-13 MED ORDER — FENTANYL CITRATE (PF) 100 MCG/2ML IJ SOLN
INTRAMUSCULAR | Status: DC | PRN
  Administered 2022-08-13 (×3): 25 ug via INTRAVENOUS

## 2022-08-13 MED ORDER — IOHEXOL 300 MG/ML  SOLN
INTRAMUSCULAR | Status: DC | PRN
  Administered 2022-08-13: 275 mL

## 2022-08-13 MED ORDER — ATORVASTATIN CALCIUM 80 MG PO TABS
80.0000 mg | ORAL_TABLET | Freq: Every day | ORAL | Status: DC
  Administered 2022-08-13 – 2022-08-15 (×3): 80 mg via ORAL
  Filled 2022-08-13: qty 1
  Filled 2022-08-13: qty 4
  Filled 2022-08-13: qty 1

## 2022-08-13 MED ORDER — SODIUM CHLORIDE 0.9 % IV SOLN
INTRAVENOUS | Status: AC | PRN
  Administered 2022-08-13: 4 ug/kg/min via INTRAVENOUS

## 2022-08-13 MED ORDER — TICAGRELOR 90 MG PO TABS
90.0000 mg | ORAL_TABLET | Freq: Two times a day (BID) | ORAL | Status: DC
  Administered 2022-08-13 – 2022-08-15 (×4): 90 mg via ORAL
  Filled 2022-08-13 (×4): qty 1

## 2022-08-13 MED ORDER — CANGRELOR BOLUS VIA INFUSION
INTRAVENOUS | Status: DC | PRN
  Administered 2022-08-13: 1674 ug via INTRAVENOUS

## 2022-08-13 MED ORDER — HEPARIN (PORCINE) IN NACL 1000-0.9 UT/500ML-% IV SOLN
INTRAVENOUS | Status: AC
  Filled 2022-08-13: qty 1000

## 2022-08-13 MED ORDER — VERAPAMIL HCL 2.5 MG/ML IV SOLN
INTRAVENOUS | Status: DC | PRN
  Administered 2022-08-13 (×2): 2.5 mg via INTRA_ARTERIAL

## 2022-08-13 MED ORDER — ONDANSETRON HCL 4 MG/2ML IJ SOLN: 4.0000 mg | Freq: Once | INTRAMUSCULAR | Status: DC

## 2022-08-13 MED ORDER — ONDANSETRON HCL 4 MG/2ML IJ SOLN
INTRAMUSCULAR | Status: AC
  Filled 2022-08-13: qty 2

## 2022-08-13 MED ORDER — CHLORHEXIDINE GLUCONATE CLOTH 2 % EX PADS
6.0000 | MEDICATED_PAD | Freq: Every day | CUTANEOUS | Status: DC
  Administered 2022-08-14: 6 via TOPICAL

## 2022-08-13 MED ORDER — LEVOTHYROXINE SODIUM 25 MCG PO TABS
25.0000 ug | ORAL_TABLET | Freq: Every day | ORAL | Status: DC
  Administered 2022-08-14 – 2022-08-15 (×2): 25 ug via ORAL
  Filled 2022-08-13 (×3): qty 1

## 2022-08-13 MED ORDER — POTASSIUM CHLORIDE 10 MEQ/100ML IV SOLN
10.0000 meq | Freq: Once | INTRAVENOUS | Status: AC
  Administered 2022-08-13: 10 meq via INTRAVENOUS
  Filled 2022-08-13: qty 100

## 2022-08-13 MED ORDER — SODIUM CHLORIDE 0.9 % WEIGHT BASED INFUSION
1.0000 mL/kg/h | INTRAVENOUS | Status: AC
  Administered 2022-08-13: 1 mL/kg/h via INTRAVENOUS

## 2022-08-13 SURGICAL SUPPLY — 26 items
BALLN TREK RX 2.25X15 (BALLOONS) ×1
BALLN TREK RX 2.5X15 (BALLOONS) ×1
BALLN ~~LOC~~ TREK NEO RX 2.5X12 (BALLOONS) ×1
BALLOON TREK RX 2.25X15 (BALLOONS) IMPLANT
BALLOON TREK RX 2.5X15 (BALLOONS) IMPLANT
BALLOON ~~LOC~~ TREK NEO RX 2.5X12 (BALLOONS) IMPLANT
CATH INFINITI 5 FR JL3.5 (CATHETERS) IMPLANT
CATH INFINITI JR4 5F (CATHETERS) IMPLANT
CATH VISTA GUIDE 6FR JR4 (CATHETERS) IMPLANT
CATH VISTA GUIDE 6FR XB3.5 (CATHETERS) IMPLANT
DEVICE RAD TR BAND REGULAR (VASCULAR PRODUCTS) IMPLANT
DRAPE BRACHIAL (DRAPES) IMPLANT
GLIDESHEATH SLEND SS 6F .021 (SHEATH) IMPLANT
GUIDEWIRE INQWIRE 1.5J.035X260 (WIRE) IMPLANT
INQWIRE 1.5J .035X260CM (WIRE) ×1
KIT ENCORE 26 ADVANTAGE (KITS) IMPLANT
KIT SYRINGE INJ CVI SPIKEX1 (MISCELLANEOUS) IMPLANT
PACK CARDIAC CATH (CUSTOM PROCEDURE TRAY) ×1 IMPLANT
PROTECTION STATION PRESSURIZED (MISCELLANEOUS) ×1
SET ATX-X65L (MISCELLANEOUS) IMPLANT
STATION PROTECTION PRESSURIZED (MISCELLANEOUS) IMPLANT
STENT ONYX FRONTIER 2.25X22 (Permanent Stent) IMPLANT
STENT ONYX FRONTIER 2.5X15 (Permanent Stent) IMPLANT
TUBING CIL FLEX 10 FLL-RA (TUBING) IMPLANT
WIRE G HI TQ BMW 190 (WIRE) IMPLANT
WIRE HITORQ VERSACORE ST 145CM (WIRE) IMPLANT

## 2022-08-13 NOTE — Progress Notes (Addendum)
[facility] Cardiology    SUBJECTIVE: Patient complaining of right wrist discomfort TR band removed patient has OpSite patient dressing in place which appears to be functioning adequately no significant bleeding still has some ecchymosis on the skin and wrist area possible range of motion pain is much improved no chest pain no shortness of breath  Vitals:   08/13/22 0900 08/13/22 1000 08/13/22 1100 08/13/22 1200  BP: 133/69 (!) 144/76 123/71 (!) 144/70  Pulse: 80 80 78 77  Resp: '18  14 18  '$ Temp:    98.3 F (36.8 C)  TempSrc:    Oral  SpO2: 100% 100% 100% 100%  Weight:      Height:         Intake/Output Summary (Last 24 hours) at 08/13/2022 1252 Last data filed at 08/13/2022 1200 Gross per 24 hour  Intake 3249.78 ml  Output 1950 ml  Net 1299.78 ml      PHYSICAL EXAM  General: Well developed, well nourished, in no acute distress HEENT:  Normocephalic and atramatic Neck:  No JVD.  Lungs: Clear bilaterally to auscultation and percussion. Heart: HRRR . Normal S1 and S2 without gallops or murmurs.  Abdomen: Bowel sounds are positive, abdomen soft and non-tender  Msk:  Back normal, normal gait. Normal strength and tone for age. Extremities: No clubbing, cyanosis or edema.  Right wrist bandage with mild ecchymosis mild swelling but improved good capillary refill good pulses Neuro: Alert and oriented X 3. Psych:  Good affect, responds appropriately   LABS: Basic Metabolic Panel: Recent Labs    08/13/22 0017 08/13/22 0117 08/13/22 0338  NA 136  --  136  K 3.0*  --  3.8  CL 103  --  105  CO2 21*  --  23  GLUCOSE 155*  --  130*  BUN 22  --  21  CREATININE 1.54*  --  1.29*  CALCIUM 9.2  --  8.0*  MG  --  2.1  --    Liver Function Tests: Recent Labs    08/13/22 0017  AST 21  ALT 12  ALKPHOS 49  BILITOT 0.9  PROT 7.2  ALBUMIN 4.1   No results for input(s): "LIPASE", "AMYLASE" in the last 72 hours. CBC: Recent Labs    08/13/22 0017 08/13/22 0338  WBC 8.9 10.8*   NEUTROABS 3.8  --   HGB 12.6 11.2*  HCT 37.2 34.1*  MCV 86.9 89.7  PLT 249 194   Cardiac Enzymes: No results for input(s): "CKTOTAL", "CKMB", "CKMBINDEX", "TROPONINI" in the last 72 hours. BNP: Invalid input(s): "POCBNP" D-Dimer: No results for input(s): "DDIMER" in the last 72 hours. Hemoglobin A1C: No results for input(s): "HGBA1C" in the last 72 hours. Fasting Lipid Panel: Recent Labs    08/13/22 0017  CHOL 298*  HDL 84  LDLCALC 200*  TRIG 70  CHOLHDL 3.5   Thyroid Function Tests: No results for input(s): "TSH", "T4TOTAL", "T3FREE", "THYROIDAB" in the last 72 hours.  Invalid input(s): "FREET3" Anemia Panel: No results for input(s): "VITAMINB12", "FOLATE", "FERRITIN", "TIBC", "IRON", "RETICCTPCT" in the last 72 hours.  No results found.   Echo pending  TELEMETRY: Normal sinus rhythm rate of 80 nonspecific ST-T changes:  ASSESSMENT AND PLAN:  Principal Problem:   STEMI (ST elevation myocardial infarction) ([place]) Active Problems:   Essential hypertension   Dyslipidemia   AKI (acute kidney injury) ([place])   Hypokalemia    Plan Resting comfortably in ICU right wrist improved reduce swelling reduced tenderness full range of motion no  further chest pain continue current management consider transferring to telemetry STEMI presentation status post PCI and stent x 2 to circumflex and RCA post IR with with DES times Continue aspirin and Brilinta for at least 12 months Statin therapy Lipitor for hyperlipidemia post MI Recommend ACE ARB beta-blocker statin aspirin Brilinta Right wrist swelling and ecchymosis improving swelling much better good function good circulation continue pressure dressing and elevation Renal insufficiency continue adequate hydration consider involving nephrology Echocardiogram for further assessment left ventricular function wall motion post MI 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  LAD is deferred and may be staged for a later date Hopefully increase activity by ambulating in the halls and transfer to telemetry in progressive care shortly for increased mobility and activity Continue to correct electrolytes with hypokalemia Refer the patient to cardiac rehab Will reevaluate right wrist in the morning but continue to increase activity. Will review LAD with other interventionalists to determine next course of action whether PCI versus CABG or medical therapy Hopefully discharge home within 48 to 72 hours   Yolonda Kida, MD, 08/13/2022 12:52 PM

## 2022-08-13 NOTE — CV Procedure (Signed)
Brief STEMI cath note Patient presented from the field via EMS with EKG consistent with a STEMI inferior ST elevation reciprocal changes laterally as well as anteriorly V1 through V3 hemodynamically stable  Right radial approach  Left ventriculogram showed normal left ventricular function EF of at least 55%  Coronaries Left main large minor irregularities LAD was large with a large ectatic proximal area in tandem patient also had to 95% lesions proximally 75% lesion distally TIMI-3 flow very tortuous vessel mid to distal Diagonal 1 small to medium with 95% ostial disease Circumflex very large OM 2   99% lesion IRA TIMI-3 flow RCA large 50% proximal 95 long distal up to the bifurcation of the PDA and PL IRA TIMI-3 flow  Because it was not clear which vessel was a true infarct-related vessel we treated them both Patient had PCI and stent to circumflex a 2.5 x 15 mm frontier Onyx 12 atm reducing lesion from 99 down to 0 TIMI-3 flow was maintained Distal RCA PCI and stent deployment of a 2.25 x 22 mm frontier Onyx to 13 atm reducing lesion from 95 down to 0%  LAD was deferred Patient was transferred to ICU for recovery TR band was placed Patient was placed on cangrelor for at least 2 hours and an additional dose of Brilinta was given the patient had nausea and vomiting during the case Symptoms appeared to improve including ST-T segments Patient expected to stay on aspirin and Brilinta for at least 12 months Hospitalist called to help with admission and medical management  Full cath note to follow

## 2022-08-13 NOTE — Plan of Care (Signed)
Discussed with patient plan of care for the evening, pain management and admission questions with some teach back displayed. Reviewed bedtime medications and any as needed medications as well.   Problem: Education: Goal: Knowledge of General Education information will improve Description: Including pain rating scale, medication(s)/side effects and non-pharmacologic comfort measures Outcome: Progressing   Problem: Health Behavior/Discharge Planning: Goal: Ability to manage health-related needs will improve Outcome: Progressing

## 2022-08-13 NOTE — Consult Note (Signed)
Preop emergent cardiac cath for inferior STEMI Inferior STEMI was called and feel patient brought to the emergency room persistent chest pain which started about 30 to 45 minutes prior to presentation to the ER Still having chest pain 7 out of 10 mid chest radiating to her neck.  Denies previous cardiac history no significant allergies adequate pulses hemodynamically stable systolic pressure was [id number] heart rate of 80 EKG normal sinus rhythm ST elevation inferiorly reciprocal depression 1 and L  and V 1-3 Plan is emergent cardiac cath intention to treat with possible PCI and stent

## 2022-08-13 NOTE — Assessment & Plan Note (Signed)
-   The patient will be on high-dose statin therapy as mentioned above.

## 2022-08-13 NOTE — Consult Note (Signed)
CARDIOLOGY CONSULT NOTE               SUBJECTIVE: Patient complaining of right wrist discomfort TR band in place denies shortness of breath improving chest discomfort           Vitals:    08/13/22 0900 08/13/22 1000 08/13/22 1100 08/13/22 1200  BP: 133/69 (!) 144/76 123/71 (!) 144/70  Pulse: 80 80 78 77  Resp: '18   14 18  '$ Temp:       98.3 F (36.8 C)  TempSrc:       Oral  SpO2: 100% 100% 100% 100%  Weight:          Height:                Intake/Output Summary (Last 24 hours) at 08/13/2022 1252 Last data filed at 08/13/2022 1200    Gross per 24 hour  Intake 3249.78 ml  Output 1950 ml  Net 1299.78 ml          PHYSICAL EXAM   General: Well developed, well nourished, in no acute distress HEENT:  Normocephalic and atramatic Neck:   No JVD.  Lungs: Clear bilaterally to auscultation and percussion. Heart: HRRR . Normal S1 and S2 without gallops or murmurs.  Abdomen: Bowel sounds are positive, abdomen soft and non-tender  Msk:  Back normal, normal gait. Normal strength and tone for age. Extremities: No clubbing, cyanosis or edema.   Neuro: Alert and oriented X 3. Psych:  Good affect, responds appropriately     LABS: Basic Metabolic Panel: Recent Labs (last 2 labs)       Recent Labs    08/13/22 0017 08/13/22 0117 08/13/22 0338  NA 136  --  136  K 3.0*  --  3.8  CL 103  --  105  CO2 21*  --  23  GLUCOSE 155*  --  130*  BUN 22  --  21  CREATININE 1.54*  --  1.29*  CALCIUM 9.2  --  8.0*  MG  --  2.1  --       Liver Function Tests: Recent Labs (last 2 labs)     Recent Labs    08/13/22 0017  AST 21  ALT 12  ALKPHOS 49  BILITOT 0.9  PROT 7.2  ALBUMIN 4.1      Recent Labs (last 2 labs)  No results for input(s): "LIPASE", "AMYLASE" in the last 72 hours.   CBC: Recent Labs (last 2 labs)      Recent Labs    08/13/22 0017 08/13/22 0338  WBC 8.9 10.8*  NEUTROABS 3.8  --   HGB 12.6 11.2*  HCT 37.2 34.1*  MCV 86.9 89.7  PLT 249 194       Cardiac Enzymes: Recent Labs (last 2 labs)  No results for input(s): "CKTOTAL", "CKMB", "CKMBINDEX", "TROPONINI" in the last 72 hours.   BNP: Recent Labs (last 2 labs)  Invalid input(s): "POCBNP"   D-Dimer: Recent Labs (last 2 labs)  No results for input(s): "DDIMER" in the last 72 hours.   Hemoglobin A1C: Recent Labs (last 2 labs)  No results for input(s): "HGBA1C" in the last 72 hours.   Fasting Lipid Panel: Recent Labs (last 2 labs)     Recent Labs    08/13/22 0017  CHOL 298*  HDL 84  LDLCALC 200*  TRIG 70  CHOLHDL 3.5      Thyroid Function Tests:  Recent Labs (last 2 labs)  No results for input(s): "TSH", "  T4TOTAL", "T3FREE", "THYROIDAB" in the last 72 hours.   Invalid input(s): "FREET3"   Anemia Panel: Recent Labs (last 2 labs)  No results for input(s): "VITAMINB12", "FOLATE", "FERRITIN", "TIBC", "IRON", "RETICCTPCT" in the last 72 hours.     Imaging Results (Last 48 hours)  No results found.       Echo pending   TELEMETRY: Normal sinus rhythm rate of 80 nonspecific ST-T changes:   ASSESSMENT AND PLAN:   Principal Problem:   STEMI (ST elevation myocardial infarction) ([place]) Active Problems:   Essential hypertension   Dyslipidemia   AKI (acute kidney injury) ([place])   Hypokalemia     Plan STEMI presentation status post PCI and stent x 2 to circumflex and RCA post IR with with DES times Continue aspirin and Brilinta for at least 12 months Statin therapy Lipitor for hyperlipidemia post MI Recommend ACE ARB beta-blocker statin aspirin Brilinta Right wrist swelling and ecchymosis improving swelling much better good function good circulation continue pressure dressing and elevation Renal insufficiency continue adequate hydration consider involving nephrology Echocardiogram for further assessment left ventricular function wall motion post MI 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  LAD  is deferred and may be staged for a later date Hopefully increase activity by ambulating in the halls and transfer to telemetry in progressive care shortly Continue to correct electrolytes with hypokalemia Refer the patient to cardiac rehab Anticipate discharge 2 to 3 days     Yolonda Kida, MD, 08/13/2022 12:52 PM

## 2022-08-13 NOTE — ED Triage Notes (Signed)
Pt arrives via [facility] with CC of chest pressure that started approx 230 mins prior to arrival. Pt reports 7/10 chest pressure and appears to be uncomfortable.

## 2022-08-13 NOTE — H&P (Signed)
[place]   PATIENT NAME: Stacey Francis    MR#:  [id number]  DATE OF BIRTH:  1955-11-27  DATE OF ADMISSION:  08/13/2022  PRIMARY CARE PHYSICIAN: Soles, Howell Rucks, MD   Patient is coming from: Home  REQUESTING/REFERRING PHYSICIAN: Lujean Amel, MD  CHIEF COMPLAINT:   Chief Complaint  Patient presents with   Chest Pain    HISTORY OF PRESENT ILLNESS:  Stacey Francis is a 67 y.o. female with medical history significant for essential hypertension, presented to the emergency room with acute onset of chest pain that started at 11:30 PM that felt like pressure sensation over her entire chest and radiating to her neck.  She had associated diaphoresis without nausea or vomiting.  No dyspnea or cough or wheezing or hemoptysis.  No leg pain or edema or recent travels or surgeries.  No fever or chills.  No bleeding diathesis.  She has been having bilateral feet tingling over the last few weeks with occasional cold feeling.  She had an EKG suspicious for STEMI with inferior ST segment elevation and reciprocal T wave inversion in V1 through V3 per EMS.  By the time the patient came to the ER EKG was much better.  ED Course: Upon arrival to the ED, BP was 129/68 and vital signs were within normal.  Labs revealed hypokalemia of 3 with creatinine 1.54 previously normal and magnesium 2.1.  Total cholesterol was 298 and LDL 200.  CBC was within normal.  Coag profile was within normal.  High-sensitivity troponin was 3 COVID 19, RSV and influenza A antigens came back negative. EKG as reviewed by me : EKG showed normal sinus rhythm with a rate of 77 with T wave inversion anterolaterally and half a millimeter ST segment elevation inferiorly with Q waves in V3. Imaging: 2 view chest x-ray showed no acute cardiopulmonary disease.  The patient was given 500 mill IV normal saline bolus stool I-S for brief hypotension and 4 mg of IV Zofran as well as placed on sublingual nitroglycerin in addition to  IV heparin bolus and drip.  Dr. Clayborn Bigness was called for code STEMI.  The patient was taken to the Cath Lab and underwent PCI and 2 stents placement in the RCA and LCA.  She is admitted to an ICU bed for further evaluation and management. PAST MEDICAL HISTORY:   Past Medical History:  Diagnosis Date   Hypertension    Sinus infection     PAST SURGICAL HISTORY:   Past Surgical History:  Procedure Laterality Date   ECTOPIC PREGNANCY SURGERY      SOCIAL HISTORY:   Social History   Tobacco Use   Smoking status: Never   Smokeless tobacco: Never  Substance Use Topics   Alcohol use: No    FAMILY HISTORY:  History reviewed. No pertinent family history.  DRUG ALLERGIES:   Allergies  Allergen Reactions   Penicillins     REVIEW OF SYSTEMS:   ROS As per history of present illness. All pertinent systems were reviewed above. Constitutional, HEENT, cardiovascular, respiratory, GI, GU, musculoskeletal, neuro, psychiatric, endocrine, integumentary and hematologic systems were reviewed and are otherwise negative/unremarkable except for positive findings mentioned above in the HPI.   MEDICATIONS AT HOME:   Prior to Admission medications   Medication Sig Start Date End Date Taking? Authorizing Provider  amLODipine (NORVASC) 5 MG tablet Take 1 tablet (5 mg total) by mouth daily. 10/24/18 10/24/19  Merlyn Lot, MD  bacitracin 500 UNIT/GM ointment Apply 1  application topically 2 (two) times daily. 05/01/21   Rudene Re, MD  cyproheptadine (PERIACTIN) 4 MG tablet Take 1 tablet (4 mg total) by mouth 3 (three) times daily as needed for allergies. 12/26/17   Menshew, Dannielle Karvonen, PA-C  metoprolol tartrate (LOPRESSOR) 25 MG tablet Take 0.5 tablets (12.5 mg total) by mouth 2 (two) times daily. 01/04/22 02/03/22  Duffy Bruce, MD  predniSONE (STERAPRED UNI-PAK 21 TAB) 10 MG (21) TBPK tablet Take by mouth daily. Dispense steroid taper pack as directed 12/19/17   Earleen Newport, MD   ranitidine (ZANTAC) 150 MG tablet Take 1 tablet (150 mg total) by mouth 2 (two) times daily. 12/26/17   Menshew, Dannielle Karvonen, PA-C      VITAL SIGNS:  Blood pressure 135/71, pulse 90, temperature 98.2 F (36.8 C), temperature source Oral, resp. rate 19, height '5\' 3"'$  (1.6 m), weight 55.8 kg, SpO2 100 %.  PHYSICAL EXAMINATION:  Physical Exam  GENERAL:  67 y.o.-year-old Caucasian female patient lying in the bed with no acute distress.  EYES: Pupils equal, round, reactive to light and accommodation. No scleral icterus. Extraocular muscles intact.  HEENT: Head atraumatic, normocephalic. Oropharynx and nasopharynx clear.  NECK:  Supple, no jugular venous distention. No thyroid enlargement, no tenderness.  LUNGS: Normal breath sounds bilaterally, no wheezing, rales,rhonchi or crepitation. No use of accessory muscles of respiration.  CARDIOVASCULAR: Regular rate and rhythm, S1, S2 normal. No murmurs, rubs, or gallops.  ABDOMEN: Soft, nondistended, nontender. Bowel sounds present. No organomegaly or mass.  EXTREMITIES: No pedal edema, cyanosis, or clubbing.  NEUROLOGIC: Cranial nerves II through XII are intact. Muscle strength 5/5 in all extremities. Sensation intact. Gait not checked.  PSYCHIATRIC: The patient is alert and oriented x 3.  Normal affect and good eye contact. SKIN: No obvious rash, lesion, or ulcer.   LABORATORY PANEL:   CBC Recent Labs  Lab 08/13/22 0338  WBC 10.8*  HGB 11.2*  HCT 34.1*  PLT 194   ------------------------------------------------------------------------------------------------------------------  Chemistries  Recent Labs  Lab 08/13/22 0017 08/13/22 0117 08/13/22 0338  NA 136  --  136  K 3.0*  --  3.8  CL 103  --  105  CO2 21*  --  23  GLUCOSE 155*  --  130*  BUN 22  --  21  CREATININE 1.54*  --  1.29*  CALCIUM 9.2  --  8.0*  MG  --  2.1  --   AST 21  --   --   ALT 12  --   --   ALKPHOS 49  --   --   BILITOT 0.9  --   --     ------------------------------------------------------------------------------------------------------------------  Cardiac Enzymes No results for input(s): "TROPONINI" in the last 168 hours. ------------------------------------------------------------------------------------------------------------------  RADIOLOGY:  No results found.    IMPRESSION AND PLAN:  Assessment and Plan: * STEMI (ST elevation myocardial infarction) ([place]) - The patient underwent PCI and RCA and LCx stents placement. - She will be admitted to an ICU bed. - She is placed on IV Kengreal in addition to as needed sublingual nitroglycerin and IV morphine sulfate, beta-blocker therapy with Lopressor and ACE inhibitor therapy with Zestril. -We will continue aspirin high-dose statin therapy.  AKI (acute kidney injury) ([place]) - The patient will be hydrated with IV normal saline. - Will follow BMP. - We will avoid nephrotoxins.  Hypokalemia - Potassium will be replaced and followed. - We will check magnesium level.  Essential hypertension - We will continue his antihypertensives.  Dyslipidemia - The patient will be on high-dose statin therapy as mentioned above.   DVT prophylaxis: Lovenox.  Advanced Care Planning:  Code Status: full code.  Family Communication:  The plan of care was discussed in details with the patient (and family). I answered all questions. The patient agreed to proceed with the above mentioned plan. Further management will depend upon hospital course. Disposition Plan: Back to previous home environment Consults called: Cardiology. All the records are reviewed and case discussed with ED provider.  Status is: Inpatient  At the time of the admission, it appears that the appropriate admission status for this patient is inpatient.  This is judged to be reasonable and necessary in order to provide the required intensity of service to ensure the patient's safety given the presenting  symptoms, physical exam findings and initial radiographic and laboratory data in the context of comorbid conditions.  The patient requires inpatient status due to high intensity of service, high risk of further deterioration and high frequency of surveillance required.  I certify that at the time of admission, it is my clinical judgment that the patient will require inpatient hospital care extending more than 2 midnights.                            Dispo: The patient is from: Home              Anticipated d/c is to: Home              Patient currently is not medically stable to d/c.              Difficult to place patient: No  Christel Mormon M.D on 08/13/2022 at 5:36 AM  [facility]   From 7 PM-7 AM, contact night-coverage [email or web address]  CC: Primary care physician; Soles, Howell Rucks, MD

## 2022-08-13 NOTE — Progress Notes (Signed)
Venous return impeded due to the TR band. Pt's hand started swelling and pt complaining of numbness on affected hand. Dr Clayborn Bigness made aware. TR bad removed and manual pressure applied for 30 mins. Dr Clayborn Bigness at bedside and manually applied pressure for another 20 mins. By then hemostasis obtained. 4x4 dressing with clear dressing appplied.

## 2022-08-13 NOTE — Assessment & Plan Note (Signed)
-   The patient will be hydrated with IV normal saline. - Will follow BMP. - We will avoid nephrotoxins.

## 2022-08-13 NOTE — Progress Notes (Signed)
[facility] Progress Note  Patient: Stacey Francis    [id number]  DOA: 08/13/2022     Date of Service: the patient was seen and examined on 08/13/2022  Chief Complaint  Patient presents with   Chest Pain   Brief hospital course: ABYGAIL Stacey Francis is a 67 y.o. female with PMH of HTN, presented to the emergency room with acute onset of chest pain that started at 11:30 PM that felt like pressure sensation over her entire chest and radiating to her neck.  She had associated diaphoresis without nausea or vomiting.    EKG suspicious for STEMI with inferior ST segment elevation and reciprocal T wave inversion in V1 through V3 per EMS. By the time the patient came to the ER EKG was much better.  Dr. Clayborn Bigness was called for code STEMI. The patient was taken to the Cath Lab and underwent PCI and 2 stents placement in the RCA and LCA. She is admitted to an ICU bed for further evaluation and management.     Assessment and Plan: STEMI (ST elevation myocardial infarction)  S/p PCI, RCA and LCx stents placement. Continue aspirin 81 mg p.o. daily and Brilinta 90 mg p.o. twice daily Continue Lopressor 12.5 mg p.o. twice daily, lisinopril 2.5 mg p.o. daily, Lipitor  80 mg p.o. daily.  LDL 200 elevated, repeat lipid profile after 3 months Continue as needed nitroglycerin and morphine IV  Follow-up with cardiology for further recommendation and discharge planning   AKI (acute kidney injury) ([place]) - The patient will be hydrated with IV normal saline. - Will follow BMP. - We will avoid nephrotoxins. Cr 1.54--1.29    Hypokalemia - Potassium was replaced. Monitor electrolytes and replete as needed.    Essential hypertension Continue Lopressor and lisinopril We will continue monitor BP and titrate medications accordingly  - We will continue his antihypertensives.   Dyslipidemia Continue Lipitor 80 mg p.o. daily LDL 200, repeat lipid profile after 3 months    Body mass index is 21.81  kg/m.  Interventions:     Diet: Heart healthy diet DVT Prophylaxis: SCD's, on DAPT  Advance goals of care discussion: Full code  Family Communication: family was not present at bedside, at the time of interview.  The pt provided permission to discuss medical plan with the family. Opportunity was given to ask question and all questions were answered satisfactorily.   Disposition:  Pt is from Home, admitted with STEMI, cardio following, which precludes a safe discharge. Discharge to Home, when cleared by cardiology.  Subjective: No significant events overnight, patient feels little chest discomfort, denies any chest pain or chest pressure.  Denies any palpitations, no shortness of breath  Physical Exam: General: NAD, lying comfortably Appear in no distress, affect appropriate Eyes: PERRLA ENT: Oral Mucosa Clear, moist  Neck: no JVD,  Cardiovascular: S1 and S2 Present, no Murmur,  Respiratory: good respiratory effort, Bilateral Air entry equal and Decreased, no Crackles, no wheezes Abdomen: Bowel Sound present, Soft and no tenderness,  Skin: no rashes Extremities: no Pedal edema, no calf tenderness Neurologic: without any new focal findings Gait not checked due to patient safety concerns  Vitals:   08/13/22 1400 08/13/22 1500 08/13/22 1600 08/13/22 1700  BP: 132/61 137/65 125/73 132/60  Pulse: 78 79 73 77  Resp: '18 16 18 '$ (!) 9  Temp:   98.1 F (36.7 C)   TempSrc:   Axillary   SpO2: 100% 99% 100% 98%  Weight:      Height:  Intake/Output Summary (Last 24 hours) at 08/13/2022 1734 Last data filed at 08/13/2022 1700 Gross per 24 hour  Intake 4174.91 ml  Output 2400 ml  Net 1774.91 ml   Filed Weights   08/13/22 0012  Weight: 55.8 kg    Data Reviewed: I have personally reviewed and interpreted daily labs, tele strips, imagings as discussed above. I reviewed all nursing notes, pharmacy notes, vitals, pertinent old records I have discussed plan of care as  described above with RN and patient/family.  CBC: Recent Labs  Lab 08/13/22 0017 08/13/22 0338  WBC 8.9 10.8*  NEUTROABS 3.8  --   HGB 12.6 11.2*  HCT 37.2 34.1*  MCV 86.9 89.7  PLT 249 [id number]   Basic Metabolic Panel: Recent Labs  Lab 08/13/22 0017 08/13/22 0117 08/13/22 0338  NA 136  --  136  K 3.0*  --  3.8  CL 103  --  105  CO2 21*  --  23  GLUCOSE 155*  --  130*  BUN 22  --  21  CREATININE 1.54*  --  1.29*  CALCIUM 9.2  --  8.0*  MG  --  2.1  --     Studies: ECHOCARDIOGRAM COMPLETE  Result Date: 08/13/2022    ECHOCARDIOGRAM REPORT   Patient Name:   [name] Date of Exam: 08/13/2022 Medical Rec #:  [id number]       Height:       63.0 in Accession #:    [id number]      Weight:       123.1 lb Date of Birth:  09/22/1955       BSA:          1.573 m Patient Age:    18 years        BP:           129/68 mmHg Patient Gender: F               HR:           81 bpm. Exam Location:  [facility] Procedure: 2D Echo, Color Doppler and Cardiac Doppler Indications:     Acute Myocardial Infarction I21.9  History:         Patient has no prior history of Echocardiogram examinations.                  Risk Factors:Hypertension.  Sonographer:     L. Thornton-Maynard Referring Phys:  [id number] DWAYNE D CALLWOOD Diagnosing Phys: Yolonda Kida MD IMPRESSIONS  1. Mild inferior lateral hypo.  2. Left ventricular ejection fraction, by estimation, is 65 to 70%. The left ventricle has normal function. The left ventricle has no regional wall motion abnormalities. Left ventricular diastolic parameters are consistent with Grade I diastolic dysfunction (impaired relaxation).  3. Right ventricular systolic function is normal. The right ventricular size is normal.  4. The mitral valve is normal in structure. Trivial mitral valve regurgitation.  5. The aortic valve is normal in structure. Aortic valve regurgitation is mild. FINDINGS  Left Ventricle: Left ventricular ejection fraction, by estimation, is 65 to 70%. The left  ventricle has normal function. The left ventricle has no regional wall motion abnormalities. The left ventricular internal cavity size was normal in size. There is  no left ventricular hypertrophy. Left ventricular diastolic parameters are consistent with Grade I diastolic dysfunction (impaired relaxation). Right Ventricle: The right ventricular size is normal. No increase in right ventricular wall thickness. Right ventricular systolic function is normal. Left Atrium:  Left atrial size was normal in size. Right Atrium: Right atrial size was normal in size. Pericardium: There is no evidence of pericardial effusion. Mitral Valve: The mitral valve is normal in structure. Trivial mitral valve regurgitation. Tricuspid Valve: The tricuspid valve is normal in structure. Tricuspid valve regurgitation is trivial. Aortic Valve: The aortic valve is normal in structure. Aortic valve regurgitation is mild. Aortic regurgitation PHT measures 413 msec. Aortic valve mean gradient measures 4.0 mmHg. Aortic valve peak gradient measures 7.3 mmHg. Aortic valve area, by VTI measures 2.62 cm. Pulmonic Valve: The pulmonic valve was normal in structure. Pulmonic valve regurgitation is not visualized. Aorta: The ascending aorta was not well visualized. IAS/Shunts: No atrial level shunt detected by color flow Doppler. Additional Comments: Mild inferior lateral hypo.  LEFT VENTRICLE PLAX 2D LVIDd:         3.70 cm     Diastology LVIDs:         2.40 cm     LV e' medial:    6.85 cm/s LV PW:         0.90 cm     LV E/e' medial:  11.2 LV IVS:        0.90 cm     LV e' lateral:   11.30 cm/s LVOT diam:     1.80 cm     LV E/e' lateral: 6.8 LV SV:         68 LV SV Index:   44 LVOT Area:     2.54 cm  LV Volumes (MOD) LV vol d, MOD A2C: 48.9 ml LV vol d, MOD A4C: 47.1 ml LV vol s, MOD A2C: 16.3 ml LV vol s, MOD A4C: 13.9 ml LV SV MOD A2C:     32.6 ml LV SV MOD A4C:     47.1 ml LV SV MOD BP:      33.3 ml RIGHT VENTRICLE RV Basal diam:  2.30 cm RV S prime:      18.60 cm/s TAPSE (M-mode): 2.4 cm LEFT ATRIUM             Index        RIGHT ATRIUM          Index LA diam:        2.60 cm 1.65 cm/m   RA Area:     9.15 cm LA Vol (A2C):   29.6 ml 18.81 ml/m  RA Volume:   15.50 ml 9.85 ml/m LA Vol (A4C):   26.9 ml 17.10 ml/m LA Biplane Vol: 28.1 ml 17.86 ml/m  AORTIC VALVE                    PULMONIC VALVE AV Area (Vmax):    2.15 cm     PV Vmax:          0.91 m/s AV Area (Vmean):   2.47 cm     PV Peak grad:     3.3 mmHg AV Area (VTI):     2.62 cm     PR End Diast Vel: 2.29 msec AV Vmax:           135.00 cm/s AV Vmean:          90.500 cm/s AV VTI:            0.261 m AV Peak Grad:      7.3 mmHg AV Mean Grad:      4.0 mmHg LVOT Vmax:         114.00 cm/s LVOT Vmean:  88.000 cm/s LVOT VTI:          0.269 m LVOT/AV VTI ratio: 1.03 AI PHT:            413 msec  AORTA Ao Root diam: 3.10 cm Ao Asc diam:  3.50 cm MITRAL VALVE MV Area (PHT): 5.54 cm     SHUNTS MV Decel Time: 137 msec     Systemic VTI:  0.27 m MV E velocity: 77.00 cm/s   Systemic Diam: 1.80 cm MV A velocity: 113.00 cm/s MV E/A ratio:  0.68 Dwayne D Callwood MD Electronically signed by Yolonda Kida MD Signature Date/Time: 08/13/2022/1:15:35 PM    Final     Scheduled Meds:  [START ON 08/14/2022] aspirin  81 mg Oral Daily   atorvastatin  80 mg Oral Daily   Chlorhexidine Gluconate Cloth  6 each Topical Daily   [START ON 08/14/2022] levothyroxine  25 mcg Oral Q0600   lisinopril  2.5 mg Oral Daily   metoprolol tartrate  12.5 mg Oral BID    morphine injection  4 mg Intravenous Once   ondansetron (ZOFRAN) IV  4 mg Intravenous Once   sodium chloride flush  3 mL Intravenous Q12H   ticagrelor  90 mg Oral BID   Continuous Infusions:  sodium chloride     PRN Meds: sodium chloride, acetaminophen, nitroGLYCERIN, ondansetron (ZOFRAN) IV, sodium chloride flush  Time spent: 35 minutes  Author: Val Riles. MD [facility] 08/13/2022 5:34 PM  To reach On-call, see care teams to locate the  attending and reach out to them via [email or web address]. If 7PM-7AM, please contact night-coverage If you still have difficulty reaching the attending provider, please page the [facility] (Director on Call) for [facility] on amion for assistance.

## 2022-08-13 NOTE — ED Provider Notes (Signed)
[facility] Provider Note    Event Date/Time   First MD Initiated Contact with Patient 08/13/22 0020     (approximate)   History   Chest Pain   HPI  Stacey Francis is a 67 y.o. female with history of lightheadedness who comes in with concerns for chest pain.  Patient reports chest pain started at 1130, pressure sensation over her entire chest up into her neck.  She denies it being radiating into her back denies any numbness or tingling.  She denies any falls hitting her head or any issues with getting blood thinners.  Denies any rectal bleeding.  She denies any abdominal pain.  She denies ever having anything like this previously.  No history of stents put in before.  Physical Exam   Triage Vital Signs: ED Triage Vitals  Enc Vitals Group     BP 08/13/22 0015 129/68     Pulse Rate 08/13/22 0015 79     Resp --      Temp --      Temp src --      SpO2 08/13/22 0010 96 %     Weight 08/13/22 0012 123 lb 1.6 oz (55.8 kg)     Height 08/13/22 0012 '5\' 3"'$  (1.6 m)     Head Circumference --      Peak Flow --      Pain Score 08/13/22 0012 7     Pain Loc --      Pain Edu? --      Excl. in [place]? --     Most recent vital signs: Vitals:   08/13/22 0010 08/13/22 0015  BP:  129/68  Pulse:  79  SpO2: 96% 100%     General: Awake, no distress.  CV:  Good peripheral perfusion.  Resp:  Normal effort.  Abd:  No distention.  Other:  Good distal pulses throughout.  Abdomen soft and nontender clear lungs bilaterally.   ED Results / Procedures / Treatments   Labs (all labs ordered are listed, but only abnormal results are displayed) Labs Reviewed  RESP PANEL BY RT-PCR (RSV, FLU A&B, COVID)  [id number]  CBC WITH DIFFERENTIAL/PLATELET  PROTIME-INR  APTT  COMPREHENSIVE METABOLIC PANEL  LIPID PANEL  TROPONIN I (HIGH SENSITIVITY)     EKG  My interpretation of EKG:  Sinus rate of 77 without significant ST elevation except for may be half a millimeter in the  inferior leads, normal intervals  Pete EKG sinus rate of 75 with similar ST changes, T wave inversions are not apparent, normal intervals  I reviewed the EKG from EMS for patient did have ST elevation in the inferior leads with T wave version aVL V2 and V3, normal intervals   PROCEDURES:  Critical Care performed: Yes, see critical care procedure note(s)  .1-3 Lead EKG Interpretation  Performed by: Vanessa [place], MD Authorized by: Vanessa [place], MD     Interpretation: normal     ECG rate:  70   ECG rate assessment: normal     Rhythm: sinus rhythm     Ectopy: none     Conduction: normal   .Critical Care  Performed by: Vanessa [place], MD Authorized by: Vanessa , MD   Critical care provider statement:    Critical care time (minutes):  20   Critical care was necessary to treat or prevent imminent or life-threatening deterioration of the following conditions:  Cardiac failure   Critical care was time spent personally  by me on the following activities:  Development of treatment plan with patient or surrogate, discussions with consultants, evaluation of patient's response to treatment, examination of patient, ordering and review of laboratory studies, ordering and review of radiographic studies, ordering and performing treatments and interventions, pulse oximetry, re-evaluation of patient's condition and review of old [place] ED: Medications  heparin injection 4,000 Units (has no administration in time range)  sodium chloride 0.9 % bolus 500 mL (has no administration in time range)  ticagrelor (BRILINTA) tablet 180 mg (has no administration in time range)  nitroGLYCERIN (NITROSTAT) SL tablet 0.4 mg (has no administration in time range)     IMPRESSION / MDM / ASSESSMENT AND PLAN / ED COURSE  I reviewed the triage vital signs and the nursing notes.   Patient's presentation is most consistent with acute presentation with potential threat to life or  bodily function.   Patient comes in as a STEMI from the field- Dr Clayborn Bigness at bedside.  Breath sounds bilaterally oxygen level 100% doubt PE or pneumothorax.  No signs of dissection based upon examination and history.  Discussed with cardiology and ordered Brilinta nitro, heparin, fluids.  Patient already received full dose aspirin in the field.  Patient will be going to the Cath Lab  The patient is on the cardiac monitor to evaluate for evidence of arrhythmia and/or significant heart rate changes.      FINAL CLINICAL IMPRESSION(S) / ED DIAGNOSES   Final diagnoses:  ST elevation myocardial infarction involving right coronary artery ([place])     Rx / DC Orders   ED Discharge Orders     None        Note:  This document was prepared using Dragon voice recognition software and may include unintentional dictation errors.   Vanessa [place], MD 08/13/22 [phone]

## 2022-08-13 NOTE — Assessment & Plan Note (Signed)
-   We will continue his antihypertensives. 

## 2022-08-13 NOTE — ED Notes (Signed)
Cardio at bedside

## 2022-08-13 NOTE — Assessment & Plan Note (Signed)
-   Potassium will be replaced and followed. - We will check magnesium level.

## 2022-08-13 NOTE — Assessment & Plan Note (Signed)
-   The patient underwent PCI and RCA and LCx stents placement. - She will be admitted to an ICU bed. - She is placed on IV Kengreal in addition to as needed sublingual nitroglycerin and IV morphine sulfate, beta-blocker therapy with Lopressor and ACE inhibitor therapy with Zestril. -We will continue aspirin high-dose statin therapy.

## 2022-08-13 NOTE — Plan of Care (Signed)
Problem: Education: Goal: Knowledge of General Education information will improve Description: Including pain rating scale, medication(s)/side effects and non-pharmacologic comfort measures Outcome: Progressing   Problem: Health Behavior/Discharge Planning: Goal: Ability to manage health-related needs will improve Outcome: Progressing   Problem: Clinical Measurements: Goal: Ability to maintain clinical measurements within normal limits will improve Outcome: Progressing Goal: Will remain free from infection Outcome: Progressing Goal: Diagnostic test results will improve Outcome: Progressing Goal: Respiratory complications will improve Outcome: Progressing Goal: Cardiovascular complication will be avoided Outcome: Progressing   Problem: Activity: Goal: Risk for activity intolerance will decrease Outcome: Progressing   Problem: Nutrition: Goal: Adequate nutrition will be maintained Outcome: Progressing   Problem: Coping: Goal: Level of anxiety will decrease Outcome: Progressing   Problem: Coping: Goal: Level of anxiety will decrease Outcome: Progressing   Problem: Elimination: Goal: Will not experience complications related to bowel motility Outcome: Progressing Goal: Will not experience complications related to urinary retention Outcome: Progressing   Problem: Pain Managment: Goal: General experience of comfort will improve Outcome: Progressing   Problem: Safety: Goal: Ability to remain free from injury will improve Outcome: Progressing   Problem: Skin Integrity: Goal: Risk for impaired skin integrity will decrease Outcome: Progressing   Problem: Education: Goal: Understanding of CV disease, CV risk reduction, and recovery process will improve Outcome: Progressing Goal: Individualized Educational Video(s) Outcome: Progressing   Problem: Activity: Goal: Ability to return to baseline activity level will improve Outcome: Progressing   Problem:  Cardiovascular: Goal: Ability to achieve and maintain adequate cardiovascular perfusion will improve Outcome: Progressing Goal: Vascular access site(s) Level 0-1 will be maintained Outcome: Progressing   Problem: Health Behavior/Discharge Planning: Goal: Ability to safely manage health-related needs after discharge will improve Outcome: Progressing

## 2022-08-13 NOTE — Progress Notes (Signed)
   08/13/22 0044  Spiritual Encounters  Type of Visit Initial  Care provided to: Family  Referral source Code page  Reason for visit Code  OnCall Visit Yes   Chaplain responded to Code STEMI and provided assistance and direction for family to Cath waiting area.

## 2022-08-14 ENCOUNTER — Encounter: Payer: Self-pay | Admitting: Internal Medicine

## 2022-08-14 DIAGNOSIS — I213 ST elevation (STEMI) myocardial infarction of unspecified site: Secondary | ICD-10-CM | POA: Diagnosis not present

## 2022-08-14 DIAGNOSIS — I5030 Unspecified diastolic (congestive) heart failure: Secondary | ICD-10-CM | POA: Diagnosis not present

## 2022-08-14 DIAGNOSIS — I2510 Atherosclerotic heart disease of native coronary artery without angina pectoris: Secondary | ICD-10-CM | POA: Diagnosis not present

## 2022-08-14 LAB — CBC
HCT: 32.1 % — ABNORMAL LOW (ref 36.0–46.0)
Hemoglobin: 10.6 g/dL — ABNORMAL LOW (ref 12.0–15.0)
MCH: 29.3 pg (ref 26.0–34.0)
MCHC: 33 g/dL (ref 30.0–36.0)
MCV: 88.7 fL (ref 80.0–100.0)
Platelets: 195 10*3/uL (ref 150–400)
RBC: 3.62 MIL/uL — ABNORMAL LOW (ref 3.87–5.11)
RDW: 12.6 % (ref 11.5–15.5)
WBC: 7 10*3/uL (ref 4.0–10.5)
nRBC: 0 % (ref 0.0–0.2)

## 2022-08-14 LAB — MAGNESIUM: Magnesium: 1.9 mg/dL (ref 1.7–2.4)

## 2022-08-14 LAB — BASIC METABOLIC PANEL
Anion gap: 5 (ref 5–15)
BUN: 13 mg/dL (ref 8–23)
CO2: 23 mmol/L (ref 22–32)
Calcium: 8.5 mg/dL — ABNORMAL LOW (ref 8.9–10.3)
Chloride: 108 mmol/L (ref 98–111)
Creatinine, Ser: 0.92 mg/dL (ref 0.44–1.00)
GFR, Estimated: >60 mL/min (ref >60)
Glucose, Bld: 105 mg/dL — ABNORMAL HIGH (ref 70–99)
Potassium: 3.9 mmol/L (ref 3.5–5.1)
Sodium: 136 mmol/L (ref 135–145)

## 2022-08-14 LAB — HEMOGLOBIN A1C
Hgb A1c MFr Bld: 5.7 % — ABNORMAL HIGH (ref 4.8–5.6)
Mean Plasma Glucose: 117 mg/dL

## 2022-08-14 LAB — GLUCOSE, CAPILLARY: Glucose-Capillary: 90 mg/dL (ref 70–99)

## 2022-08-14 LAB — CARDIAC CATHETERIZATION: Cath EF Quantitative: 60 %

## 2022-08-14 LAB — PHOSPHORUS: Phosphorus: 3.6 mg/dL (ref 2.5–4.6)

## 2022-08-14 MED ORDER — MECLIZINE HCL 25 MG PO TABS: 25.0000 mg | ORAL_TABLET | Freq: Three times a day (TID) | ORAL | Status: DC | PRN

## 2022-08-14 NOTE — TOC Initial Note (Signed)
Transition of Care [facility]) - Initial/Assessment Note    Patient Details  Name: Stacey Francis MRN: [id number] Date of Birth: 10-19-1955  [facility] of [facility]) CM/SW Contact:    Laurena Slimmer, RN Phone Number: 08/14/2022, 10:15 AM  Clinical Narrative:                  Transition of Care [facility]) Screening Note   Patient Details  Name: Stacey Francis Date of Birth: 01/30/1956   Transition of Care [facility]) CM/SW Contact:    Laurena Slimmer, RN Phone Number: 08/14/2022, 10:15 AM    Transition of Care Department [facility]) has reviewed patient and no TOC needs have been identified at this time. We will continue to monitor patient advancement through interdisciplinary progression rounds. If new patient transition needs arise, please place a TOC consult.          Patient Goals and CMS Choice            Expected Discharge Plan and Services                                              Prior Living Arrangements/Services                       Activities of Daily Living Home Assistive Devices/Equipment: Dentures (specify type), Blood pressure cuff ADL Screening (condition at time of admission) Patient's cognitive ability adequate to safely complete daily activities?: Yes Is the patient deaf or have difficulty hearing?: No Does the patient have difficulty seeing, even when wearing glasses/contacts?: No Does the patient have difficulty concentrating, remembering, or making decisions?: No Patient able to express need for assistance with ADLs?: Yes Does the patient have difficulty dressing or bathing?: No Independently performs ADLs?: Yes (appropriate for developmental age) Does the patient have difficulty walking or climbing stairs?: No Weakness of Legs: None Weakness of Arms/Hands: None  Permission Sought/Granted                  Emotional Assessment              Admission diagnosis:  ST elevation myocardial infarction involving right coronary  artery ([place]) [I21.11] STEMI (ST elevation myocardial infarction) ([place]) [I21.3] Patient Active Problem List   Diagnosis Date Noted   STEMI (ST elevation myocardial infarction) ([place]) 08/13/2022   Essential hypertension 08/13/2022   Dyslipidemia 08/13/2022   AKI (acute kidney injury) ([place]) 08/13/2022   Hypokalemia 08/13/2022   PCP:  Herminio Commons, MD Pharmacy:   [id number] [id number]: [phone]: [phone]    Social Determinants of Health (SDOH) Social History: SDOH Screenings   Food Insecurity: No Food Insecurity (08/13/2022)  Housing: Low Risk  (08/13/2022)  Transportation Needs: No Transportation Needs (08/13/2022)  Utilities: Not At Risk (08/13/2022)  Tobacco Use: Low Risk  (08/13/2022)   SDOH Interventions:     Readmission Risk Interventions     No data to display

## 2022-08-14 NOTE — Progress Notes (Signed)
[place] NOTE       Patient ID: Stacey Francis MRN: [id number] DOB/AGE: 07-04-1955 67 y.o.  Admit date: 08/13/2022 Referring Physician Dr. Marjean Donna Primary Physician [facility] Primary Cardiologist Dr. Nehemiah Massed Reason for Consultation inferior STEMI  HPI: Stacey Francis is a 67yoF with a PMH of HTN, hypothyroidism who presented to [facility] ED the early morning of 3/10 with chest pressure that radiated up to her neck. Initial EKG showed inferior STE and reciprocal changes anterolaterally. Code STEMI was activated in the field by EMS and she was taken emergently to the cath lab overnight by Dr. Clayborn Bigness.  LHC revealed 99% Lcx, 95% distal RCA which were both treated with PCI with DES.  Residual 95% proximal and 75% distal LAD that was felt best to be treated medically initially and staged at a later date.  Post procedurally, the patient developed a hematoma on her right wrist with significant swelling into her hand which improved following deflation of the TR band. Echo with preserved LVEF and grade 1 diastolic dysfunction  Interval history: -Remains chest pressure free today -Seen and examined standing and ambulating within her shared room -Right hand/wrist swelling improved compared to yesterday  Review of systems complete and found to be negative unless listed above     Past Medical History:  Diagnosis Date   Hypertension    Sinus infection     Past Surgical History:  Procedure Laterality Date   ECTOPIC PREGNANCY SURGERY      Medications Prior to Admission  Medication Sig Dispense Refill Last Dose   levothyroxine (SYNTHROID) 25 MCG tablet Take 25 mcg by mouth daily before breakfast.   08/12/2022   naproxen (NAPROSYN) 500 MG tablet Take 500 mg by mouth 2 (two) times daily with a meal.   08/12/2022   Social History   Socioeconomic History   Marital status: Divorced    Spouse name: Not on file   Number of children: Not on file    Years of education: Not on file   Highest education level: Not on file  Occupational History   Not on file  Tobacco Use   Smoking status: Never   Smokeless tobacco: Never  Vaping Use   Vaping Use: Never used  Substance and Sexual Activity   Alcohol use: No   Drug use: No   Sexual activity: Not on file  Other Topics Concern   Not on file  Social History Narrative   Not on file   Social Determinants of Health   Financial Resource Strain: Not on file  Food Insecurity: No Food Insecurity (08/13/2022)   Hunger Vital Sign    Worried About Running Out of Food in the Last Year: Never true    Ran Out of Food in the Last Year: Never true  Transportation Needs: No Transportation Needs (08/13/2022)   PRAPARE - Hydrologist (Medical): No    Lack of Transportation (Non-Medical): No  Physical Activity: Not on file  Stress: Not on file  Social Connections: Not on file  Intimate Partner Violence: Not At Risk (08/13/2022)   Humiliation, Afraid, Rape, and Kick questionnaire    Fear of Current or Ex-Partner: No    Emotionally Abused: No    Physically Abused: No    Sexually Abused: No    History reviewed. No pertinent family history.    Intake/Output Summary (Last 24 hours) at 08/14/2022 0815 Last data filed at 08/14/2022 0232 Gross per 24 hour  Intake 1807.95 ml  Output 2550 ml  Net -742.05 ml    Vitals:   08/14/22 0100 08/14/22 0200 08/14/22 0232 08/14/22 0300  BP: 112/69 (!) 93/59 112/74 (!) 121/57  Pulse: 66 70 78 76  Resp: '18 18 20 20  '$ Temp:    98.2 F (36.8 C)  TempSrc:    Oral  SpO2: 98% 98% 100% 100%  Weight:      Height:        PHYSICAL EXAM General: Thin middle-aged Caucasian female, well nourished, in no acute distress.  Initially standing and ambulating without assistance in a shared room in PCU. HEENT:  Normocephalic and atraumatic. Neck:  No JVD.  Lungs: Normal respiratory effort on room air. Clear bilaterally to auscultation. No  wheezes, crackles, rhonchi.  Heart: HRRR . Normal S1 and S2 without gallops or murmurs.  Abdomen: Non-distended appearing.  Msk: Normal strength and tone for age. Extremities: Warm and well perfused. No clubbing, cyanosis.  No lower extremity edema.  Right wrist: Gauze and Tegaderm covering arteriotomy with significant generalized swelling into the right hand and ecchymosis extending proximally with overall improvement compared to yesterday.  Right radial pulse intact distally. Cap refill brisk, neurovascularly intact  Neuro: Alert and oriented X 3. Psych:  Answers questions appropriately.   Labs: Basic Metabolic Panel: Recent Labs    08/13/22 0117 08/13/22 0338 08/14/22 0430  NA  --  136 136  K  --  3.8 3.9  CL  --  105 108  CO2  --  23 23  GLUCOSE  --  130* 105*  BUN  --  21 13  CREATININE  --  1.29* 0.92  CALCIUM  --  8.0* 8.5*  MG 2.1  --  1.9  PHOS  --   --  3.6   Liver Function Tests: Recent Labs    08/13/22 0017  AST 21  ALT 12  ALKPHOS 49  BILITOT 0.9  PROT 7.2  ALBUMIN 4.1   No results for input(s): "LIPASE", "AMYLASE" in the last 72 hours. CBC: Recent Labs    08/13/22 0017 08/13/22 0338 08/14/22 0430  WBC 8.9 10.8* 7.0  NEUTROABS 3.8  --   --   HGB 12.6 11.2* 10.6*  HCT 37.2 34.1* 32.1*  MCV 86.9 89.7 88.7  PLT 249 194 195   Cardiac Enzymes: Recent Labs    08/13/22 0017 08/13/22 0351 08/13/22 0600  TROPONINIHS 3 117* 664*   BNP: No results for input(s): "BNP" in the last 72 hours. D-Dimer: No results for input(s): "DDIMER" in the last 72 hours. Hemoglobin A1C: No results for input(s): "HGBA1C" in the last 72 hours. Fasting Lipid Panel: Recent Labs    08/13/22 0017  CHOL 298*  HDL 84  LDLCALC 200*  TRIG 70  CHOLHDL 3.5   Thyroid Function Tests: No results for input(s): "TSH", "T4TOTAL", "T3FREE", "THYROIDAB" in the last 72 hours.  Invalid input(s): "FREET3" Anemia Panel: No results for input(s): "VITAMINB12", "FOLATE",  "FERRITIN", "TIBC", "IRON", "RETICCTPCT" in the last 72 hours.   Radiology: ECHOCARDIOGRAM COMPLETE  Result Date: 08/13/2022    ECHOCARDIOGRAM REPORT   Patient Name:   Stacey Francis Date of Exam: 08/13/2022 Medical Rec #:  [id number]       Height:       63.0 in Accession #:    [id number]      Weight:       123.1 lb Date of Birth:  03-07-1956       BSA:  1.573 m Patient Age:    50 years        BP:           129/68 mmHg Patient Gender: F               HR:           81 bpm. Exam Location:  [facility] Procedure: 2D Echo, Color Doppler and Cardiac Doppler Indications:     Acute Myocardial Infarction I21.9  History:         Patient has no prior history of Echocardiogram examinations.                  Risk Factors:Hypertension.  Sonographer:     L. Thornton-Maynard Referring Phys:  [id number] DWAYNE D CALLWOOD Diagnosing Phys: Yolonda Kida MD IMPRESSIONS  1. Mild inferior lateral hypo.  2. Left ventricular ejection fraction, by estimation, is 65 to 70%. The left ventricle has normal function. The left ventricle has no regional wall motion abnormalities. Left ventricular diastolic parameters are consistent with Grade I diastolic dysfunction (impaired relaxation).  3. Right ventricular systolic function is normal. The right ventricular size is normal.  4. The mitral valve is normal in structure. Trivial mitral valve regurgitation.  5. The aortic valve is normal in structure. Aortic valve regurgitation is mild. FINDINGS  Left Ventricle: Left ventricular ejection fraction, by estimation, is 65 to 70%. The left ventricle has normal function. The left ventricle has no regional wall motion abnormalities. The left ventricular internal cavity size was normal in size. There is  no left ventricular hypertrophy. Left ventricular diastolic parameters are consistent with Grade I diastolic dysfunction (impaired relaxation). Right Ventricle: The right ventricular size is normal. No increase in right ventricular wall thickness.  Right ventricular systolic function is normal. Left Atrium: Left atrial size was normal in size. Right Atrium: Right atrial size was normal in size. Pericardium: There is no evidence of pericardial effusion. Mitral Valve: The mitral valve is normal in structure. Trivial mitral valve regurgitation. Tricuspid Valve: The tricuspid valve is normal in structure. Tricuspid valve regurgitation is trivial. Aortic Valve: The aortic valve is normal in structure. Aortic valve regurgitation is mild. Aortic regurgitation PHT measures 413 msec. Aortic valve mean gradient measures 4.0 mmHg. Aortic valve peak gradient measures 7.3 mmHg. Aortic valve area, by VTI measures 2.62 cm. Pulmonic Valve: The pulmonic valve was normal in structure. Pulmonic valve regurgitation is not visualized. Aorta: The ascending aorta was not well visualized. IAS/Shunts: No atrial level shunt detected by color flow Doppler. Additional Comments: Mild inferior lateral hypo.  LEFT VENTRICLE PLAX 2D LVIDd:         3.70 cm     Diastology LVIDs:         2.40 cm     LV e' medial:    6.85 cm/s LV PW:         0.90 cm     LV E/e' medial:  11.2 LV IVS:        0.90 cm     LV e' lateral:   11.30 cm/s LVOT diam:     1.80 cm     LV E/e' lateral: 6.8 LV SV:         68 LV SV Index:   44 LVOT Area:     2.54 cm  LV Volumes (MOD) LV vol d, MOD A2C: 48.9 ml LV vol d, MOD A4C: 47.1 ml LV vol s, MOD A2C: 16.3 ml LV vol s, MOD A4C: 13.9  ml LV SV MOD A2C:     32.6 ml LV SV MOD A4C:     47.1 ml LV SV MOD BP:      33.3 ml RIGHT VENTRICLE RV Basal diam:  2.30 cm RV S prime:     18.60 cm/s TAPSE (M-mode): 2.4 cm LEFT ATRIUM             Index        RIGHT ATRIUM          Index LA diam:        2.60 cm 1.65 cm/m   RA Area:     9.15 cm LA Vol (A2C):   29.6 ml 18.81 ml/m  RA Volume:   15.50 ml 9.85 ml/m LA Vol (A4C):   26.9 ml 17.10 ml/m LA Biplane Vol: 28.1 ml 17.86 ml/m  AORTIC VALVE                    PULMONIC VALVE AV Area (Vmax):    2.15 cm     PV Vmax:          0.91 m/s AV  Area (Vmean):   2.47 cm     PV Peak grad:     3.3 mmHg AV Area (VTI):     2.62 cm     PR End Diast Vel: 2.29 msec AV Vmax:           135.00 cm/s AV Vmean:          90.500 cm/s AV VTI:            0.261 m AV Peak Grad:      7.3 mmHg AV Mean Grad:      4.0 mmHg LVOT Vmax:         114.00 cm/s LVOT Vmean:        88.000 cm/s LVOT VTI:          0.269 m LVOT/AV VTI ratio: 1.03 AI PHT:            413 msec  AORTA Ao Root diam: 3.10 cm Ao Asc diam:  3.50 cm MITRAL VALVE MV Area (PHT): 5.54 cm     SHUNTS MV Decel Time: 137 msec     Systemic VTI:  0.27 m MV E velocity: 77.00 cm/s   Systemic Diam: 1.80 cm MV A velocity: 113.00 cm/s MV E/A ratio:  0.68 Dwayne D Callwood MD Electronically signed by Yolonda Kida MD Signature Date/Time: 08/13/2022/1:15:35 PM    Final    MR BRAIN WO CONTRAST  Result Date: 07/30/2022 CLINICAL DATA:  Altered mental status and dizziness EXAM: MRI HEAD WITHOUT CONTRAST TECHNIQUE: Multiplanar, multiecho pulse sequences of the brain and surrounding structures were obtained without intravenous contrast. COMPARISON:  11/09/2020. FINDINGS: Brain: No acute infarct, mass effect or extra-axial collection. No chronic microhemorrhage or siderosis. There is multifocal hyperintense T2-weighted signal within the white matter. Parenchymal volume and CSF spaces are normal. The midline structures are normal. Vascular: Normal flow voids. Skull and upper cervical spine: Normal marrow signal. Sinuses/Orbits: Negative. Other: None. IMPRESSION: 1. No acute intracranial abnormality. 2. Multifocal hyperintense T2-weighted signal within the white matter, most consistent with chronic microvascular ischemia. Electronically Signed   By: Ulyses Jarred M.D.   On: 07/30/2022 21:05   CT HEAD WO CONTRAST (5MM)  Result Date: 07/30/2022 CLINICAL DATA:  Headache, new onset (Age >= 51y) EXAM: CT HEAD WITHOUT CONTRAST TECHNIQUE: Contiguous axial images were obtained from the base of the skull through the vertex  without  intravenous contrast. RADIATION DOSE REDUCTION: This exam was performed according to the departmental dose-optimization program which includes automated exposure control, adjustment of the mA and/or kV according to patient size and/or use of iterative reconstruction technique. COMPARISON:  01/12/2021 FINDINGS: Brain: No intracranial hemorrhage, mass effect, or midline shift. No hydrocephalus. The basilar cisterns are patent. Mild chronic small vessel ischemic change which is similar to prior exam. No evidence of territorial infarct or acute ischemia. No extra-axial or intracranial fluid collection. Vascular: No hyperdense vessel or unexpected calcification. Skull: Normal. Negative for fracture or focal lesion. Sinuses/Orbits: Paranasal sinuses and mastoid air cells are clear. The visualized orbits are unremarkable. Other: None. IMPRESSION: 1. No acute intracranial abnormality. 2. Mild chronic small vessel ischemic change. Electronically Signed   By: Keith Rake M.D.   On: 07/30/2022 18:51    TELEMETRY reviewed by me (LT) 08/14/2022 : NSR 70s-80s  EKG reviewed by me: inferior STE with reciprocal changes anterolaterally  Data reviewed by me (LT) 08/14/2022: ed note, admission H&P, nursing notes, last 24h vitals tele labs imaging I/O    Principal Problem:   STEMI (ST elevation myocardial infarction) ([place]) Active Problems:   Essential hypertension   Dyslipidemia   AKI (acute kidney injury) ([place])   Hypokalemia    ASSESSMENT AND PLAN:   Savi A. Tamala Francis is a 10yoF with a PMH of HTN, hypothyroidism who presented to [facility] ED the early morning of 3/10 with chest pressure that radiated up to her neck. Initial EKG showed inferior STE and reciprocal changes anterolaterally. Code STEMI was activated in the field by EMS and she was taken emergently to the cath lab overnight by Dr. Clayborn Bigness.  LHC revealed 99% Lcx, 95% distal RCA which were both treated with PCI with DES.  Residual 95% proximal and 75% distal  LAD that was felt best to be treated medically initially and staged at a later date.  Post procedurally, the patient developed a hematoma on her right wrist with significant swelling into her hand which improved following deflation of the TR band. Echo with preserved LVEF and grade 1 diastolic dysfunction  # Inferior STEMI # 3v CAD # Right wrist swelling S/p PCI with DES x 1 to the distal RCA, PCI with DES x 1 to the proximal LCx with resolution of chest pressure overnight on 3/10.  Fortunately chest pain-free today, plan for staged intervention vs continued medical management of disease in her LAD. -Continue aspirin 81 mg daily and Brilinta 90 mg twice daily uninterrupted for at least 1 year -Continue metoprolol tartrate 12.5 mg twice daily -Continue lisinopril 2.5 mg once daily -Continue atorvastatin 80 mg daily -Recommend keeping right hand elevated -Cardiac rehab at discharge -Will arrange for follow-up in clinic with Dr. Clayborn Bigness in 1 to 2 weeks for possible staged procedure of her LAD versus continued medical management  # HFpEF Euvolemic on exam.  GDMT with metoprolol and lisinopril, relative hypotension precludes further escalation of GDMT   This patient's plan of care was discussed and created with Dr. Saralyn Pilar and he is in agreement.  Signed: Tristan Schroeder , PA-C 08/14/2022, 8:15 AM [facility]

## 2022-08-14 NOTE — Progress Notes (Signed)
[facility] Progress Note  Patient: Stacey Francis    [id number]  DOA: 08/13/2022     Date of Service: the patient was seen and examined on 08/14/2022  Chief Complaint  Patient presents with   Chest Pain   Brief hospital course: Stacey Francis is a 67 y.o. female with PMH of HTN, presented to the emergency room with acute onset of chest pain that started at 11:30 PM that felt like pressure sensation over her entire chest and radiating to her neck.  She had associated diaphoresis without nausea or vomiting.    EKG suspicious for STEMI with inferior ST segment elevation and reciprocal T wave inversion in V1 through V3 per EMS. By the time the patient came to the ER EKG was much better.  Dr. Clayborn Bigness was called for code STEMI. The patient was taken to the Cath Lab and underwent PCI and 2 stents placement in the RCA and LCA. She is admitted to an ICU bed for further evaluation and management.     Assessment and Plan: # STEMI (ST elevation myocardial infarction)  S/p PCI, RCA and LCx stents placement. Continue aspirin 81 mg p.o. daily and Brilinta 90 mg p.o. twice daily Continue Lopressor 12.5 mg p.o. twice daily, lisinopril 2.5 mg p.o. daily, Lipitor  80 mg p.o. daily.  LDL 200 elevated, repeat lipid profile after 3 months Continue as needed nitroglycerin and morphine IV  For right hand/wrist swelling developed after cath, improving, keep wrist elevated. Follow-up with cardiology for further recommendation and discharge planning    # AKI (acute kidney injury), Resolved - The patient will be hydrated with IV normal saline. - Will follow BMP. - We will avoid nephrotoxins. Cr 1.54--1.29 --0.92   # Hypokalemia, Potassium was replaced.  Resolved Monitor electrolytes and replete as needed.    Essential hypertension Continue Lopressor and lisinopril We will continue monitor BP and titrate medications accordingly    Dyslipidemia Continue Lipitor 80 mg p.o. daily LDL 200,  repeat lipid profile after 3 months    Body mass index is 21.81 kg/m.  Interventions:     Diet: Heart healthy diet DVT Prophylaxis: SCD's, on DAPT  Advance goals of care discussion: Full code  Family Communication: family was not present at bedside, at the time of interview.  The pt provided permission to discuss medical plan with the family. Opportunity was given to ask question and all questions were answered satisfactorily.   Disposition:  Pt is from Home, admitted with STEMI, cardio following, which precludes a safe discharge. Discharge to Home, when cleared by cardiology.  Subjective: No significant events overnight, patient was ambulating well without any discomfort, no any chest pain or palpitation, no shortness of breath. Right hand and wrist swelling is improving, patient was advised to keep the wrist elevated. RN mentioned that patient had dizziness while at rest.  Meclizine as needed ordered.  We will continue to monitor  Physical Exam: General: NAD, lying comfortably Appear in no distress, affect appropriate Eyes: PERRLA ENT: Oral Mucosa Clear, moist  Neck: no JVD,  Cardiovascular: S1 and S2 Present, no Murmur,  Respiratory: good respiratory effort, Bilateral Air entry equal and Decreased, no Crackles, no wheezes Abdomen: Bowel Sound present, Soft and no tenderness,  Skin: no rashes Extremities: no Pedal edema, no calf tenderness Neurologic: without any new focal findings Gait not checked due to patient safety concerns  Vitals:   08/14/22 0232 08/14/22 0300 08/14/22 0853 08/14/22 1120  BP: 112/74 (!) 121/57 (!) 115/58 Marland Kitchen)  123/53  Pulse: 78 76 79 74  Resp: '20 20 14 18  '$ Temp:  98.2 F (36.8 C) 98.1 F (36.7 C) 98.2 F (36.8 C)  TempSrc:  Oral Oral Oral  SpO2: 100% 100% 100% 100%  Weight:      Height:        Intake/Output Summary (Last 24 hours) at 08/14/2022 1610 Last data filed at 08/14/2022 1430 Gross per 24 hour  Intake 1208.01 ml  Output 1450 ml   Net -241.99 ml   Filed Weights   08/13/22 0012  Weight: 55.8 kg    Data Reviewed: I have personally reviewed and interpreted daily labs, tele strips, imagings as discussed above. I reviewed all nursing notes, pharmacy notes, vitals, pertinent old records I have discussed plan of care as described above with RN and patient/family.  CBC: Recent Labs  Lab 08/13/22 0017 08/13/22 0338 08/14/22 0430  WBC 8.9 10.8* 7.0  NEUTROABS 3.8  --   --   HGB 12.6 11.2* 10.6*  HCT 37.2 34.1* 32.1*  MCV 86.9 89.7 88.7  PLT 249 194 0000000   Basic Metabolic Panel: Recent Labs  Lab 08/13/22 0017 08/13/22 0117 08/13/22 0338 08/14/22 0430  NA 136  --  136 136  K 3.0*  --  3.8 3.9  CL 103  --  105 108  CO2 21*  --  23 23  GLUCOSE 155*  --  130* 105*  BUN 22  --  21 13  CREATININE 1.54*  --  1.29* 0.92  CALCIUM 9.2  --  8.0* 8.5*  MG  --  2.1  --  1.9  PHOS  --   --   --  3.6    Studies: No results found.  Scheduled Meds:  aspirin  81 mg Oral Daily   atorvastatin  80 mg Oral Daily   levothyroxine  25 mcg Oral Q0600   lisinopril  2.5 mg Oral Daily   metoprolol tartrate  12.5 mg Oral BID   sodium chloride flush  3 mL Intravenous Q12H   ticagrelor  90 mg Oral BID   Continuous Infusions:  sodium chloride     PRN Meds: sodium chloride, acetaminophen, meclizine, nitroGLYCERIN, ondansetron (ZOFRAN) IV, sodium chloride flush  Time spent: 35 minutes  Author: Val Riles. MD [facility] 08/14/2022 4:10 PM  To reach On-call, see care teams to locate the attending and reach out to them via [email or web address]. If 7PM-7AM, please contact night-coverage If you still have difficulty reaching the attending provider, please page the [facility] (Director on Call) for [facility] on amion for assistance.

## 2022-08-15 DIAGNOSIS — I213 ST elevation (STEMI) myocardial infarction of unspecified site: Secondary | ICD-10-CM | POA: Diagnosis not present

## 2022-08-15 LAB — CBC
HCT: 36.8 % (ref 36.0–46.0)
Hemoglobin: 12.4 g/dL (ref 12.0–15.0)
MCH: 29.5 pg (ref 26.0–34.0)
MCHC: 33.7 g/dL (ref 30.0–36.0)
MCV: 87.6 fL (ref 80.0–100.0)
Platelets: 227 10*3/uL (ref 150–400)
RBC: 4.2 MIL/uL (ref 3.87–5.11)
RDW: 12.4 % (ref 11.5–15.5)
WBC: 6.3 10*3/uL (ref 4.0–10.5)
nRBC: 0 % (ref 0.0–0.2)

## 2022-08-15 LAB — BASIC METABOLIC PANEL
Anion gap: 4 — ABNORMAL LOW (ref 5–15)
BUN: 13 mg/dL (ref 8–23)
CO2: 24 mmol/L (ref 22–32)
Calcium: 8.6 mg/dL — ABNORMAL LOW (ref 8.9–10.3)
Chloride: 108 mmol/L (ref 98–111)
Creatinine, Ser: 0.78 mg/dL (ref 0.44–1.00)
GFR, Estimated: >60 mL/min (ref >60)
Glucose, Bld: 92 mg/dL (ref 70–99)
Potassium: 3.7 mmol/L (ref 3.5–5.1)
Sodium: 136 mmol/L (ref 135–145)

## 2022-08-15 MED ORDER — ATORVASTATIN CALCIUM 80 MG PO TABS: 80.0000 mg | ORAL_TABLET | Freq: Every day | ORAL | 11 refills | Status: AC

## 2022-08-15 MED ORDER — LISINOPRIL 2.5 MG PO TABS: 2.5000 mg | ORAL_TABLET | Freq: Every day | ORAL | 2 refills | Status: AC

## 2022-08-15 MED ORDER — METOPROLOL TARTRATE 25 MG PO TABS: 12.5000 mg | ORAL_TABLET | Freq: Two times a day (BID) | ORAL | 5 refills | Status: AC

## 2022-08-15 MED ORDER — TICAGRELOR 90 MG PO TABS: 90.0000 mg | ORAL_TABLET | Freq: Two times a day (BID) | ORAL | 3 refills | Status: AC

## 2022-08-15 MED ORDER — NITROGLYCERIN 0.4 MG SL SUBL: 0.4000 mg | SUBLINGUAL_TABLET | SUBLINGUAL | 0 refills | Status: AC | PRN

## 2022-08-15 MED ORDER — ASPIRIN 81 MG PO CHEW: 81.0000 mg | CHEWABLE_TABLET | Freq: Every day | ORAL | 3 refills | Status: AC

## 2022-08-15 NOTE — Consult Note (Signed)
CARDIOLOGY CONSULT NOTE               Patient ID: MAKENLI WOLVERTON MRN: [id number] DOB/AGE: [birth year] 67 y.o.  Admit date: 08/13/2022 Referring Physician Dr Georgana Curio Hospitalist Primary Physician Dr. Howell Rucks Soles primary Primary Cardiologist Nehemiah Massed Reason for Consultation STEMI inferior posterior  HPI: Patient is a 67 year old female presented with acute onset of chest discomfort.  Found by EMS with ST elevation inferiorly posteriorly after about 30 minutes of chest discomfort she had diaphoresis no nausea vomiting but felt weak chest pain was midsternal radiating to her neck and was brought in via EMS ST elevation inferiorly reciprocal changes septally and laterally.  Patient has been evaluated emergency room hemodynamically stable blood pressure around 130/70 patient was given heparin aspirin Brilinta in the emergency room and cardiology was called for acute cardiac cath and possible intervention because of the STEMI.  Patient states she did not evaluated by cardiology within the past year including functional study and echocardiogram which were both reasonably unremarkable she denies any cardiac history and exercises regularly because she walks dogs for 11 show she is fairly active.  Denies any smoking  Review of systems complete and found to be negative unless listed above review of system was negative except for chest pain some shortness of breath    Past Medical History:  Diagnosis Date   Hypertension    Sinus infection     Past Surgical History:  Procedure Laterality Date   CORONARY/GRAFT ACUTE MI REVASCULARIZATION N/A 08/13/2022   Procedure: Coronary/Graft Acute MI Revascularization;  Surgeon: Yolonda Kida, MD;  Location: [facility];  Service: Cardiovascular;  Laterality: N/A;   ECTOPIC PREGNANCY SURGERY     LEFT HEART CATH AND CORONARY ANGIOGRAPHY N/A 08/13/2022   Procedure: LEFT HEART CATH AND CORONARY ANGIOGRAPHY;  Surgeon: Yolonda Kida, MD;   Location: [facility];  Service: Cardiovascular;  Laterality: N/A;    Medications Prior to Admission  Medication Sig Dispense Refill Last Dose   levothyroxine (SYNTHROID) 25 MCG tablet Take 25 mcg by mouth daily before breakfast.   08/12/2022   naproxen (NAPROSYN) 500 MG tablet Take 500 mg by mouth 2 (two) times daily with a meal.   08/12/2022   Social History   Socioeconomic History   Marital status: Divorced    Spouse name: Not on file   Number of children: Not on file   Years of education: Not on file   Highest education level: Not on file  Occupational History   Not on file  Tobacco Use   Smoking status: Never   Smokeless tobacco: Never  Vaping Use   Vaping Use: Never used  Substance and Sexual Activity   Alcohol use: No   Drug use: No   Sexual activity: Not on file  Other Topics Concern   Not on file  Social History Narrative   Not on file   Social Determinants of Health   Financial Resource Strain: Not on file  Food Insecurity: No Food Insecurity (08/13/2022)   Hunger Vital Sign    Worried About Running Out of Food in the Last Year: Never true    Ran Out of Food in the Last Year: Never true  Transportation Needs: No Transportation Needs (08/13/2022)   PRAPARE - Hydrologist (Medical): No    Lack of Transportation (Non-Medical): No  Physical Activity: Not on file  Stress: Not on file  Social Connections: Not on file  Intimate Partner Violence: Not At Risk (08/13/2022)   Humiliation, Afraid, Rape, and Kick questionnaire    Fear of Current or Ex-Partner: No    Emotionally Abused: No    Physically Abused: No    Sexually Abused: No    History reviewed. No pertinent family history.    Review of systems complete and found to be negative unless listed above      PHYSICAL EXAM  General: Well developed, well nourished, in no acute distress HEENT:  Normocephalic and atramatic Neck:  No JVD.  Lungs: Clear bilaterally to  auscultation and percussion. Heart: HRRR . Normal S1 and S2 without gallops or murmurs.  Abdomen: Bowel sounds are positive, abdomen soft and non-tender  Msk:  Back normal, normal gait. Normal strength and tone for age. Extremities: No clubbing, cyanosis or edema.   Neuro: Alert and oriented X 3. Psych:  Good affect, responds appropriately  Labs:   Lab Results  Component Value Date   WBC 6.3 08/15/2022   HGB 12.4 08/15/2022   HCT 36.8 08/15/2022   MCV 87.6 08/15/2022   PLT 227 08/15/2022    Recent Labs  Lab 08/13/22 0017 08/13/22 0338 08/15/22 0652  NA 136   < > 136  K 3.0*   < > 3.7  CL 103   < > 108  CO2 21*   < > 24  BUN 22   < > 13  CREATININE 1.54*   < > 0.78  CALCIUM 9.2   < > 8.6*  PROT 7.2  --   --   BILITOT 0.9  --   --   ALKPHOS 49  --   --   ALT 12  --   --   AST 21  --   --   GLUCOSE 155*   < > 92   < > = values in this interval not displayed.   Lab Results  Component Value Date   TROPONINI <0.03 10/11/2018    Lab Results  Component Value Date   CHOL 298 (H) 08/13/2022   Lab Results  Component Value Date   HDL 84 08/13/2022   Lab Results  Component Value Date   LDLCALC 200 (H) 08/13/2022   Lab Results  Component Value Date   TRIG 70 08/13/2022   Lab Results  Component Value Date   CHOLHDL 3.5 08/13/2022   No results found for: "LDLDIRECT"    Radiology: CARDIAC CATHETERIZATION  Result Date: 08/14/2022   Dist RCA lesion is 95% stenosed.   Mid Cx lesion is 95% stenosed.   Prox LAD lesion is 90% stenosed.   Prox LAD to Mid LAD lesion is 90% stenosed.   A drug-eluting stent was successfully placed using a STENT ONYX FRONTIER 2.25X22.   A drug-eluting stent was successfully placed using a STENT ONYX FRONTIER 2.5X15.   Post intervention, there is a 0% residual stenosis.   Post intervention, there is a 0% residual stenosis.   The left ventricular systolic function is normal.   LV end diastolic pressure is normal.   The left ventricular ejection  fraction is 55-65% by visual estimate. STEMI presentation patient brought the emergency room EKG with ST elevation inferiorly reciprocal depression laterally and anteriorly possible posterior involvement.  Patient was hemodynamically stable with mild hypotension weakness fatigue nausea pale gray electrographic painless 7 out of 10 and had started 30 minutes to 1 hour ago..  In emergency room the patient was given Brilinta 180 aspirin total of 325 heparin bolus of 4000  then brought to the cardiac Cath Lab for emergent cardiac cath. Right radial approach 6 French sheath was placed in the right radial artery under ultrasound guidance after 5 cc of 1% lidocaine was used anesthetize area she received 2.5 mg verapamil through the sheath to help with spasm.  Patient received heparin peripherally for anticoagulation.  We first floated a JR4 5 [place] diagnostic catheter into the left ventricle to obtain left ventricular pressures.  We then injected 10 cc into the inflow for left ventriculogram which showed preserved left ventricular function around 60% with borderline inferior hypo- .  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 mild to moderate disease proximal and mid.  The vessel had TIMI-3 flow with thought to probably be the IRA.  We then switched out for 3.4 curved left Judkins catheter 5 [place] to access the left main.  Selective injection to left main showed large left main relatively free of disease LAD was large with proximal large ectatic areas with 90% tandem stenosis proximal to mid between the ectatic segment patient had mild diffuse disease distally circumflex was large with a mid to distal 95% lesion thought to possibly be the IRA with TIMI-3 flow hazy appearing After reviewing images we elected to proceed with intervention of both the RCA  and the circumflex as it was not clear which was the infarct-related artery of both.  For switched out for 6 French 3.5 XB LAD to access the left main.  We chose a 2.5 x 15 mm trek balloon.  After traversing the lesion we predilated the lesion with balloon and then deployed a 2.5 x 15 mm frontier Onyx to 13 atm .we then try to postdilated with a Pierron 2.5 balloon but were unable to cross into the stent balloon appeared to Stent Struts at the Angle of the Vessel.  .  We elected not to pursue further modification of the stent but instead turned our attention to the right coronary artery lesion .we switched out for JR4 6 [place] guide BMW wire and a point 2 .25 58m balloon trek.  Once we engage the guide and right coronary artery and traverse the lesion with a BMW wire we then predilated the lesion with a 2.25 x 15 trek.  We then deployed a 2.2 522 mm frontier Onyx to 13 atm reducing the lesion from 95 down to 0 TIMI-3 flow was maintained throughout.  Catheters and sheath were removed and a TR band applied patient was transferred to ICU for recovery patient is to be maintained on aspirin and Brilinta for 12 months cangrelor was started because the patient had an episode of emesis during the case and were concerned that she had not fully digested the initial dose of Brilinta from the emergency room.  Patient stated her pain was much improved by the time she left the Cath Lab   ECHOCARDIOGRAM COMPLETE  Result Date: 08/13/2022    ECHOCARDIOGRAM REPORT   Patient Name:   CMARGIT DOETSCHDate of Exam: 08/13/2022 Medical Rec #:  [id number]      Height:       63.0 in Accession #:    [id number]     Weight:       123.1 lb Date of Birth:  407-Feb-1957      BSA:  1.573 m Patient Age:    29 years        BP:           129/68 mmHg Patient Gender: F               HR:           81 bpm. Exam Location:  [facility] Procedure: 2D Echo, Color Doppler and Cardiac Doppler Indications:     Acute Myocardial Infarction I21.9  History:          Patient has no prior history of Echocardiogram examinations.                  Risk Factors:Hypertension.  Sonographer:     L. Thornton-Maynard Referring Phys:  [id number] Tregan Read D Shanita Kanan Diagnosing Phys: Yolonda Kida MD IMPRESSIONS  1. Mild inferior lateral hypo.  2. Left ventricular ejection fraction, by estimation, is 65 to 70%. The left ventricle has normal function. The left ventricle has no regional wall motion abnormalities. Left ventricular diastolic parameters are consistent with Grade I diastolic dysfunction (impaired relaxation).  3. Right ventricular systolic function is normal. The right ventricular size is normal.  4. The mitral valve is normal in structure. Trivial mitral valve regurgitation.  5. The aortic valve is normal in structure. Aortic valve regurgitation is mild. FINDINGS  Left Ventricle: Left ventricular ejection fraction, by estimation, is 65 to 70%. The left ventricle has normal function. The left ventricle has no regional wall motion abnormalities. The left ventricular internal cavity size was normal in size. There is  no left ventricular hypertrophy. Left ventricular diastolic parameters are consistent with Grade I diastolic dysfunction (impaired relaxation). Right Ventricle: The right ventricular size is normal. No increase in right ventricular wall thickness. Right ventricular systolic function is normal. Left Atrium: Left atrial size was normal in size. Right Atrium: Right atrial size was normal in size. Pericardium: There is no evidence of pericardial effusion. Mitral Valve: The mitral valve is normal in structure. Trivial mitral valve regurgitation. Tricuspid Valve: The tricuspid valve is normal in structure. Tricuspid valve regurgitation is trivial. Aortic Valve: The aortic valve is normal in structure. Aortic valve regurgitation is mild. Aortic regurgitation PHT measures 413 msec. Aortic valve mean gradient measures 4.0 mmHg. Aortic valve peak gradient measures 7.3 mmHg. Aortic  valve area, by VTI measures 2.62 cm. Pulmonic Valve: The pulmonic valve was normal in structure. Pulmonic valve regurgitation is not visualized. Aorta: The ascending aorta was not well visualized. IAS/Shunts: No atrial level shunt detected by color flow Doppler. Additional Comments: Mild inferior lateral hypo.  LEFT VENTRICLE PLAX 2D LVIDd:         3.70 cm     Diastology LVIDs:         2.40 cm     LV e' medial:    6.85 cm/s LV PW:         0.90 cm     LV E/e' medial:  11.2 LV IVS:        0.90 cm     LV e' lateral:   11.30 cm/s LVOT diam:     1.80 cm     LV E/e' lateral: 6.8 LV SV:         68 LV SV Index:   44 LVOT Area:     2.54 cm  LV Volumes (MOD) LV vol d, MOD A2C: 48.9 ml LV vol d, MOD A4C: 47.1 ml LV vol s, MOD A2C: 16.3 ml LV vol s, MOD A4C:  13.9 ml LV SV MOD A2C:     32.6 ml LV SV MOD A4C:     47.1 ml LV SV MOD BP:      33.3 ml RIGHT VENTRICLE RV Basal diam:  2.30 cm RV S prime:     18.60 cm/s TAPSE (M-mode): 2.4 cm LEFT ATRIUM             Index        RIGHT ATRIUM          Index LA diam:        2.60 cm 1.65 cm/m   RA Area:     9.15 cm LA Vol (A2C):   29.6 ml 18.81 ml/m  RA Volume:   15.50 ml 9.85 ml/m LA Vol (A4C):   26.9 ml 17.10 ml/m LA Biplane Vol: 28.1 ml 17.86 ml/m  AORTIC VALVE                    PULMONIC VALVE AV Area (Vmax):    2.15 cm     PV Vmax:          0.91 m/s AV Area (Vmean):   2.47 cm     PV Peak grad:     3.3 mmHg AV Area (VTI):     2.62 cm     PR End Diast Vel: 2.29 msec AV Vmax:           135.00 cm/s AV Vmean:          90.500 cm/s AV VTI:            0.261 m AV Peak Grad:      7.3 mmHg AV Mean Grad:      4.0 mmHg LVOT Vmax:         114.00 cm/s LVOT Vmean:        88.000 cm/s LVOT VTI:          0.269 m LVOT/AV VTI ratio: 1.03 AI PHT:            413 msec  AORTA Ao Root diam: 3.10 cm Ao Asc diam:  3.50 cm MITRAL VALVE MV Area (PHT): 5.54 cm     SHUNTS MV Decel Time: 137 msec     Systemic VTI:  0.27 m MV E velocity: 77.00 cm/s   Systemic Diam: 1.80 cm MV A velocity: 113.00 cm/s MV E/A  ratio:  0.68 Jaimy Kliethermes D Blanton Kardell MD Electronically signed by Yolonda Kida MD Signature Date/Time: 08/13/2022/1:15:35 PM    Final    MR BRAIN WO CONTRAST  Result Date: 07/30/2022 CLINICAL DATA:  Altered mental status and dizziness EXAM: MRI HEAD WITHOUT CONTRAST TECHNIQUE: Multiplanar, multiecho pulse sequences of the brain and surrounding structures were obtained without intravenous contrast. COMPARISON:  11/09/2020. FINDINGS: Brain: No acute infarct, mass effect or extra-axial collection. No chronic microhemorrhage or siderosis. There is multifocal hyperintense T2-weighted signal within the white matter. Parenchymal volume and CSF spaces are normal. The midline structures are normal. Vascular: Normal flow voids. Skull and upper cervical spine: Normal marrow signal. Sinuses/Orbits: Negative. Other: None. IMPRESSION: 1. No acute intracranial abnormality. 2. Multifocal hyperintense T2-weighted signal within the white matter, most consistent with chronic microvascular ischemia. Electronically Signed   By: Ulyses Jarred M.D.   On: 07/30/2022 21:05   CT HEAD WO CONTRAST (5MM)  Result Date: 07/30/2022 CLINICAL DATA:  Headache, new onset (Age >= 73y) EXAM: CT HEAD WITHOUT CONTRAST TECHNIQUE: Contiguous axial images were obtained from the base of the skull through the  vertex without intravenous contrast. RADIATION DOSE REDUCTION: This exam was performed according to the departmental dose-optimization program which includes automated exposure control, adjustment of the mA and/or kV according to patient size and/or use of iterative reconstruction technique. COMPARISON:  01/12/2021 FINDINGS: Brain: No intracranial hemorrhage, mass effect, or midline shift. No hydrocephalus. The basilar cisterns are patent. Mild chronic small vessel ischemic change which is similar to prior exam. No evidence of territorial infarct or acute ischemia. No extra-axial or intracranial fluid collection. Vascular: No hyperdense vessel or  unexpected calcification. Skull: Normal. Negative for fracture or focal lesion. Sinuses/Orbits: Paranasal sinuses and mastoid air cells are clear. The visualized orbits are unremarkable. Other: None. IMPRESSION: 1. No acute intracranial abnormality. 2. Mild chronic small vessel ischemic change. Electronically Signed   By: Keith Rake M.D.   On: 07/30/2022 18:51    EKG: Normal sinus rhythm ST elevation inferiorly reciprocal depression laterally and septally rate of about 75  ASSESSMENT AND PLAN:  STEMI inferiorly posteriorly Renal insufficiency Hypokalemia History of hypertension Hyperlipidemia                           Plan STEMI presentation status post PCI and stent x 2 to circumflex and RCA post IR with with DES times Continue aspirin and Brilinta for at least 12 months Statin therapy Lipitor for hyperlipidemia post MI Recommend ACE ARB beta-blocker statin aspirin Brilinta Right wrist swelling and ecchymosis improving swelling much better good function good circulation continue pressure dressing and elevation Renal insufficiency continue adequate hydration consider involving nephrology Echocardiogram for further assessment left ventricular function wall motion post MI 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  LAD is deferred and may be staged for a later date Hopefully increase activity by ambulating in the halls and transfer to telemetry in progressive care shortly Continue to correct electrolytes with hypokalemia Refer the patient to cardiac rehab Anticipate discharge 2 to 3 days     Signed: Yolonda Kida MD, 08/15/2022, 9:54 AM

## 2022-08-15 NOTE — Discharge Summary (Signed)
[facility] Discharge Summary   Patient: Stacey Francis [id number]  PCP: Herminio Commons, MD  Date of admission: 08/13/2022   Date of discharge:  08/15/2022     Discharge Diagnoses:  Principal Problem:   STEMI (ST elevation myocardial infarction) ([place]) Active Problems:   AKI (acute kidney injury) ([place])   Hypokalemia   Essential hypertension   Dyslipidemia   Admitted From: Home Disposition:  Home   Recommendations for Outpatient Follow-up:  PCP: Follow-up with PCP in 1 week Follow-up with cardiology in 1 week Follow up LABS/TEST:  none   Follow-up Information     Callwood, Karma Greaser D, MD. Go in 3 day(s).   Specialties: Cardiology, Internal Medicine Why: Appointment with Dr. Clayborn Bigness on Friday, 3/15 at 12pm. Contact information: [place] [address] [phone]                Diet recommendation: Cardiac diet  Activity: The patient is advised to gradually reintroduce usual activities, as tolerated  Discharge Condition: stable  Code Status: Full code   History of present illness: As per the H and P dictated on admission Hospital Course:  Stacey Francis is a 67 y.o. female with PMH of HTN, presented to the emergency room with acute onset of chest pain that started at 11:30 PM that felt like pressure sensation over her entire chest and radiating to her neck.  She had associated diaphoresis without nausea or vomiting.    EKG suspicious for STEMI with inferior ST segment elevation and reciprocal T wave inversion in V1 through V3 per EMS. By the time the patient came to the ER EKG was much better.  Dr. Clayborn Bigness was called for code STEMI. The patient was taken to the Cath Lab and underwent PCI and 2 stents placement in the RCA and LCA. She is admitted to an ICU bed for further evaluation and management.  Assessment and Plan: # STEMI (ST elevation myocardial infarction)  S/p PCI, RCA and LCx stents placement. Continue aspirin 81 mg  p.o. daily and Brilinta 90 mg p.o. twice daily. Continue Lopressor 12.5 mg p.o. twice daily, lisinopril 2.5 mg p.o. daily, Lipitor  80 mg p.o. daily.  LDL 200 elevated, repeat lipid profile after 3 months. Continue as needed nitroglycerin.  For right hand/wrist swelling developed after cath, improving, keep wrist elevated.  Patient was cleared by cardiology to discharge home and follow-up as an outpatient.  Made asymptomatic, agreed with the discharge planning. # AKI (acute kidney injury), Resolved s/p IVF Cr 0.78 today # Hypokalemia, Potassium was replaced.  Resolved # Essential hypertension. Continue Lopressor and lisinopril.  Monitor BP at home and follow with PCP and cardiology to titrate medications accordingly. # Dyslipidemia: Continue Lipitor 80 mg p.o. daily. LDL 200, repeat lipid profile after 3 months  Body mass index is 21.81 kg/m.  Nutrition Interventions:   Patient was ambulatory without any assistance. On the day of the discharge the patient's vitals were stable, and no other acute medical condition were reported by patient. the patient was felt safe to be discharge at Home.  Consultants: Cardiology Procedures: Cardiac cath s/p PCI, RCA and LCx stents placement.   Discharge Exam: General: Appear in no distress, no Rash; Oral Mucosa Clear, moist. Cardiovascular: S1 and S2 Present, no Murmur, Respiratory: normal respiratory effort, Bilateral Air entry present and no Crackles, no wheezes Abdomen: Bowel Sound present, Soft and no tenderness, no hernia Extremities: no Pedal edema, no calf tenderness Neurology: alert and oriented to time,  place, and person affect appropriate.  Filed Weights   08/13/22 0012  Weight: 55.8 kg   Vitals:   08/15/22 0415 08/15/22 0807  BP: (!) 147/74 135/70  Pulse: 77 77  Resp: 18 16  Temp: 97.6 F (36.4 C) 98.8 F (37.1 C)  SpO2: 100% 100%    DISCHARGE MEDICATION: Allergies as of 08/15/2022       Reactions   Penicillins          Medication List     STOP taking these medications    naproxen 500 MG tablet Commonly known as: NAPROSYN       TAKE these medications    aspirin 81 MG chewable tablet Chew 1 tablet (81 mg total) by mouth daily. Start taking on: August 16, 2022   atorvastatin 80 MG tablet Commonly known as: LIPITOR Take 1 tablet (80 mg total) by mouth daily. Start taking on: August 16, 2022   levothyroxine 25 MCG tablet Commonly known as: SYNTHROID Take 25 mcg by mouth daily before breakfast.   lisinopril 2.5 MG tablet Commonly known as: ZESTRIL Take 1 tablet (2.5 mg total) by mouth daily. Start taking on: August 16, 2022   metoprolol tartrate 25 MG tablet Commonly known as: LOPRESSOR Take 0.5 tablets (12.5 mg total) by mouth 2 (two) times daily.   nitroGLYCERIN 0.4 MG SL tablet Commonly known as: NITROSTAT Place 1 tablet (0.4 mg total) under the tongue every 5 (five) minutes as needed for chest pain.   ticagrelor 90 MG Tabs tablet Commonly known as: BRILINTA Take 1 tablet (90 mg total) by mouth 2 (two) times daily.       Allergies  Allergen Reactions   Penicillins    Discharge Instructions     AMB Referral to Cardiac Rehabilitation - Phase II   Complete by: As directed    Diagnosis: STEMI   After initial evaluation and assessments completed: Virtual Based Care may be provided alone or in conjunction with Phase 2 Cardiac Rehab based on patient barriers.: Yes   Call MD for:   Complete by: As directed    Chest pain or palpitations   Call MD for:  difficulty breathing, headache or visual disturbances   Complete by: As directed    Call MD for:  extreme fatigue   Complete by: As directed    Call MD for:  persistant dizziness or light-headedness   Complete by: As directed    Call MD for:  severe uncontrolled pain   Complete by: As directed    Diet - low sodium heart healthy   Complete by: As directed    Discharge instructions   Complete by: As directed    Follow-up with  PCP in 1 week Follow-up with cardiology in 1 week   Increase activity slowly   Complete by: As directed        The results of significant diagnostics from this hospitalization (including imaging, microbiology, ancillary and laboratory) are listed below for reference.    Significant Diagnostic Studies: CARDIAC CATHETERIZATION  Result Date: 08/14/2022   Dist RCA lesion is 95% stenosed.   Mid Cx lesion is 95% stenosed.   Prox LAD lesion is 90% stenosed.   Prox LAD to Mid LAD lesion is 90% stenosed.   A drug-eluting stent was successfully placed using a STENT ONYX FRONTIER 2.25X22.   A drug-eluting stent was successfully placed using a STENT ONYX FRONTIER 2.5X15.   Post intervention, there is a 0% residual stenosis.   Post intervention, there is a  0% residual stenosis.   The left ventricular systolic function is normal.   LV end diastolic pressure is normal.   The left ventricular ejection fraction is 55-65% by visual estimate. STEMI presentation patient brought the emergency room EKG with ST elevation inferiorly reciprocal depression laterally and anteriorly possible posterior involvement.  Patient was hemodynamically stable with mild hypotension weakness fatigue nausea pale gray electrographic painless 7 out of 10 and had started 30 minutes to 1 hour ago..  In emergency room the patient was given Brilinta 180 aspirin total of 325 heparin bolus of 4000 then brought to the cardiac Cath Lab for emergent cardiac cath. Right radial approach 6 French sheath was placed in the right radial artery under ultrasound guidance after 5 cc of 1% lidocaine was used anesthetize area she received 2.5 mg verapamil through the sheath to help with spasm.  Patient received heparin peripherally for anticoagulation.  We first floated a JR4 5 [place] diagnostic catheter into the left ventricle to obtain left ventricular pressures.  We then injected 10 cc into the inflow for left ventriculogram which showed preserved left  ventricular function around 60% with borderline inferior hypo- .  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 mild to moderate disease proximal and mid.  The vessel had TIMI-3 flow with thought to probably be the IRA.  We then switched out for 3.4 curved left Judkins catheter 5 [place] to access the left main.  Selective injection to left main showed large left main relatively free of disease LAD was large with proximal large ectatic areas with 90% tandem stenosis proximal to mid between the ectatic segment patient had mild diffuse disease distally circumflex was large with a mid to distal 95% lesion thought to possibly be the IRA with TIMI-3 flow hazy appearing After reviewing images we elected to proceed with intervention of both the RCA and the circumflex as it was not clear which was the infarct-related artery of both.  For switched out for 6 French 3.5 XB LAD to access the left main.  We chose a 2.5 x 15 mm trek balloon.  After traversing the lesion we predilated the lesion with balloon and then deployed a 2.5 x 15 mm frontier Onyx to 13 atm .we then try to postdilated with a [place] 2.5 balloon but were unable to cross into the stent balloon appeared to Stent Struts at the Angle of the Vessel.  .  We elected not to pursue further modification of the stent but instead turned our attention to the right coronary artery lesion .we switched out for JR4 6 [place] guide BMW wire and a point 2 .25 110m balloon trek.  Once we engage the guide and right coronary artery and traverse the lesion with a BMW wire we then predilated the lesion with a 2.25 x 15 trek.  We then deployed a 2.2 522 mm frontier Onyx to 13 atm reducing the lesion from 95 down to 0 TIMI-3 flow was maintained throughout.  Catheters and sheath were removed and a TR band applied patient was  transferred to ICU for recovery patient is to be maintained on aspirin and Brilinta for 12 months cangrelor was started because the patient had an episode of emesis during the case and were concerned that she had not fully digested the initial dose of Brilinta from the  emergency room.  Patient stated her pain was much improved by the time she left the Cath Lab   ECHOCARDIOGRAM COMPLETE  Result Date: 08/13/2022    ECHOCARDIOGRAM REPORT   Patient Name:   Stacey Francis Date of Exam: 08/13/2022 Medical Rec #:  [id number]       Height:       63.0 in Accession #:    [id number]      Weight:       123.1 lb Date of Birth:  [birth year]       BSA:          1.573 m Patient Age:    50 years        BP:           129/68 mmHg Patient Gender: F               HR:           81 bpm. Exam Location:  [facility] Procedure: 2D Echo, Color Doppler and Cardiac Doppler Indications:     Acute Myocardial Infarction I21.9  History:         Patient has no prior history of Echocardiogram examinations.                  Risk Factors:Hypertension.  Sonographer:     L. Thornton-Maynard Referring Phys:  [id number] DWAYNE D CALLWOOD Diagnosing Phys: Yolonda Kida MD IMPRESSIONS  1. Mild inferior lateral hypo.  2. Left ventricular ejection fraction, by estimation, is 65 to 70%. The left ventricle has normal function. The left ventricle has no regional wall motion abnormalities. Left ventricular diastolic parameters are consistent with Grade I diastolic dysfunction (impaired relaxation).  3. Right ventricular systolic function is normal. The right ventricular size is normal.  4. The mitral valve is normal in structure. Trivial mitral valve regurgitation.  5. The aortic valve is normal in structure. Aortic valve regurgitation is mild. FINDINGS  Left Ventricle: Left ventricular ejection fraction, by estimation, is 65 to 70%. The left ventricle has normal function. The left ventricle has no regional wall motion abnormalities. The left ventricular internal  cavity size was normal in size. There is  no left ventricular hypertrophy. Left ventricular diastolic parameters are consistent with Grade I diastolic dysfunction (impaired relaxation). Right Ventricle: The right ventricular size is normal. No increase in right ventricular wall thickness. Right ventricular systolic function is normal. Left Atrium: Left atrial size was normal in size. Right Atrium: Right atrial size was normal in size. Pericardium: There is no evidence of pericardial effusion. Mitral Valve: The mitral valve is normal in structure. Trivial mitral valve regurgitation. Tricuspid Valve: The tricuspid valve is normal in structure. Tricuspid valve regurgitation is trivial. Aortic Valve: The aortic valve is normal in structure. Aortic valve regurgitation is mild. Aortic regurgitation PHT measures 413 msec. Aortic valve mean gradient measures 4.0 mmHg. Aortic valve peak gradient measures 7.3 mmHg. Aortic valve area, by VTI measures 2.62 cm. Pulmonic Valve: The pulmonic valve was normal in structure. Pulmonic valve regurgitation is not visualized. Aorta: The ascending aorta was not well visualized. IAS/Shunts: No atrial level shunt detected by color flow Doppler. Additional Comments: Mild inferior lateral hypo.  LEFT VENTRICLE PLAX 2D LVIDd:         3.70 cm     Diastology LVIDs:         2.40 cm     LV e' medial:    6.85 cm/s LV PW:  0.90 cm     LV E/e' medial:  11.2 LV IVS:        0.90 cm     LV e' lateral:   11.30 cm/s LVOT diam:     1.80 cm     LV E/e' lateral: 6.8 LV SV:         68 LV SV Index:   44 LVOT Area:     2.54 cm  LV Volumes (MOD) LV vol d, MOD A2C: 48.9 ml LV vol d, MOD A4C: 47.1 ml LV vol s, MOD A2C: 16.3 ml LV vol s, MOD A4C: 13.9 ml LV SV MOD A2C:     32.6 ml LV SV MOD A4C:     47.1 ml LV SV MOD BP:      33.3 ml RIGHT VENTRICLE RV Basal diam:  2.30 cm RV S prime:     18.60 cm/s TAPSE (M-mode): 2.4 cm LEFT ATRIUM             Index        RIGHT ATRIUM          Index LA diam:        2.60  cm 1.65 cm/m   RA Area:     9.15 cm LA Vol (A2C):   29.6 ml 18.81 ml/m  RA Volume:   15.50 ml 9.85 ml/m LA Vol (A4C):   26.9 ml 17.10 ml/m LA Biplane Vol: 28.1 ml 17.86 ml/m  AORTIC VALVE                    PULMONIC VALVE AV Area (Vmax):    2.15 cm     PV Vmax:          0.91 m/s AV Area (Vmean):   2.47 cm     PV Peak grad:     3.3 mmHg AV Area (VTI):     2.62 cm     PR End Diast Vel: 2.29 msec AV Vmax:           135.00 cm/s AV Vmean:          90.500 cm/s AV VTI:            0.261 m AV Peak Grad:      7.3 mmHg AV Mean Grad:      4.0 mmHg LVOT Vmax:         114.00 cm/s LVOT Vmean:        88.000 cm/s LVOT VTI:          0.269 m LVOT/AV VTI ratio: 1.03 AI PHT:            413 msec  AORTA Ao Root diam: 3.10 cm Ao Asc diam:  3.50 cm MITRAL VALVE MV Area (PHT): 5.54 cm     SHUNTS MV Decel Time: 137 msec     Systemic VTI:  0.27 m MV E velocity: 77.00 cm/s   Systemic Diam: 1.80 cm MV A velocity: 113.00 cm/s MV E/A ratio:  0.68 Dwayne D Callwood MD Electronically signed by Yolonda Kida MD Signature Date/Time: 08/13/2022/1:15:35 PM    Final    MR BRAIN WO CONTRAST  Result Date: 07/30/2022 CLINICAL DATA:  Altered mental status and dizziness EXAM: MRI HEAD WITHOUT CONTRAST TECHNIQUE: Multiplanar, multiecho pulse sequences of the brain and surrounding structures were obtained without intravenous contrast. COMPARISON:  11/09/2020. FINDINGS: Brain: No acute infarct, mass effect or extra-axial collection. No chronic microhemorrhage or siderosis. There is multifocal hyperintense T2-weighted signal within the  white matter. Parenchymal volume and CSF spaces are normal. The midline structures are normal. Vascular: Normal flow voids. Skull and upper cervical spine: Normal marrow signal. Sinuses/Orbits: Negative. Other: None. IMPRESSION: 1. No acute intracranial abnormality. 2. Multifocal hyperintense T2-weighted signal within the white matter, most consistent with chronic microvascular ischemia. Electronically Signed   By:  Ulyses Jarred M.D.   On: 07/30/2022 21:05   CT HEAD WO CONTRAST (5MM)  Result Date: 07/30/2022 CLINICAL DATA:  Headache, new onset (Age >= 61y) EXAM: CT HEAD WITHOUT CONTRAST TECHNIQUE: Contiguous axial images were obtained from the base of the skull through the vertex without intravenous contrast. RADIATION DOSE REDUCTION: This exam was performed according to the departmental dose-optimization program which includes automated exposure control, adjustment of the mA and/or kV according to patient size and/or use of iterative reconstruction technique. COMPARISON:  01/12/2021 FINDINGS: Brain: No intracranial hemorrhage, mass effect, or midline shift. No hydrocephalus. The basilar cisterns are patent. Mild chronic small vessel ischemic change which is similar to prior exam. No evidence of territorial infarct or acute ischemia. No extra-axial or intracranial fluid collection. Vascular: No hyperdense vessel or unexpected calcification. Skull: Normal. Negative for fracture or focal lesion. Sinuses/Orbits: Paranasal sinuses and mastoid air cells are clear. The visualized orbits are unremarkable. Other: None. IMPRESSION: 1. No acute intracranial abnormality. 2. Mild chronic small vessel ischemic change. Electronically Signed   By: Keith Rake M.D.   On: 07/30/2022 18:51    Microbiology: Recent Results (from the past 240 hour(s))  Resp panel by RT-PCR (RSV, Flu A&B, Covid) Anterior Nasal Swab     Status: None   Collection Time: 08/13/22 12:14 AM   Specimen: Anterior Nasal Swab  Result Value Ref Range Status   SARS Coronavirus 2 by RT PCR NEGATIVE NEGATIVE Final    Comment: (NOTE) SARS-CoV-2 target nucleic acids are NOT DETECTED.  The SARS-CoV-2 RNA is generally detectable in upper respiratory specimens during the acute phase of infection. The lowest concentration of SARS-CoV-2 viral copies this assay can detect is 138 copies/mL. A negative result does not preclude SARS-Cov-2 infection and should not  be used as the sole basis for treatment or other patient management decisions. A negative result may occur with  improper specimen collection/handling, submission of specimen other than nasopharyngeal swab, presence of viral mutation(s) within the areas targeted by this assay, and inadequate number of viral copies(<138 copies/mL). A negative result must be combined with clinical observations, patient history, and epidemiological information. The expected result is Negative.  Fact Sheet for Patients:  EntrepreneurPulse.com.au  Fact Sheet for Healthcare Providers:  IncredibleEmployment.be  This test is no t yet approved or cleared by the [place] FDA and  has been authorized for detection and/or diagnosis of SARS-CoV-2 by FDA under an Emergency Use Authorization (EUA). This EUA will remain  in effect (meaning this test can be used) for the duration of the COVID-19 declaration under Section 564(b)(1) of the Act, 21 U.S.C.section 360bbb-3(b)(1), unless the authorization is terminated  or revoked sooner.       Influenza A by PCR NEGATIVE NEGATIVE Final   Influenza B by PCR NEGATIVE NEGATIVE Final    Comment: (NOTE) The Xpert Xpress SARS-CoV-2/FLU/RSV plus assay is intended as an aid in the diagnosis of influenza from Nasopharyngeal swab specimens and should not be used as a sole basis for treatment. Nasal washings and aspirates are unacceptable for Xpert Xpress SARS-CoV-2/FLU/RSV testing.  Fact Sheet for Patients: EntrepreneurPulse.com.au  Fact Sheet for Healthcare Providers: IncredibleEmployment.be  This test  is not yet approved or cleared by the [place] and has been authorized for detection and/or diagnosis of SARS-CoV-2 by FDA under an Emergency Use Authorization (EUA). This EUA will remain in effect (meaning this test can be used) for the duration of the COVID-19 declaration under Section  564(b)(1) of the Act, 21 U.S.C. section 360bbb-3(b)(1), unless the authorization is terminated or revoked.     Resp Syncytial Virus by PCR NEGATIVE NEGATIVE Final    Comment: (NOTE) Fact Sheet for Patients: EntrepreneurPulse.com.au  Fact Sheet for Healthcare Providers: IncredibleEmployment.be  This test is not yet approved or cleared by the [place] FDA and has been authorized for detection and/or diagnosis of SARS-CoV-2 by FDA under an Emergency Use Authorization (EUA). This EUA will remain in effect (meaning this test can be used) for the duration of the COVID-19 declaration under Section 564(b)(1) of the Act, 21 U.S.C. section 360bbb-3(b)(1), unless the authorization is terminated or revoked.  Performed at [facility], [address] [place], [place] [address]   MRSA Next Gen by PCR, Nasal     Status: None   Collection Time: 08/13/22  3:27 AM   Specimen: Nasal Mucosa; Nasal Swab  Result Value Ref Range Status   MRSA by PCR Next Gen NOT DETECTED NOT DETECTED Final    Comment: (NOTE) The GeneXpert MRSA Assay (FDA approved for NASAL specimens only), is one component of a comprehensive MRSA colonization surveillance program. It is not intended to diagnose MRSA infection nor to guide or monitor treatment for MRSA infections. Test performance is not FDA approved in patients less than 82 years old. Performed at [facility], [address] [place], [place] [address]      Labs: CBC: Recent Labs  Lab 08/13/22 0017 08/13/22 0338 08/14/22 0430 08/15/22 0652  WBC 8.9 10.8* 7.0 6.3  NEUTROABS 3.8  --   --   --   HGB 12.6 11.2* 10.6* 12.4  HCT 37.2 34.1* 32.1* 36.8  MCV 86.9 89.7 88.7 87.6  PLT 249 194 195 [id number]   Basic Metabolic Panel: Recent Labs  Lab 08/13/22 0017 08/13/22 0117 08/13/22 0338 08/14/22 0430 08/15/22 0652  NA 136  --  136 136 136  K 3.0*  --  3.8 3.9 3.7  CL 103  --  105 108 108  CO2  21*  --  '23 23 24  '$ GLUCOSE 155*  --  130* 105* 92  BUN 22  --  '21 13 13  '$ CREATININE 1.54*  --  1.29* 0.92 0.78  CALCIUM 9.2  --  8.0* 8.5* 8.6*  MG  --  2.1  --  1.9  --   PHOS  --   --   --  3.6  --    Liver Function Tests: Recent Labs  Lab 08/13/22 0017  AST 21  ALT 12  ALKPHOS 49  BILITOT 0.9  PROT 7.2  ALBUMIN 4.1   No results for input(s): "LIPASE", "AMYLASE" in the last 168 hours. No results for input(s): "AMMONIA" in the last 168 hours. Cardiac Enzymes: No results for input(s): "CKTOTAL", "CKMB", "CKMBINDEX", "TROPONINI" in the last 168 hours. BNP (last 3 results) No results for input(s): "BNP" in the last 8760 hours. CBG: Recent Labs  Lab 08/13/22 0313 08/14/22 1647  GLUCAP 102* 90    Time spent: 35 minutes  Signed:  Val Riles  [facility] 08/15/2022 11:47 AM

## 2022-08-15 NOTE — Progress Notes (Signed)
[place] NOTE       Patient ID: Stacey Francis MRN: [id number] DOB/AGE: May 15, 1956 67 y.o.  Admit date: 08/13/2022 Referring Physician Dr. Marjean Donna Primary Physician [facility] Primary Cardiologist Dr. Nehemiah Massed Reason for Consultation inferior STEMI  HPI: Stacey Francis is a 67yoF with a PMH of HTN, hypothyroidism who presented to [facility] ED the early morning of 3/10 with chest pressure that radiated up to her neck. Initial EKG showed inferior STE and reciprocal changes anterolaterally. Code STEMI was activated in the field by EMS and she was taken emergently to the cath lab overnight by Dr. Clayborn Bigness.  LHC revealed 99% Lcx, 95% distal RCA which were both treated with PCI with DES.  Residual 95% proximal and 75% distal LAD that was felt best to be treated medically initially and staged at a later date.  Post procedurally, the patient developed a hematoma on her right wrist with significant swelling into her hand which improved following deflation of the TR band. Echo with preserved LVEF and grade 1 diastolic dysfunction  Interval history: - seen and examined in private room in PCU - remains chest pressure free, R hand swelling continues to improve - eager to go home  Review of systems complete and found to be negative unless listed above     Past Medical History:  Diagnosis Date   Hypertension    Sinus infection     Past Surgical History:  Procedure Laterality Date   CORONARY/GRAFT ACUTE MI REVASCULARIZATION N/A 08/13/2022   Procedure: Coronary/Graft Acute MI Revascularization;  Surgeon: Yolonda Kida, MD;  Location: [place] CV [facility];  Service: Cardiovascular;  Laterality: N/A;   ECTOPIC PREGNANCY SURGERY     LEFT HEART CATH AND CORONARY ANGIOGRAPHY N/A 08/13/2022   Procedure: LEFT HEART CATH AND CORONARY ANGIOGRAPHY;  Surgeon: Yolonda Kida, MD;  Location: [place] [facility];  Service: Cardiovascular;  Laterality:  N/A;    Medications Prior to Admission  Medication Sig Dispense Refill Last Dose   levothyroxine (SYNTHROID) 25 MCG tablet Take 25 mcg by mouth daily before breakfast.   08/12/2022   naproxen (NAPROSYN) 500 MG tablet Take 500 mg by mouth 2 (two) times daily with a meal.   08/12/2022   Social History   Socioeconomic History   Marital status: Divorced    Spouse name: Not on file   Number of children: Not on file   Years of education: Not on file   Highest education level: Not on file  Occupational History   Not on file  Tobacco Use   Smoking status: Never   Smokeless tobacco: Never  Vaping Use   Vaping Use: Never used  Substance and Sexual Activity   Alcohol use: No   Drug use: No   Sexual activity: Not on file  Other Topics Concern   Not on file  Social History Narrative   Not on file   Social Determinants of Health   Financial Resource Strain: Not on file  Food Insecurity: No Food Insecurity (08/13/2022)   Hunger Vital Sign    Worried About Running Out of Food in the Last Year: Never true    Ran Out of Food in the Last Year: Never true  Transportation Needs: No Transportation Needs (08/13/2022)   PRAPARE - Hydrologist (Medical): No    Lack of Transportation (Non-Medical): No  Physical Activity: Not on file  Stress: Not on file  Social Connections: Not on file  Intimate Partner Violence: Not At Risk (08/13/2022)   Humiliation, Afraid, Rape, and Kick questionnaire    Fear of Current or Ex-Partner: No    Emotionally Abused: No    Physically Abused: No    Sexually Abused: No    History reviewed. No pertinent family history.    Intake/Output Summary (Last 24 hours) at 08/15/2022 0902 Last data filed at 08/14/2022 1430 Gross per 24 hour  Intake 440 ml  Output --  Net 440 ml     Vitals:   08/14/22 1927 08/14/22 2338 08/15/22 0415 08/15/22 0807  BP: (!) 144/73 118/65 (!) 147/74 135/70  Pulse: 89 74 77 77  Resp: '18 20 18 16  '$ Temp: 97.9  F (36.6 C) 97.8 F (36.6 C) 97.6 F (36.4 C) 98.8 F (37.1 C)  TempSrc:    Oral  SpO2: 99% 99% 100% 100%  Weight:      Height:        PHYSICAL EXAM General: Thin middle-aged Caucasian female, well nourished, in no acute distress.  Sitting upright in bed, eating breakfast HEENT:  Normocephalic and atraumatic. Neck:  No JVD.  Lungs: Normal respiratory effort on room air. Clear bilaterally to auscultation. No wheezes, crackles, rhonchi.  Heart: HRRR . Normal S1 and S2 without gallops or murmurs.  Abdomen: Non-distended appearing.  Msk: Normal strength and tone for age. Extremities: Warm and well perfused. No clubbing, cyanosis.  No lower extremity edema.  Right wrist: Gauze and Tegaderm covering arteriotomy which I removed. Arteriotomy healed without active bleeding or oozing. Significant generalized swelling into the right hand and ecchymosis extending proximally with continued overall improvement.  Right radial pulse intact distally. Cap refill brisk, neurovascularly intact  Neuro: Alert and oriented X 3. Psych:  Answers questions appropriately.   Labs: Basic Metabolic Panel: Recent Labs    08/13/22 0117 08/13/22 0338 08/14/22 0430 08/15/22 0652  NA  --    < > 136 136  K  --    < > 3.9 3.7  CL  --    < > 108 108  CO2  --    < > 23 24  GLUCOSE  --    < > 105* 92  BUN  --    < > 13 13  CREATININE  --    < > 0.92 0.78  CALCIUM  --    < > 8.5* 8.6*  MG 2.1  --  1.9  --   PHOS  --   --  3.6  --    < > = values in this interval not displayed.    Liver Function Tests: Recent Labs    08/13/22 0017  AST 21  ALT 12  ALKPHOS 49  BILITOT 0.9  PROT 7.2  ALBUMIN 4.1    No results for input(s): "LIPASE", "AMYLASE" in the last 72 hours. CBC: Recent Labs    08/13/22 0017 08/13/22 0338 08/14/22 0430 08/15/22 0652  WBC 8.9   < > 7.0 6.3  NEUTROABS 3.8  --   --   --   HGB 12.6   < > 10.6* 12.4  HCT 37.2   < > 32.1* 36.8  MCV 86.9   < > 88.7 87.6  PLT 249   < > 195  227   < > = values in this interval not displayed.    Cardiac Enzymes: Recent Labs    08/13/22 0017 08/13/22 0351 08/13/22 0600  TROPONINIHS 3 117* 664*    BNP: No results for input(s): "BNP"  in the last 72 hours. D-Dimer: No results for input(s): "DDIMER" in the last 72 hours. Hemoglobin A1C: Recent Labs    08/14/22 0430  HGBA1C 5.7*   Fasting Lipid Panel: Recent Labs    08/13/22 0017  CHOL 298*  HDL 84  LDLCALC 200*  TRIG 70  CHOLHDL 3.5    Thyroid Function Tests: No results for input(s): "TSH", "T4TOTAL", "T3FREE", "THYROIDAB" in the last 72 hours.  Invalid input(s): "FREET3" Anemia Panel: No results for input(s): "VITAMINB12", "FOLATE", "FERRITIN", "TIBC", "IRON", "RETICCTPCT" in the last 72 hours.   Radiology: CARDIAC CATHETERIZATION  Result Date: 08/14/2022   Dist RCA lesion is 95% stenosed.   Mid Cx lesion is 95% stenosed.   Prox LAD lesion is 90% stenosed.   Prox LAD to Mid LAD lesion is 90% stenosed.   A drug-eluting stent was successfully placed using a STENT ONYX FRONTIER 2.25X22.   A drug-eluting stent was successfully placed using a STENT ONYX FRONTIER 2.5X15.   Post intervention, there is a 0% residual stenosis.   Post intervention, there is a 0% residual stenosis.   The left ventricular systolic function is normal.   LV end diastolic pressure is normal.   The left ventricular ejection fraction is 55-65% by visual estimate. STEMI presentation patient brought the emergency room EKG with ST elevation inferiorly reciprocal depression laterally and anteriorly possible posterior involvement.  Patient was hemodynamically stable with mild hypotension weakness fatigue nausea pale gray electrographic painless 7 out of 10 and had started 30 minutes to 1 hour ago..  In emergency room the patient was given Brilinta 180 aspirin total of 325 heparin bolus of 4000 then brought to the cardiac Cath Lab for emergent cardiac cath. Right radial approach 6 French sheath was placed  in the right radial artery under ultrasound guidance after 5 cc of 1% lidocaine was used anesthetize area she received 2.5 mg verapamil through the sheath to help with spasm.  Patient received heparin peripherally for anticoagulation.  We first floated a JR4 5 [place] diagnostic catheter into the left ventricle to obtain left ventricular pressures.  We then injected 10 cc into the inflow for left ventriculogram which showed preserved left ventricular function around 60% with borderline inferior hypo- .  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 mild to moderate disease proximal and mid.  The vessel had TIMI-3 flow with thought to probably be the IRA.  We then switched out for 3.4 curved left Judkins catheter 5 [place] to access the left main.  Selective injection to left main showed large left main relatively free of disease LAD was large with proximal large ectatic areas with 90% tandem stenosis proximal to mid between the ectatic segment patient had mild diffuse disease distally circumflex was large with a mid to distal 95% lesion thought to possibly be the IRA with TIMI-3 flow hazy appearing After reviewing images we elected to proceed with intervention of both the RCA and the circumflex as it was not clear which was the infarct-related artery of both.  For switched out for 6 French 3.5 XB LAD to access the left main.  We chose a 2.5 x 15 mm trek balloon.  After traversing the lesion we predilated the lesion with balloon and then deployed a 2.5 x 15 mm frontier Onyx to 13 atm .we then try  to postdilated with a [place] 2.5 balloon but were unable to cross into the stent balloon appeared to Stent Struts at the Angle of the Vessel.  .  We elected not to pursue further modification of the stent but instead turned our attention to the right coronary  artery lesion .we switched out for JR4 6 [place] guide BMW wire and a point 2 .25 60m balloon trek.  Once we engage the guide and right coronary artery and traverse the lesion with a BMW wire we then predilated the lesion with a 2.25 x 15 trek.  We then deployed a 2.2 522 mm frontier Onyx to 13 atm reducing the lesion from 95 down to 0 TIMI-3 flow was maintained throughout.  Catheters and sheath were removed and a TR band applied patient was transferred to ICU for recovery patient is to be maintained on aspirin and Brilinta for 12 months cangrelor was started because the patient had an episode of emesis during the case and were concerned that she had not fully digested the initial dose of Brilinta from the emergency room.  Patient stated her pain was much improved by the time she left the Cath Lab   ECHOCARDIOGRAM COMPLETE  Result Date: 08/13/2022    ECHOCARDIOGRAM REPORT   Patient Name:   Stacey HOWMANDate of Exam: 08/13/2022 Medical Rec #:  [id number]      Height:       63.0 in Accession #:    [id number]     Weight:       123.1 lb Date of Birth:  [birth year]      BSA:          1.573 m Patient Age:    89years        BP:           129/68 mmHg Patient Gender: F               HR:           81 bpm. Exam Location:  [facility] Procedure: 2D Echo, Color Doppler and Cardiac Doppler Indications:     Acute Myocardial Infarction I21.9  History:         Patient has no prior history of Echocardiogram examinations.                  Risk Factors:Hypertension.  Sonographer:     L. Thornton-Maynard Referring Phys:  [id number] D CALLWOOD Diagnosing Phys: DYolonda KidaMD IMPRESSIONS  1. Mild inferior lateral hypo.  2. Left ventricular ejection fraction, by estimation, is 65 to 70%. The left ventricle has normal function. The left ventricle has no regional wall motion abnormalities. Left ventricular diastolic parameters are consistent with Grade I diastolic dysfunction (impaired relaxation).  3. Right ventricular systolic  function is normal. The right ventricular size is normal.  4. The mitral valve is normal in structure. Trivial mitral valve regurgitation.  5. The aortic valve is normal in structure. Aortic valve regurgitation is mild. FINDINGS  Left Ventricle: Left ventricular ejection fraction, by estimation, is 65 to 70%. The left ventricle has normal function. The left ventricle has no regional wall motion abnormalities. The left ventricular internal cavity size was normal in size. There is  no left ventricular hypertrophy. Left ventricular diastolic parameters are consistent with Grade I diastolic dysfunction (impaired relaxation). Right Ventricle: The right ventricular size is normal. No increase in right ventricular wall thickness. Right ventricular systolic function is normal. Left Atrium: Left atrial  size was normal in size. Right Atrium: Right atrial size was normal in size. Pericardium: There is no evidence of pericardial effusion. Mitral Valve: The mitral valve is normal in structure. Trivial mitral valve regurgitation. Tricuspid Valve: The tricuspid valve is normal in structure. Tricuspid valve regurgitation is trivial. Aortic Valve: The aortic valve is normal in structure. Aortic valve regurgitation is mild. Aortic regurgitation PHT measures 413 msec. Aortic valve mean gradient measures 4.0 mmHg. Aortic valve peak gradient measures 7.3 mmHg. Aortic valve area, by VTI measures 2.62 cm. Pulmonic Valve: The pulmonic valve was normal in structure. Pulmonic valve regurgitation is not visualized. Aorta: The ascending aorta was not well visualized. IAS/Shunts: No atrial level shunt detected by color flow Doppler. Additional Comments: Mild inferior lateral hypo.  LEFT VENTRICLE PLAX 2D LVIDd:         3.70 cm     Diastology LVIDs:         2.40 cm     LV e' medial:    6.85 cm/s LV PW:         0.90 cm     LV E/e' medial:  11.2 LV IVS:        0.90 cm     LV e' lateral:   11.30 cm/s LVOT diam:     1.80 cm     LV E/e' lateral: 6.8  LV SV:         68 LV SV Index:   44 LVOT Area:     2.54 cm  LV Volumes (MOD) LV vol d, MOD A2C: 48.9 ml LV vol d, MOD A4C: 47.1 ml LV vol s, MOD A2C: 16.3 ml LV vol s, MOD A4C: 13.9 ml LV SV MOD A2C:     32.6 ml LV SV MOD A4C:     47.1 ml LV SV MOD BP:      33.3 ml RIGHT VENTRICLE RV Basal diam:  2.30 cm RV S prime:     18.60 cm/s TAPSE (M-mode): 2.4 cm LEFT ATRIUM             Index        RIGHT ATRIUM          Index LA diam:        2.60 cm 1.65 cm/m   RA Area:     9.15 cm LA Vol (A2C):   29.6 ml 18.81 ml/m  RA Volume:   15.50 ml 9.85 ml/m LA Vol (A4C):   26.9 ml 17.10 ml/m LA Biplane Vol: 28.1 ml 17.86 ml/m  AORTIC VALVE                    PULMONIC VALVE AV Area (Vmax):    2.15 cm     PV Vmax:          0.91 m/s AV Area (Vmean):   2.47 cm     PV Peak grad:     3.3 mmHg AV Area (VTI):     2.62 cm     PR End Diast Vel: 2.29 msec AV Vmax:           135.00 cm/s AV Vmean:          90.500 cm/s AV VTI:            0.261 m AV Peak Grad:      7.3 mmHg AV Mean Grad:      4.0 mmHg LVOT Vmax:         114.00 cm/s LVOT Vmean:  88.000 cm/s LVOT VTI:          0.269 m LVOT/AV VTI ratio: 1.03 AI PHT:            413 msec  AORTA Ao Root diam: 3.10 cm Ao Asc diam:  3.50 cm MITRAL VALVE MV Area (PHT): 5.54 cm     SHUNTS MV Decel Time: 137 msec     Systemic VTI:  0.27 m MV E velocity: 77.00 cm/s   Systemic Diam: 1.80 cm MV A velocity: 113.00 cm/s MV E/A ratio:  0.68 Dwayne D Callwood MD Electronically signed by Yolonda Kida MD Signature Date/Time: 08/13/2022/1:15:35 PM    Final    MR BRAIN WO CONTRAST  Result Date: 07/30/2022 CLINICAL DATA:  Altered mental status and dizziness EXAM: MRI HEAD WITHOUT CONTRAST TECHNIQUE: Multiplanar, multiecho pulse sequences of the brain and surrounding structures were obtained without intravenous contrast. COMPARISON:  11/09/2020. FINDINGS: Brain: No acute infarct, mass effect or extra-axial collection. No chronic microhemorrhage or siderosis. There is multifocal hyperintense  T2-weighted signal within the white matter. Parenchymal volume and CSF spaces are normal. The midline structures are normal. Vascular: Normal flow voids. Skull and upper cervical spine: Normal marrow signal. Sinuses/Orbits: Negative. Other: None. IMPRESSION: 1. No acute intracranial abnormality. 2. Multifocal hyperintense T2-weighted signal within the white matter, most consistent with chronic microvascular ischemia. Electronically Signed   By: Ulyses Jarred M.D.   On: 07/30/2022 21:05   CT HEAD WO CONTRAST (5MM)  Result Date: 07/30/2022 CLINICAL DATA:  Headache, new onset (Age >= 7y) EXAM: CT HEAD WITHOUT CONTRAST TECHNIQUE: Contiguous axial images were obtained from the base of the skull through the vertex without intravenous contrast. RADIATION DOSE REDUCTION: This exam was performed according to the departmental dose-optimization program which includes automated exposure control, adjustment of the mA and/or kV according to patient size and/or use of iterative reconstruction technique. COMPARISON:  01/12/2021 FINDINGS: Brain: No intracranial hemorrhage, mass effect, or midline shift. No hydrocephalus. The basilar cisterns are patent. Mild chronic small vessel ischemic change which is similar to prior exam. No evidence of territorial infarct or acute ischemia. No extra-axial or intracranial fluid collection. Vascular: No hyperdense vessel or unexpected calcification. Skull: Normal. Negative for fracture or focal lesion. Sinuses/Orbits: Paranasal sinuses and mastoid air cells are clear. The visualized orbits are unremarkable. Other: None. IMPRESSION: 1. No acute intracranial abnormality. 2. Mild chronic small vessel ischemic change. Electronically Signed   By: Keith Rake M.D.   On: 07/30/2022 18:51    TELEMETRY reviewed by me (LT) 08/15/2022 : NSR 70s-80s  EKG reviewed by me: inferior STE with reciprocal changes anterolaterally  Data reviewed by me (LT) 08/15/2022: hospitalist progress note nursing  notes, last 24h vitals tele labs imaging I/O    Principal Problem:   STEMI (ST elevation myocardial infarction) ([place]) Active Problems:   Essential hypertension   Dyslipidemia   AKI (acute kidney injury) ([place])   Hypokalemia    ASSESSMENT AND PLAN:   Stacey Francis is a 37yoF with a PMH of HTN, hypothyroidism who presented to [facility] ED the early morning of 3/10 with chest pressure that radiated up to her neck. Initial EKG showed inferior STE and reciprocal changes anterolaterally. Code STEMI was activated in the field by EMS and she was taken emergently to the cath lab overnight by Dr. Clayborn Bigness.  LHC revealed 99% Lcx, 95% distal RCA which were both treated with PCI with DES.  Residual 95% proximal and 75% distal LAD that was felt best to  be treated medically initially and staged at a later date.  Post procedurally, the patient developed a hematoma on her right wrist with significant swelling into her hand which improved following deflation of the TR band. Echo with preserved LVEF and grade 1 diastolic dysfunction.  # Inferior STEMI # 3v CAD # Right wrist swelling S/p PCI with DES x 1 to the distal RCA, PCI with DES x 1 to the proximal LCx with resolution of chest pressure overnight on 3/10.  Fortunately remains chest pain-free today, plan for staged intervention vs continued medical management of disease in her LAD. R hand swelling continues to improve -Continue aspirin 81 mg daily and Brilinta 90 mg twice daily uninterrupted for at least 1 year -Continue metoprolol tartrate 12.5 mg twice daily -Continue lisinopril 2.5 mg once daily -Continue atorvastatin 80 mg daily -Recommend keeping right hand elevated -discussed Cardiac rehab  # HFpEF Euvolemic on exam.  GDMT with metoprolol and lisinopril, relative hypotension precludes further escalation of GDMT  Ok for discharge today from a cardiac perspective. Will arrange for follow-up in clinic with Dr. Clayborn Bigness this Friday 3/15 at 12pm for  recheck of right hand swelling and planning for possible staged procedure of her LAD vs continued medical management.  This patient's plan of care was discussed and created with Dr. Saralyn Pilar and he is in agreement.  Signed: Tristan Schroeder , PA-C 08/15/2022, 9:02 AM [facility] Card[facility]

## 2022-08-16 LAB — LIPOPROTEIN A (LPA): Lipoprotein (a): 89.8 nmol/L — ABNORMAL HIGH (ref <75.0)

## 2022-08-17 DIAGNOSIS — I252 Old myocardial infarction: Secondary | ICD-10-CM | POA: Diagnosis not present

## 2022-08-17 DIAGNOSIS — I2510 Atherosclerotic heart disease of native coronary artery without angina pectoris: Secondary | ICD-10-CM | POA: Diagnosis not present

## 2022-08-17 DIAGNOSIS — Z955 Presence of coronary angioplasty implant and graft: Secondary | ICD-10-CM | POA: Diagnosis not present

## 2022-08-17 DIAGNOSIS — E782 Mixed hyperlipidemia: Secondary | ICD-10-CM | POA: Diagnosis not present

## 2022-08-17 DIAGNOSIS — E119 Type 2 diabetes mellitus without complications: Secondary | ICD-10-CM | POA: Diagnosis not present

## 2022-08-17 DIAGNOSIS — I10 Essential (primary) hypertension: Secondary | ICD-10-CM | POA: Diagnosis not present

## 2022-08-23 DIAGNOSIS — I252 Old myocardial infarction: Secondary | ICD-10-CM | POA: Diagnosis not present

## 2022-08-23 DIAGNOSIS — E119 Type 2 diabetes mellitus without complications: Secondary | ICD-10-CM | POA: Diagnosis not present

## 2022-08-23 DIAGNOSIS — M79601 Pain in right arm: Secondary | ICD-10-CM | POA: Diagnosis not present

## 2022-08-23 DIAGNOSIS — Z955 Presence of coronary angioplasty implant and graft: Secondary | ICD-10-CM | POA: Diagnosis not present

## 2022-08-23 DIAGNOSIS — E782 Mixed hyperlipidemia: Secondary | ICD-10-CM | POA: Diagnosis not present

## 2022-08-23 DIAGNOSIS — I10 Essential (primary) hypertension: Secondary | ICD-10-CM | POA: Diagnosis not present

## 2022-08-23 DIAGNOSIS — I2510 Atherosclerotic heart disease of native coronary artery without angina pectoris: Secondary | ICD-10-CM | POA: Diagnosis not present

## 2022-08-23 DIAGNOSIS — R0989 Other specified symptoms and signs involving the circulatory and respiratory systems: Secondary | ICD-10-CM | POA: Diagnosis not present

## 2022-08-24 ENCOUNTER — Emergency Department: Payer: No Typology Code available for payment source

## 2022-08-24 ENCOUNTER — Emergency Department
Admission: EM | Admit: 2022-08-24 | Discharge: 2022-08-24 | Discharge status: Against Medical Advice | Payer: No Typology Code available for payment source | Attending: Emergency Medicine | Admitting: Emergency Medicine

## 2022-08-24 ENCOUNTER — Other Ambulatory Visit: Payer: Self-pay

## 2022-08-24 DIAGNOSIS — R079 Chest pain, unspecified: Secondary | ICD-10-CM | POA: Diagnosis not present

## 2022-08-24 DIAGNOSIS — R0789 Other chest pain: Secondary | ICD-10-CM | POA: Diagnosis not present

## 2022-08-24 DIAGNOSIS — Z5321 Procedure and treatment not carried out due to patient leaving prior to being seen by health care provider: Secondary | ICD-10-CM | POA: Insufficient documentation

## 2022-08-24 LAB — CBC
HCT: 36.7 % (ref 36.0–46.0)
Hemoglobin: 12.2 g/dL (ref 12.0–15.0)
MCH: 29.2 pg (ref 26.0–34.0)
MCHC: 33.2 g/dL (ref 30.0–36.0)
MCV: 87.8 fL (ref 80.0–100.0)
Platelets: 303 10*3/uL (ref 150–400)
RBC: 4.18 MIL/uL (ref 3.87–5.11)
RDW: 12.2 % (ref 11.5–15.5)
WBC: 7.1 10*3/uL (ref 4.0–10.5)
nRBC: 0 % (ref 0.0–0.2)

## 2022-08-24 LAB — BASIC METABOLIC PANEL
Anion gap: 7 (ref 5–15)
BUN: 14 mg/dL (ref 8–23)
CO2: 24 mmol/L (ref 22–32)
Calcium: 9.1 mg/dL (ref 8.9–10.3)
Chloride: 104 mmol/L (ref 98–111)
Creatinine, Ser: 0.95 mg/dL (ref 0.44–1.00)
GFR, Estimated: >60 mL/min (ref >60)
Glucose, Bld: 106 mg/dL — ABNORMAL HIGH (ref 70–99)
Potassium: 4.2 mmol/L (ref 3.5–5.1)
Sodium: 135 mmol/L (ref 135–145)

## 2022-08-24 LAB — TROPONIN I (HIGH SENSITIVITY): Troponin I (High Sensitivity): 17 ng/L (ref <18)

## 2022-08-24 NOTE — ED Triage Notes (Signed)
Pt here with cp that started almost 2 hours ago. Pt states pain is centered but does not radiate. Pt states the pain is sharp in nature and is intermittent.

## 2022-08-25 DIAGNOSIS — I6789 Other cerebrovascular disease: Secondary | ICD-10-CM | POA: Diagnosis not present

## 2022-08-25 DIAGNOSIS — E782 Mixed hyperlipidemia: Secondary | ICD-10-CM | POA: Diagnosis not present

## 2022-08-25 DIAGNOSIS — E119 Type 2 diabetes mellitus without complications: Secondary | ICD-10-CM | POA: Diagnosis not present

## 2022-08-25 DIAGNOSIS — R69 Illness, unspecified: Secondary | ICD-10-CM | POA: Diagnosis not present

## 2022-08-25 DIAGNOSIS — R5383 Other fatigue: Secondary | ICD-10-CM | POA: Diagnosis not present

## 2022-08-25 DIAGNOSIS — Z955 Presence of coronary angioplasty implant and graft: Secondary | ICD-10-CM | POA: Diagnosis not present

## 2022-08-25 DIAGNOSIS — I2510 Atherosclerotic heart disease of native coronary artery without angina pectoris: Secondary | ICD-10-CM | POA: Diagnosis not present

## 2022-08-25 DIAGNOSIS — I2121 ST elevation (STEMI) myocardial infarction involving left circumflex coronary artery: Secondary | ICD-10-CM | POA: Diagnosis not present

## 2022-08-25 DIAGNOSIS — R0989 Other specified symptoms and signs involving the circulatory and respiratory systems: Secondary | ICD-10-CM | POA: Diagnosis not present

## 2022-08-25 DIAGNOSIS — M79601 Pain in right arm: Secondary | ICD-10-CM | POA: Diagnosis not present

## 2022-08-25 DIAGNOSIS — I10 Essential (primary) hypertension: Secondary | ICD-10-CM | POA: Diagnosis not present

## 2022-08-25 DIAGNOSIS — R0789 Other chest pain: Secondary | ICD-10-CM | POA: Diagnosis not present

## 2022-08-25 IMAGING — CR DG TIBIA/FIBULA 2V*R*
1 series · 2 of 2 positions shown · non-contrast
Comparison: None.

CLINICAL DATA: Status post trauma.

EXAM:
RIGHT TIBIA AND FIBULA - 2 VIEW

[Series 1: dg tibia/fibula right · 0.14mm/px · 2 of 2 slices shown]
[im 1/2]
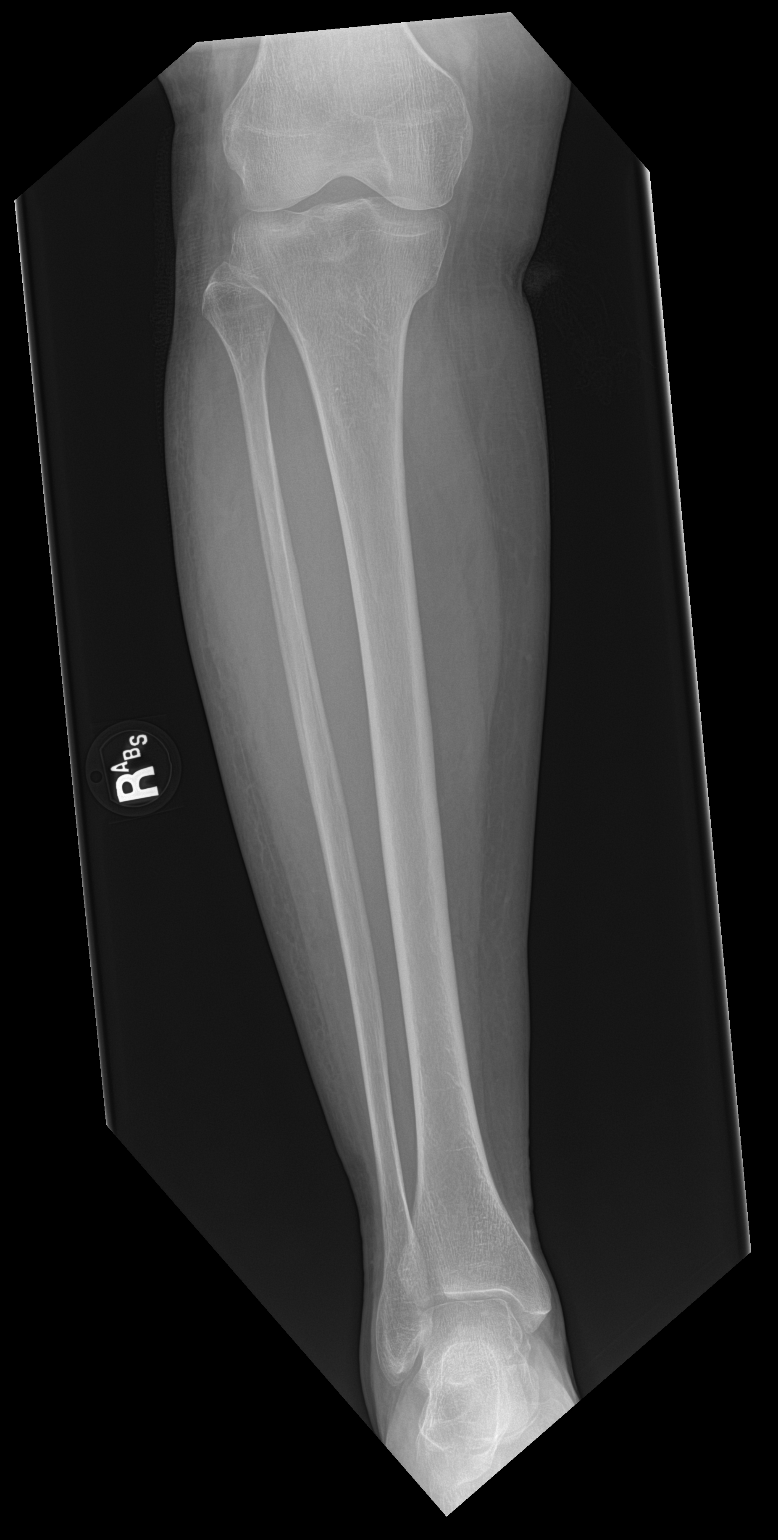
[im 2/2]
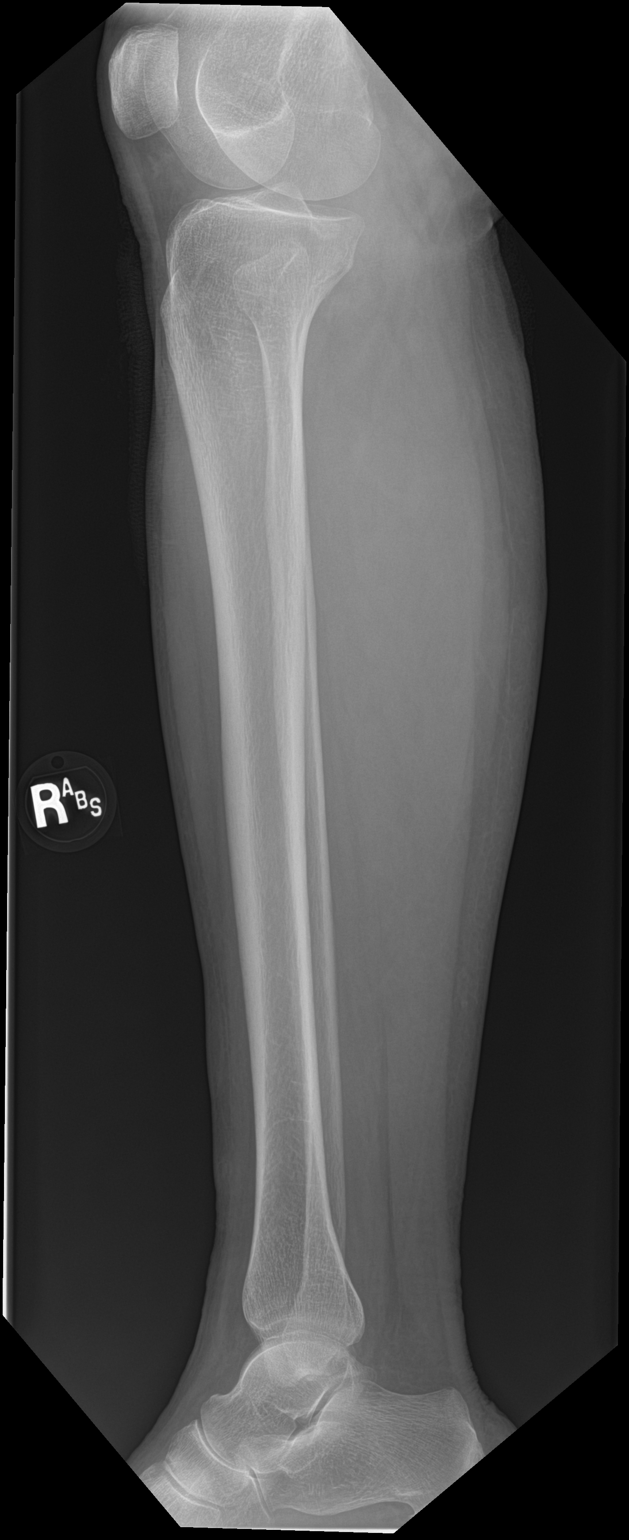

[2 of 2 positions shown; findings below may reference images not displayed]

FINDINGS: There is no evidence of fracture or other focal bone lesions. Very
mild soft tissue swelling is seen along the anterior aspect of the
distal right tibial shaft.
IMPRESSION: No acute osseous abnormality.

## 2022-09-04 ENCOUNTER — Other Ambulatory Visit: Payer: Self-pay

## 2022-09-04 ENCOUNTER — Emergency Department: Payer: No Typology Code available for payment source

## 2022-09-04 ENCOUNTER — Emergency Department
Admission: EM | Admit: 2022-09-04 | Discharge: 2022-09-04 | Disposition: A | Discharge status: Home / Self Care | Payer: No Typology Code available for payment source | Attending: Emergency Medicine | Admitting: Emergency Medicine

## 2022-09-04 DIAGNOSIS — R079 Chest pain, unspecified: Secondary | ICD-10-CM | POA: Diagnosis not present

## 2022-09-04 DIAGNOSIS — R002 Palpitations: Secondary | ICD-10-CM | POA: Diagnosis not present

## 2022-09-04 DIAGNOSIS — I10 Essential (primary) hypertension: Secondary | ICD-10-CM | POA: Insufficient documentation

## 2022-09-04 LAB — BASIC METABOLIC PANEL
Anion gap: 10 (ref 5–15)
BUN: 16 mg/dL (ref 8–23)
CO2: 24 mmol/L (ref 22–32)
Calcium: 9.3 mg/dL (ref 8.9–10.3)
Chloride: 107 mmol/L (ref 98–111)
Creatinine, Ser: 0.93 mg/dL (ref 0.44–1.00)
GFR, Estimated: >60 mL/min (ref >60)
Glucose, Bld: 123 mg/dL — ABNORMAL HIGH (ref 70–99)
Potassium: 4.5 mmol/L (ref 3.5–5.1)
Sodium: 141 mmol/L (ref 135–145)

## 2022-09-04 LAB — TROPONIN I (HIGH SENSITIVITY)
Troponin I (High Sensitivity): 7 ng/L (ref <18)
Troponin I (High Sensitivity): 8 ng/L (ref <18)

## 2022-09-04 LAB — CBC
HCT: 35.2 % — ABNORMAL LOW (ref 36.0–46.0)
Hemoglobin: 11.6 g/dL — ABNORMAL LOW (ref 12.0–15.0)
MCH: 29.6 pg (ref 26.0–34.0)
MCHC: 33 g/dL (ref 30.0–36.0)
MCV: 89.8 fL (ref 80.0–100.0)
Platelets: 215 10*3/uL (ref 150–400)
RBC: 3.92 MIL/uL (ref 3.87–5.11)
RDW: 12.4 % (ref 11.5–15.5)
WBC: 5.1 10*3/uL (ref 4.0–10.5)
nRBC: 0 % (ref 0.0–0.2)

## 2022-09-04 NOTE — ED Provider Notes (Signed)
[facility] Provider Note    Event Date/Time   First MD Initiated Contact with Patient 09/04/22 1502     (approximate)   History   Hypertension   HPI  Stacey Francis is a 67 y.o. female past medical history of recent STEMI who presents with concern palpitations elevated blood pressure.  Patient says that around noon today she felt very fatigued.  She laid down checked her blood pressure was elevated in the 170s.  She called her doctor and was told to take an extra dose of her antihypertensive medication and to recheck her heart rate.  She was calculating her heart rate when she started to feel palpitations like her heart was racing and it made her anxious.  Lasted for about 5 to 10 minutes.  Patient tells me she is not having chest pain but did feel a twinge/comfort in her chest that lasted for about an hour when she was having the fatigue earlier.  Denies dyspnea.  Denies fevers chills nausea vomiting or abdominal pain or urinary symptoms.  Did have an episode of loose stool once while she was feeling fatigued.  Currently she is feeling improved.  Tells me that she has had this twinge sensation in her chest several times since her recent heart attack.  Was never evaluated for it because did not accompany any other additional symptoms.  Says that when she had her heart attack she did not have chest pain but became very warm and diaphoretic and had a warm sensation in her chest and does not feel that way today.  Reviewed the last cardiology note from 3/22.  At that time patient was noted to not be feeling very well with general fatigue weakness and lightheadedness.  Patient had recent STEMI inferior posterior requiring stent to circumflex and RCA, patient has residual anterior LAD disease with significant ectatic areas making intervention hide the patient is being treated medically.  Is currently on aspirin and Plavix.     Past Medical History:  Diagnosis Date    Hypertension    Sinus infection     Patient Active Problem List   Diagnosis Date Noted   STEMI (ST elevation myocardial infarction) 08/13/2022   Essential hypertension 08/13/2022   Dyslipidemia 08/13/2022   AKI (acute kidney injury) 08/13/2022   Hypokalemia 08/13/2022     Physical Exam  Triage Vital Signs: ED Triage Vitals [09/04/22 1430]  Enc Vitals Group     BP (!) 152/75     Pulse Rate 89     Resp 18     Temp 98 F (36.7 C)     Temp src      SpO2 100 %     Weight 116 lb (52.6 kg)     Height 5\' 3"  (1.6 m)     Head Circumference      Peak Flow      Pain Score 5     Pain Loc      Pain Edu?      Excl. in [place]?     Most recent vital signs: Vitals:   09/04/22 1824 09/04/22 1825  BP: (!) 140/70   Pulse: 75   Resp: 18   Temp:  97.8 F (36.6 C)  SpO2: 99%      General: Awake, no distress.  CV:  Good peripheral perfusion.  Resp:  Normal effort.  Abd:  No distention.  Abdomen is soft and nontender Neuro:  Awake, Alert, Oriented x 3  Other:  No peripheral edema   ED Results / Procedures / Treatments  Labs (all labs ordered are listed, but only abnormal results are displayed) Labs Reviewed  BASIC METABOLIC PANEL - Abnormal; Notable for the following components:      Result Value   Glucose, Bld 123 (*)    All other components within normal limits  CBC - Abnormal; Notable for the following components:   Hemoglobin 11.6 (*)    HCT 35.2 (*)    All other components within normal limits  TROPONIN I (HIGH SENSITIVITY)  TROPONIN I (HIGH SENSITIVITY)     EKG  EKG reviewed interpreted myself shows sinus rhythm with normal axis and intervals inferior T wave inversions   RADIOLOGY I reviewed and interpreted the CXR which does not show any acute cardiopulmonary process    PROCEDURES:  Critical Care performed: No  .1-3 Lead EKG Interpretation  Performed by: Rada Hay, MD Authorized by: Rada Hay, MD     Interpretation:  normal     ECG rate assessment: normal     Rhythm: sinus rhythm     Ectopy: none     Conduction: normal     The patient is on the cardiac monitor to evaluate for evidence of arrhythmia and/or significant heart rate changes.   MEDICATIONS ORDERED IN ED: Medications - No data to display   IMPRESSION / MDM / [place] / ED COURSE  I reviewed the triage vital signs and the nursing notes.                              Patient's presentation is most consistent with acute presentation with potential threat to life or bodily function.  Differential diagnosis includes, but is not limited to, ACS, arrhythmia, anxiety, electrode abnormality, medication side effect  67 year old female who presents because of elevated blood pressure and palpitations.  Started feeling fatigue this afternoon checked her blood pressure was elevated in the 170s.  After speaking with her doctor she then checked her heart rate and that she was doing so developed palpitations.  She does not endorse any frank chest pain but does says she is felt a twinge in her chest that lasted for about an hour but it was nonpleuritic nonexertional associate with nausea or diaphoresis.  Did not feel similar to her recent STEMI.  Patient now feels symptomatically improved.  She denies any other infectious symptoms.  Blood pressure is pretty good here 140s over 70s.  EKG does show some T wave inversions inferior leads looks slightly more pronounced from prior EKG.  Troponins x 2 are negative and patient is not having any active chest pain.  Given her recent history I did discuss with Dr. Clayborn Bigness who feels comfortable with patient going home and following up.  Patient also agreeable with plan.  We discussed return precaution.       FINAL CLINICAL IMPRESSION(S) / ED DIAGNOSES   Final diagnoses:  Palpitations     Rx / DC Orders   ED Discharge Orders     None        Note:  This document was prepared using Dragon voice  recognition software and may include unintentional dictation errors.   Rada Hay, MD 09/04/22 [phone]

## 2022-09-04 NOTE — Discharge Instructions (Addendum)
Your blood work was all reassuring.  If you develop chest pain or any symptoms that are worrisome to you do not hesitate to return to the ER or call Dr. Etta Quill office.

## 2022-09-04 NOTE — ED Triage Notes (Signed)
Pt to ED via POV for heart palpitations. Pt referred from cardiologist. Pt has a hx of cardiac stents placed on march 10th 2024. Pt states she is having chest pain. Denies SOB.

## 2022-09-06 DIAGNOSIS — R69 Illness, unspecified: Secondary | ICD-10-CM | POA: Diagnosis not present

## 2022-09-11 ENCOUNTER — Other Ambulatory Visit: Payer: Self-pay

## 2022-09-11 ENCOUNTER — Encounter: Payer: No Typology Code available for payment source | Attending: Internal Medicine

## 2022-09-11 DIAGNOSIS — Z955 Presence of coronary angioplasty implant and graft: Secondary | ICD-10-CM | POA: Insufficient documentation

## 2022-09-11 DIAGNOSIS — I213 ST elevation (STEMI) myocardial infarction of unspecified site: Secondary | ICD-10-CM | POA: Insufficient documentation

## 2022-09-11 NOTE — Progress Notes (Signed)
Virtual Visit completed. Patient informed on EP and RD appointment and 6 Minute walk test. Patient also informed of patient health questionnaires on My Chart. Patient Verbalizes understanding. Visit diagnosis can be found in [facility] 08/13/2022.

## 2022-09-12 DIAGNOSIS — M79601 Pain in right arm: Secondary | ICD-10-CM | POA: Diagnosis not present

## 2022-09-12 DIAGNOSIS — R0602 Shortness of breath: Secondary | ICD-10-CM | POA: Diagnosis not present

## 2022-09-12 DIAGNOSIS — Z955 Presence of coronary angioplasty implant and graft: Secondary | ICD-10-CM | POA: Diagnosis not present

## 2022-09-12 DIAGNOSIS — I2089 Other forms of angina pectoris: Secondary | ICD-10-CM | POA: Diagnosis not present

## 2022-09-12 DIAGNOSIS — R5383 Other fatigue: Secondary | ICD-10-CM | POA: Diagnosis not present

## 2022-09-12 DIAGNOSIS — E119 Type 2 diabetes mellitus without complications: Secondary | ICD-10-CM | POA: Diagnosis not present

## 2022-09-12 DIAGNOSIS — I252 Old myocardial infarction: Secondary | ICD-10-CM | POA: Diagnosis not present

## 2022-09-12 DIAGNOSIS — I2510 Atherosclerotic heart disease of native coronary artery without angina pectoris: Secondary | ICD-10-CM | POA: Diagnosis not present

## 2022-09-12 DIAGNOSIS — E782 Mixed hyperlipidemia: Secondary | ICD-10-CM | POA: Diagnosis not present

## 2022-09-12 DIAGNOSIS — I10 Essential (primary) hypertension: Secondary | ICD-10-CM | POA: Diagnosis not present

## 2022-09-18 ENCOUNTER — Encounter: Payer: No Typology Code available for payment source | Admitting: *Deleted

## 2022-09-18 VITALS — Ht 63.5 in | Wt 116.7 lb

## 2022-09-18 DIAGNOSIS — I213 ST elevation (STEMI) myocardial infarction of unspecified site: Secondary | ICD-10-CM | POA: Diagnosis not present

## 2022-09-18 DIAGNOSIS — Z955 Presence of coronary angioplasty implant and graft: Secondary | ICD-10-CM

## 2022-09-18 NOTE — Patient Instructions (Signed)
Patient Instructions  Patient Details  Name: Stacey Francis MRN: [id number] Date of Birth: [birth year] Referring Provider:  Alwyn Pea, MD  Below are your personal goals for exercise, nutrition, and risk factors. Our goal is to help you stay on track towards obtaining and maintaining these goals. We will be discussing your progress on these goals with you throughout the program.  Initial Exercise Prescription:  Initial Exercise Prescription - 09/18/22 1600       Date of Initial Exercise RX and Referring Provider   Date 09/18/22    Referring Provider [place]      Oxygen   Maintain Oxygen Saturation 88% or higher      Treadmill   MPH 2    Grade 1    Minutes 15    METs 2.81      Recumbant Bike   Level 2    RPM 50    Watts 20    Minutes 15    METs 3.18      NuStep   Level 2    SPM 80    Minutes 15    METs 3.18      REL-XR   Level 2    Speed 50    Minutes 15    METs 3.18      T5 Nustep   Level 1    SPM 80    Minutes 15    METs 3.18      Prescription Details   Frequency (times per week) 3    Duration Progress to 30 minutes of continuous aerobic without signs/symptoms of physical distress      Intensity   THRR 40-80% of Max Heartrate 111-139    Ratings of Perceived Exertion 11-13    Perceived Dyspnea 0-4      Progression   Progression Continue to progress workloads to maintain intensity without signs/symptoms of physical distress.      Resistance Training   Training Prescription Yes    Weight 3    Reps 10-15             Exercise Goals: Frequency: Be able to perform aerobic exercise two to three times per week in program working toward 2-5 days per week of home exercise.  Intensity: Work with a perceived exertion of 11 (fairly light) - 15 (hard) while following your exercise prescription.  We will make changes to your prescription with you as you progress through the program.   Duration: Be able to do 30 to 45 minutes of continuous  aerobic exercise in addition to a 5 minute warm-up and a 5 minute cool-down routine.   Nutrition Goals: Your personal nutrition goals will be established when you do your nutrition analysis with the dietician.  The following are general nutrition guidelines to follow: Cholesterol < /day Sodium < /day Fiber: Women over 50 yrs - 21 grams per day  Personal Goals:  Personal Goals and Risk Factors at Admission - 09/18/22 1657       Core Components/Risk Factors/Patient Goals on Admission    Weight Management Yes;Weight Gain    Intervention Weight Management: Develop a combined nutrition and exercise program designed to reach desired caloric intake, while maintaining appropriate intake of nutrient and fiber, sodium and fats, and appropriate energy expenditure required for the weight goal.;Weight Management: Provide education and appropriate resources to help participant work on and attain dietary goals.;Weight Management/Obesity: Establish reasonable short term and long term weight goals.    Admit Weight 116 lb 11.2 oz (52.9  kg)    Goal Weight: Short Term 120 lb (54.4 kg)    Goal Weight: Long Term 120 lb (54.4 kg)    Expected Outcomes Short Term: Continue to assess and modify interventions until short term weight is achieved;Long Term: Adherence to nutrition and physical activity/exercise program aimed toward attainment of established weight goal;Weight Maintenance: Understanding of the daily nutrition guidelines, which includes 25-35% calories from fat, 7% or less cal from saturated fats, less than  cholesterol, less than 1.5gm of sodium, & 5 or more servings of fruits and vegetables daily;Understanding recommendations for meals to include 15-35% energy as protein, 25-35% energy from fat, 35-60% energy from carbohydrates, less than  of dietary cholesterol, 20-35 gm of total fiber daily;Understanding of distribution of calorie intake throughout the day with the consumption of 4-5  meals/snacks;Weight Gain: Understanding of general recommendations for a high calorie, high protein meal plan that promotes weight gain by distributing calorie intake throughout the day with the consumption for 4-5 meals, snacks, and/or supplements    Hypertension Yes    Intervention Provide education on lifestyle modifcations including regular physical activity/exercise, weight management, moderate sodium restriction and increased consumption of fresh fruit, vegetables, and low fat dairy, alcohol moderation, and smoking cessation.;Monitor prescription use compliance.    Expected Outcomes Short Term: Continued assessment and intervention until BP is < 140/45mm HG in hypertensive participants. < 130/4mm HG in hypertensive participants with diabetes, heart failure or chronic kidney disease.;Long Term: Maintenance of blood pressure at goal levels.    Lipids Yes    Intervention Provide education and support for participant on nutrition & aerobic/resistive exercise along with prescribed medications to achieve LDL 70mg , HDL >40mg .    Expected Outcomes Short Term: Participant states understanding of desired cholesterol values and is compliant with medications prescribed. Participant is following exercise prescription and nutrition guidelines.;Long Term: Cholesterol controlled with medications as prescribed, with individualized exercise RX and with personalized nutrition plan. Value goals: LDL < , HDL > 40 mg.             Tobacco Use Initial Evaluation: Social History   Tobacco Use  Smoking Status Never  Smokeless Tobacco Never    Exercise Goals and Review:  Exercise Goals     Row Name 09/18/22 1655             Exercise Goals   Increase Physical Activity Yes       Intervention Provide advice, education, support and counseling about physical activity/exercise needs.;Develop an individualized exercise prescription for aerobic and resistive training based on initial evaluation findings,  risk stratification, comorbidities and participant's personal goals.       Expected Outcomes Short Term: Attend rehab on a regular basis to increase amount of physical activity.;Long Term: Add in home exercise to make exercise part of routine and to increase amount of physical activity.;Long Term: Exercising regularly at least 3-5 days a week.       Increase Strength and Stamina Yes       Intervention Provide advice, education, support and counseling about physical activity/exercise needs.;Develop an individualized exercise prescription for aerobic and resistive training based on initial evaluation findings, risk stratification, comorbidities and participant's personal goals.       Expected Outcomes Short Term: Increase workloads from initial exercise prescription for resistance, speed, and METs.;Short Term: Perform resistance training exercises routinely during rehab and add in resistance training at home;Long Term: Improve cardiorespiratory fitness, muscular endurance and strength as measured by increased METs and functional capacity ( )  Able to understand and use rate of perceived exertion (RPE) scale Yes       Intervention Provide education and explanation on how to use RPE scale       Expected Outcomes Short Term: Able to use RPE daily in rehab to express subjective intensity level;Long Term:  Able to use RPE to guide intensity level when exercising independently       Able to understand and use Dyspnea scale Yes       Intervention Provide education and explanation on how to use Dyspnea scale       Expected Outcomes Short Term: Able to use Dyspnea scale daily in rehab to express subjective sense of shortness of breath during exertion;Long Term: Able to use Dyspnea scale to guide intensity level when exercising independently       Knowledge and understanding of Target Heart Rate Range (THRR) Yes       Intervention Provide education and explanation of THRR including how the numbers were  predicted and where they are located for reference       Expected Outcomes Short Term: Able to state/look up THRR;Long Term: Able to use THRR to govern intensity when exercising independently;Short Term: Able to use daily as guideline for intensity in rehab       Able to check pulse independently Yes       Intervention Provide education and demonstration on how to check pulse in carotid and radial arteries.;Review the importance of being able to check your own pulse for safety during independent exercise       Expected Outcomes Short Term: Able to explain why pulse checking is important during independent exercise       Understanding of Exercise Prescription Yes       Intervention Provide education, explanation, and written materials on patient's individual exercise prescription       Expected Outcomes Short Term: Able to explain program exercise prescription;Long Term: Able to explain home exercise prescription to exercise independently                Copy of goals given to participant.

## 2022-09-18 NOTE — Progress Notes (Signed)
Cardiac Individual Treatment Plan  Patient Details  Name: Stacey Francis MRN: [id number] Date of Birth: [birth year] Referring Provider:   Flowsheet Row Cardiac Rehab from 09/18/2022 in [facility] Cardiac and Pulmonary Rehab  Referring Provider callwood       Initial Encounter Date:  Flowsheet Row Cardiac Rehab from 09/18/2022 in [facility] Cardiac and Pulmonary Rehab  Date 09/18/22       Visit Diagnosis: ST elevation myocardial infarction (STEMI), unspecified artery  Status post coronary artery stent placement  Patient's Home Medications on Admission:  Current Outpatient Medications:    aspirin 81 MG chewable tablet, Chew 1 tablet (81 mg total) by mouth daily. (Patient not taking: Reported on 09/11/2022), Disp: 90 tablet, Rfl: 3   atorvastatin (LIPITOR) 80 MG tablet, Take 1 tablet (80 mg total) by mouth daily., Disp: 30 tablet, Rfl: 11   cetirizine (ZYRTEC) 10 MG tablet, Take by mouth., Disp: , Rfl:    clopidogrel (PLAVIX) 75 MG tablet, Take by mouth., Disp: , Rfl:    levothyroxine (SYNTHROID) 25 MCG tablet, Take 25 mcg by mouth daily before breakfast., Disp: , Rfl:    levothyroxine (SYNTHROID) 25 MCG tablet, Take by mouth. (Patient not taking: Reported on 09/11/2022), Disp: , Rfl:    lisinopril (ZESTRIL) 2.5 MG tablet, Take 1 tablet (2.5 mg total) by mouth daily., Disp: 30 tablet, Rfl: 2   metoprolol succinate (TOPROL-XL) 25 MG 24 hr tablet, Take by mouth., Disp: , Rfl:    metoprolol tartrate (LOPRESSOR) 25 MG tablet, Take 0.5 tablets (12.5 mg total) by mouth 2 (two) times daily. (Patient not taking: Reported on 09/11/2022), Disp: 30 tablet, Rfl: 5   metoprolol tartrate (LOPRESSOR) 25 MG tablet, Take by mouth. (Patient not taking: Reported on 09/11/2022), Disp: , Rfl:    naproxen (NAPROSYN) 500 MG tablet, Take 500 mg by mouth 2 (two) times daily., Disp: , Rfl:    nitroGLYCERIN (NITROSTAT) 0.4 MG SL tablet, Place 1 tablet (0.4 mg total) under the tongue every 5 (five) minutes as needed for chest pain.,  Disp: 30 tablet, Rfl: 0   ticagrelor (BRILINTA) 90 MG TABS tablet, Take 1 tablet (90 mg total) by mouth 2 (two) times daily. (Patient not taking: Reported on 09/11/2022), Disp: 180 tablet, Rfl: 3  Past Medical History: Past Medical History:  Diagnosis Date   Hypertension    Sinus infection     Tobacco Use: Social History   Tobacco Use  Smoking Status Never  Smokeless Tobacco Never    Labs: Review Flowsheet       Latest Ref Rng & Units 08/13/2022 08/14/2022  Labs for ITP Cardiac and Pulmonary Rehab  Cholestrol 0 - 200 mg/dL 409  -  LDL (calc) 0 - 99 mg/dL 811  -  HDL-C >91 mg/dL 84  -  Trlycerides <478 mg/dL 70  -  Hemoglobin G9F 4.8 - 5.6 % - 5.7      Exercise Target Goals: Exercise Program Goal: Individual exercise prescription set using results from initial 6 min walk test and THRR while considering  patient's activity barriers and safety.   Exercise Prescription Goal: Initial exercise prescription builds to 30-45 minutes a day of aerobic activity, 2-3 days per week.  Home exercise guidelines will be given to patient during program as part of exercise prescription that the participant will acknowledge.   Education: Aerobic Exercise: - Group verbal and visual presentation on the components of exercise prescription. Introduces F.I.T.T principle from ACSM for exercise prescriptions.  Reviews F.I.T.T. principles of aerobic exercise including progression.  Written material given at graduation. Flowsheet Row Cardiac Rehab from 09/18/2022 in [facility] Cardiac and Pulmonary Rehab  Education need identified 09/18/22       Education: Resistance Exercise: - Group verbal and visual presentation on the components of exercise prescription. Introduces F.I.T.T principle from ACSM for exercise prescriptions  Reviews F.I.T.T. principles of resistance exercise including progression. Written material given at graduation.    Education: Exercise & Equipment Safety: - Individual verbal  instruction and demonstration of equipment use and safety with use of the equipment. Flowsheet Row Cardiac Rehab from 09/18/2022 in [facility] Cardiac and Pulmonary Rehab  Date 09/18/22  Educator [facility]  Instruction Review Code 1- Verbalizes Understanding       Education: Exercise Physiology & General Exercise Guidelines: - Group verbal and written instruction with models to review the exercise physiology of the cardiovascular system and associated critical values. Provides general exercise guidelines with specific guidelines to those with heart or lung disease.  Flowsheet Row Cardiac Rehab from 09/18/2022 in [facility] Cardiac and Pulmonary Rehab  Education need identified 09/18/22       Education: Flexibility, Balance, Mind/Body Relaxation: - Group verbal and visual presentation with interactive activity on the components of exercise prescription. Introduces F.I.T.T principle from ACSM for exercise prescriptions. Reviews F.I.T.T. principles of flexibility and balance exercise training including progression. Also discusses the mind body connection.  Reviews various relaxation techniques to help reduce and manage stress (i.e. Deep breathing, progressive muscle relaxation, and visualization). Balance handout provided to take home. Written material given at graduation.   Activity Barriers & Risk Stratification:  Activity Barriers & Cardiac Risk Stratification - 09/18/22 1652       Activity Barriers & Cardiac Risk Stratification   Activity Barriers None    Cardiac Risk Stratification High             6 Minute Walk:  6 Minute Walk     Row Name 09/18/22 1651         6 Minute Walk   Phase Initial     Distance 1135 feet     Walk Time 6 minutes     # of Rest Breaks 0     MPH 2.15     METS 3.18     RPE 9     Perceived Dyspnea  0     VO2 Peak 11.13     Symptoms No     Resting HR 83 bpm     Resting BP 132/82     Resting Oxygen Saturation  99 %     Exercise Oxygen Saturation  during 6 min  walk 97 %     Max Ex. HR 111 bpm     Max Ex. BP 130/82     2 Minute Post BP 118/62              Oxygen Initial Assessment:   Oxygen Re-Evaluation:   Oxygen Discharge (Final Oxygen Re-Evaluation):   Initial Exercise Prescription:  Initial Exercise Prescription - 09/18/22 1600       Date of Initial Exercise RX and Referring Provider   Date 09/18/22    Referring Provider callwood      Oxygen   Maintain Oxygen Saturation 88% or higher      Treadmill   MPH 2    Grade 1    Minutes 15    METs 2.81      Recumbant Bike   Level 2    RPM 50    Watts 20  Minutes 15    METs 3.18      NuStep   Level 2    SPM 80    Minutes 15    METs 3.18      REL-XR   Level 2    Speed 50    Minutes 15    METs 3.18      T5 Nustep   Level 1    SPM 80    Minutes 15    METs 3.18      Prescription Details   Frequency (times per week) 3    Duration Progress to 30 minutes of continuous aerobic without signs/symptoms of physical distress      Intensity   THRR 40-80% of Max Heartrate 111-139    Ratings of Perceived Exertion 11-13    Perceived Dyspnea 0-4      Progression   Progression Continue to progress workloads to maintain intensity without signs/symptoms of physical distress.      Resistance Training   Training Prescription Yes    Weight 3    Reps 10-15             Perform Capillary Blood Glucose checks as needed.  Exercise Prescription Changes:   Exercise Prescription Changes     Row Name 09/18/22 1600             Response to Exercise   Blood Pressure (Admit) 132/82       Blood Pressure (Exercise) 130/82       Blood Pressure (Exit) 118/62       Heart Rate (Admit) 83 bpm       Heart Rate (Exercise) 111 bpm       Heart Rate (Exit) 80 bpm       Oxygen Saturation (Admit) 99 %       Oxygen Saturation (Exercise) 97 %       Oxygen Saturation (Exit) 100 %       Rating of Perceived Exertion (Exercise) 9       Perceived Dyspnea (Exercise) 0        Symptoms none       Comments 6 MWT results                Exercise Comments:   Exercise Goals and Review:   Exercise Goals     Row Name 09/18/22 1655             Exercise Goals   Increase Physical Activity Yes       Intervention Provide advice, education, support and counseling about physical activity/exercise needs.;Develop an individualized exercise prescription for aerobic and resistive training based on initial evaluation findings, risk stratification, comorbidities and participant's personal goals.       Expected Outcomes Short Term: Attend rehab on a regular basis to increase amount of physical activity.;Long Term: Add in home exercise to make exercise part of routine and to increase amount of physical activity.;Long Term: Exercising regularly at least 3-5 days a week.       Increase Strength and Stamina Yes       Intervention Provide advice, education, support and counseling about physical activity/exercise needs.;Develop an individualized exercise prescription for aerobic and resistive training based on initial evaluation findings, risk stratification, comorbidities and participant's personal goals.       Expected Outcomes Short Term: Increase workloads from initial exercise prescription for resistance, speed, and METs.;Short Term: Perform resistance training exercises routinely during rehab and add in resistance training at home;Long Term: Improve cardiorespiratory fitness, muscular  endurance and strength as measured by increased METs and functional capacity ( )       Able to understand and use rate of perceived exertion (RPE) scale Yes       Intervention Provide education and explanation on how to use RPE scale       Expected Outcomes Short Term: Able to use RPE daily in rehab to express subjective intensity level;Long Term:  Able to use RPE to guide intensity level when exercising independently       Able to understand and use Dyspnea scale Yes       Intervention  Provide education and explanation on how to use Dyspnea scale       Expected Outcomes Short Term: Able to use Dyspnea scale daily in rehab to express subjective sense of shortness of breath during exertion;Long Term: Able to use Dyspnea scale to guide intensity level when exercising independently       Knowledge and understanding of Target Heart Rate Range (THRR) Yes       Intervention Provide education and explanation of THRR including how the numbers were predicted and where they are located for reference       Expected Outcomes Short Term: Able to state/look up THRR;Long Term: Able to use THRR to govern intensity when exercising independently;Short Term: Able to use daily as guideline for intensity in rehab       Able to check pulse independently Yes       Intervention Provide education and demonstration on how to check pulse in carotid and radial arteries.;Review the importance of being able to check your own pulse for safety during independent exercise       Expected Outcomes Short Term: Able to explain why pulse checking is important during independent exercise       Understanding of Exercise Prescription Yes       Intervention Provide education, explanation, and written materials on patient's individual exercise prescription       Expected Outcomes Short Term: Able to explain program exercise prescription;Long Term: Able to explain home exercise prescription to exercise independently                Exercise Goals Re-Evaluation :   Discharge Exercise Prescription (Final Exercise Prescription Changes):  Exercise Prescription Changes - 09/18/22 1600       Response to Exercise   Blood Pressure (Admit) 132/82    Blood Pressure (Exercise) 130/82    Blood Pressure (Exit) 118/62    Heart Rate (Admit) 83 bpm    Heart Rate (Exercise) 111 bpm    Heart Rate (Exit) 80 bpm    Oxygen Saturation (Admit) 99 %    Oxygen Saturation (Exercise) 97 %    Oxygen Saturation (Exit) 100 %    Rating of  Perceived Exertion (Exercise) 9    Perceived Dyspnea (Exercise) 0    Symptoms none    Comments 6 MWT results             Nutrition:  Target Goals: Understanding of nutrition guidelines, daily intake of sodium 1500mg , cholesterol 200mg , calories 30% from fat and 7% or less from saturated fats, daily to have 5 or more servings of fruits and vegetables.  Education: All About Nutrition: -Group instruction provided by verbal, written material, interactive activities, discussions, models, and posters to present general guidelines for heart healthy nutrition including fat, fiber, MyPlate, the role of sodium in heart healthy nutrition, utilization of the nutrition label, and utilization of this knowledge for meal planning. Follow  up email sent as well. Written material given at graduation. Flowsheet Row Cardiac Rehab from 09/18/2022 in [facility] Cardiac and Pulmonary Rehab  Education need identified 09/18/22       Biometrics:  Pre Biometrics - 09/18/22 1656       Pre Biometrics   Height 5' 3.5" (1.613 m)    Weight 116 lb 11.2 oz (52.9 kg)    Waist Circumference 28 inches    Hip Circumference 36.5 inches    Waist to Hip Ratio 0.77 %    BMI (Calculated) 20.35    Single Leg Stand 30 seconds              Nutrition Therapy Plan and Nutrition Goals:  Nutrition Therapy & Goals - 09/18/22 1657       Intervention Plan   Intervention Prescribe, educate and counsel regarding individualized specific dietary modifications aiming towards targeted core components such as weight, hypertension, lipid management, diabetes, heart failure and other comorbidities.    Expected Outcomes Short Term Goal: Understand basic principles of dietary content, such as calories, fat, sodium, cholesterol and nutrients.;Short Term Goal: A plan has been developed with personal nutrition goals set during dietitian appointment.;Long Term Goal: Adherence to prescribed nutrition plan.             Nutrition  Assessments:  MEDIFICTS Score Key: ?70 Need to make dietary changes  40-70 Heart Healthy Diet ? 40 Therapeutic Level Cholesterol Diet  Flowsheet Row Cardiac Rehab from 09/18/2022 in [facility] Cardiac and Pulmonary Rehab  Picture Your Plate Total Score on Admission 82      Picture Your Plate Scores: <16 Unhealthy dietary pattern with much room for improvement. 41-50 Dietary pattern unlikely to meet recommendations for good health and room for improvement. 51-60 More healthful dietary pattern, with some room for improvement.  >60 Healthy dietary pattern, although there may be some specific behaviors that could be improved.    Nutrition Goals Re-Evaluation:   Nutrition Goals Discharge (Final Nutrition Goals Re-Evaluation):   Psychosocial: Target Goals: Acknowledge presence or absence of significant depression and/or stress, maximize coping skills, provide positive support system. Participant is able to verbalize types and ability to use techniques and skills needed for reducing stress and depression.   Education: Stress, Anxiety, and Depression - Group verbal and visual presentation to define topics covered.  Reviews how body is impacted by stress, anxiety, and depression.  Also discusses healthy ways to reduce stress and to treat/manage anxiety and depression.  Written material given at graduation.   Education: Sleep Hygiene -Provides group verbal and written instruction about how sleep can affect your health.  Define sleep hygiene, discuss sleep cycles and impact of sleep habits. Review good sleep hygiene tips.    Initial Review & Psychosocial Screening:  Initial Psych Review & Screening - 09/11/22 1025       Initial Review   Current issues with Current Stress Concerns    Source of Stress Concerns Chronic Illness    Comments She has been more frustrated with her limitations and her health. She is not able to do the things that she wants to do. For the past two years she has been  trying to find out what is wrong with her and did not find it until now. Her doctor told her she still has a blockage and cant to anything about it now.      Family Dynamics   Good Support System? Yes    Comments Aviannah can look to her four kids for  support and are helpful.             Quality of Life Scores:   Quality of Life - 09/18/22 1658       Quality of Life   Select Quality of Life      Quality of Life Scores   Health/Function Pre 14.07 %    Socioeconomic Pre 26.08 %    Psych/Spiritual Pre 25 %    Family Pre 28.5 %    GLOBAL Pre 20.58 %            Scores of 19 and below usually indicate a poorer quality of life in these areas.  A difference of  2-3 points is a clinically meaningful difference.  A difference of 2-3 points in the total score of the Quality of Life Index has been associated with significant improvement in overall quality of life, self-image, physical symptoms, and general health in studies assessing change in quality of life.  PHQ-9: Review Flowsheet       09/18/2022  Depression screen PHQ 2/9  Decreased Interest 0  Down, Depressed, Hopeless 0  PHQ - 2 Score 0  Altered sleeping 0  Tired, decreased energy 1  Change in appetite 0  Feeling bad or failure about yourself  0  Trouble concentrating 0  Moving slowly or fidgety/restless 0  Suicidal thoughts 0  PHQ-9 Score 1  Difficult doing work/chores Not difficult at all   Interpretation of Total Score  Total Score Depression Severity:  1-4 = Minimal depression, 5-9 = Mild depression, 10-14 = Moderate depression, 15-19 = Moderately severe depression, 20-27 = Severe depression   Psychosocial Evaluation and Intervention:  Psychosocial Evaluation - 09/11/22 1032       Psychosocial Evaluation & Interventions   Interventions Encouraged to exercise with the program and follow exercise prescription;Relaxation education;Stress management education    Comments She has been more frustrated with her  limitations and her health. She is not able to do the things that she wants to do. For the past two years she has been trying to find out what is wrong with her and did not find it until now. Her doctor told her she still has a blockage and cant to anything about it now.Estefani can look to her four kids for support and are helpful.    Expected Outcomes Short: Start HeartTrack to help with mood. Long: Maintain a healthy mental state    Continue Psychosocial Services  Follow up required by staff             Psychosocial Re-Evaluation:   Psychosocial Discharge (Final Psychosocial Re-Evaluation):   Vocational Rehabilitation: Provide vocational rehab assistance to qualifying candidates.   Vocational Rehab Evaluation & Intervention:   Education: Education Goals: Education classes will be provided on a variety of topics geared toward better understanding of heart health and risk factor modification. Participant will state understanding/return demonstration of topics presented as noted by education test scores.  Learning Barriers/Preferences:  Learning Barriers/Preferences - 09/11/22 1024       Learning Barriers/Preferences   Learning Barriers None    Learning Preferences None             General Cardiac Education Topics:  AED/CPR: - Group verbal and written instruction with the use of models to demonstrate the basic use of the AED with the basic ABC's of resuscitation.   Anatomy and Cardiac Procedures: - Group verbal and visual presentation and models provide information about basic cardiac anatomy and function. Reviews the  testing methods done to diagnose heart disease and the outcomes of the test results. Describes the treatment choices: Medical Management, Angioplasty, or Coronary Bypass Surgery for treating various heart conditions including Myocardial Infarction, Angina, Valve Disease, and Cardiac Arrhythmias.  Written material given at graduation.   Medication  Safety: - Group verbal and visual instruction to review commonly prescribed medications for heart and lung disease. Reviews the medication, class of the drug, and side effects. Includes the steps to properly store meds and maintain the prescription regimen.  Written material given at graduation.   Intimacy: - Group verbal instruction through game format to discuss how heart and lung disease can affect sexual intimacy. Written material given at graduation..   Know Your Numbers and Heart Failure: - Group verbal and visual instruction to discuss disease risk factors for cardiac and pulmonary disease and treatment options.  Reviews associated critical values for Overweight/Obesity, Hypertension, Cholesterol, and Diabetes.  Discusses basics of heart failure: signs/symptoms and treatments.  Introduces Heart Failure Zone chart for action plan for heart failure.  Written material given at graduation.   Infection Prevention: - Provides verbal and written material to individual with discussion of infection control including proper hand washing and proper equipment cleaning during exercise session. Flowsheet Row Cardiac Rehab from 09/18/2022 in [facility] Cardiac and Pulmonary Rehab  Date 09/18/22  Educator [facility]  Instruction Review Code 1- Verbalizes Understanding       Falls Prevention: - Provides verbal and written material to individual with discussion of falls prevention and safety. Flowsheet Row Cardiac Rehab from 09/18/2022 in [facility] Cardiac and Pulmonary Rehab  Date 09/18/22  Educator [facility]  Instruction Review Code 1- Verbalizes Understanding       Other: -Provides group and verbal instruction on various topics (see comments)   Knowledge Questionnaire Score:  Knowledge Questionnaire Score - 09/18/22 1658       Knowledge Questionnaire Score   Pre Score 23/26             Core Components/Risk Factors/Patient Goals at Admission:  Personal Goals and Risk Factors at Admission - 09/18/22 1657        Core Components/Risk Factors/Patient Goals on Admission    Weight Management Yes;Weight Gain    Intervention Weight Management: Develop a combined nutrition and exercise program designed to reach desired caloric intake, while maintaining appropriate intake of nutrient and fiber, sodium and fats, and appropriate energy expenditure required for the weight goal.;Weight Management: Provide education and appropriate resources to help participant work on and attain dietary goals.;Weight Management/Obesity: Establish reasonable short term and long term weight goals.    Admit Weight 116 lb 11.2 oz (52.9 kg)    Goal Weight: Short Term 120 lb (54.4 kg)    Goal Weight: Long Term 120 lb (54.4 kg)    Expected Outcomes Short Term: Continue to assess and modify interventions until short term weight is achieved;Long Term: Adherence to nutrition and physical activity/exercise program aimed toward attainment of established weight goal;Weight Maintenance: Understanding of the daily nutrition guidelines, which includes 25-35% calories from fat, 7% or less cal from saturated fats, less than 200mg  cholesterol, less than 1.5gm of sodium, & 5 or more servings of fruits and vegetables daily;Understanding recommendations for meals to include 15-35% energy as protein, 25-35% energy from fat, 35-60% energy from carbohydrates, less than 200mg  of dietary cholesterol, 20-35 gm of total fiber daily;Understanding of distribution of calorie intake throughout the day with the consumption of 4-5 meals/snacks;Weight Gain: Understanding of general recommendations for a high calorie,  high protein meal plan that promotes weight gain by distributing calorie intake throughout the day with the consumption for 4-5 meals, snacks, and/or supplements    Hypertension Yes    Intervention Provide education on lifestyle modifcations including regular physical activity/exercise, weight management, moderate sodium restriction and increased consumption  of fresh fruit, vegetables, and low fat dairy, alcohol moderation, and smoking cessation.;Monitor prescription use compliance.    Expected Outcomes Short Term: Continued assessment and intervention until BP is < 140/65mm HG in hypertensive participants. < 130/65mm HG in hypertensive participants with diabetes, heart failure or chronic kidney disease.;Long Term: Maintenance of blood pressure at goal levels.    Lipids Yes    Intervention Provide education and support for participant on nutrition & aerobic/resistive exercise along with prescribed medications to achieve LDL 70mg , HDL >40mg .    Expected Outcomes Short Term: Participant states understanding of desired cholesterol values and is compliant with medications prescribed. Participant is following exercise prescription and nutrition guidelines.;Long Term: Cholesterol controlled with medications as prescribed, with individualized exercise RX and with personalized nutrition plan. Value goals: LDL < 70mg , HDL > 40 mg.             Education:Diabetes - Individual verbal and written instruction to review signs/symptoms of diabetes, desired ranges of glucose level fasting, after meals and with exercise. Acknowledge that pre and post exercise glucose checks will be done for 3 sessions at entry of program.   Core Components/Risk Factors/Patient Goals Review:    Core Components/Risk Factors/Patient Goals at Discharge (Final Review):    ITP Comments:  ITP Comments     Row Name 09/11/22 1024 09/18/22 1650         ITP Comments Virtual Visit completed. Patient informed on EP and RD appointment and 6 Minute walk test. Patient also informed of patient health questionnaires on My Chart. Patient Verbalizes understanding. Visit diagnosis can be found in [facility] 08/13/[facility]22. Completed and gym orientation. Initial ITP created and sent for review to Dr. Bethann Punches, Medical Director.               Comments: initial ITP

## 2022-09-20 ENCOUNTER — Encounter: Payer: Self-pay | Admitting: *Deleted

## 2022-09-20 DIAGNOSIS — Z955 Presence of coronary angioplasty implant and graft: Secondary | ICD-10-CM

## 2022-09-20 DIAGNOSIS — I213 ST elevation (STEMI) myocardial infarction of unspecified site: Secondary | ICD-10-CM

## 2022-09-20 NOTE — Progress Notes (Signed)
Cardiac Individual Treatment Plan  Patient Details  Name: Stacey Francis MRN: [id number] Date of Birth: [birth year] Referring Provider:   Flowsheet Row Cardiac Rehab from 09/18/2022 in [facility] Cardiac and Pulmonary Rehab  Referring Provider callwood       Initial Encounter Date:  Flowsheet Row Cardiac Rehab from 09/18/2022 in [facility] Cardiac and Pulmonary Rehab  Date 09/18/22       Visit Diagnosis: ST elevation myocardial infarction (STEMI), unspecified artery  Status post coronary artery stent placement  Patient's Home Medications on Admission:  Current Outpatient Medications:    aspirin 81 MG chewable tablet, Chew 1 tablet (81 mg total) by mouth daily. (Patient not taking: Reported on 09/11/2022), Disp: 90 tablet, Rfl: 3   atorvastatin (LIPITOR) 80 MG tablet, Take 1 tablet (80 mg total) by mouth daily., Disp: 30 tablet, Rfl: 11   cetirizine (ZYRTEC) 10 MG tablet, Take by mouth., Disp: , Rfl:    clopidogrel (PLAVIX) 75 MG tablet, Take by mouth., Disp: , Rfl:    levothyroxine (SYNTHROID) 25 MCG tablet, Take 25 mcg by mouth daily before breakfast., Disp: , Rfl:    levothyroxine (SYNTHROID) 25 MCG tablet, Take by mouth. (Patient not taking: Reported on 09/11/2022), Disp: , Rfl:    lisinopril (ZESTRIL) 2.5 MG tablet, Take 1 tablet (2.5 mg total) by mouth daily., Disp: 30 tablet, Rfl: 2   metoprolol succinate (TOPROL-XL) 25 MG 24 hr tablet, Take by mouth., Disp: , Rfl:    metoprolol tartrate (LOPRESSOR) 25 MG tablet, Take 0.5 tablets (12.5 mg total) by mouth 2 (two) times daily. (Patient not taking: Reported on 09/11/2022), Disp: 30 tablet, Rfl: 5   metoprolol tartrate (LOPRESSOR) 25 MG tablet, Take by mouth. (Patient not taking: Reported on 09/11/2022), Disp: , Rfl:    naproxen (NAPROSYN) 500 MG tablet, Take 500 mg by mouth 2 (two) times daily., Disp: , Rfl:    nitroGLYCERIN (NITROSTAT) 0.4 MG SL tablet, Place 1 tablet (0.4 mg total) under the tongue every 5 (five) minutes as needed for chest pain.,  Disp: 30 tablet, Rfl: 0   ticagrelor (BRILINTA) 90 MG TABS tablet, Take 1 tablet (90 mg total) by mouth 2 (two) times daily. (Patient not taking: Reported on 09/11/2022), Disp: 180 tablet, Rfl: 3  Past Medical History: Past Medical History:  Diagnosis Date   Hypertension    Sinus infection     Tobacco Use: Social History   Tobacco Use  Smoking Status Never  Smokeless Tobacco Never    Labs: Review Flowsheet       Latest Ref Rng & Units 08/13/2022 08/14/2022  Labs for ITP Cardiac and Pulmonary Rehab  Cholestrol 0 - 200 mg/dL 409  -  LDL (calc) 0 - 99 mg/dL 811  -  HDL-C >91 mg/dL 84  -  Trlycerides <478 mg/dL 70  -  Hemoglobin G9F 4.8 - 5.6 % - 5.7      Exercise Target Goals: Exercise Program Goal: Individual exercise prescription set using results from initial 6 min walk test and THRR while considering  patient's activity barriers and safety.   Exercise Prescription Goal: Initial exercise prescription builds to 30-45 minutes a day of aerobic activity, 2-3 days per week.  Home exercise guidelines will be given to patient during program as part of exercise prescription that the participant will acknowledge.   Education: Aerobic Exercise: - Group verbal and visual presentation on the components of exercise prescription. Introduces F.I.T.T principle from ACSM for exercise prescriptions.  Reviews F.I.T.T. principles of aerobic exercise including progression.  Written material given at graduation. Flowsheet Row Cardiac Rehab from 09/18/2022 in [facility] Cardiac and Pulmonary Rehab  Education need identified 09/18/22       Education: Resistance Exercise: - Group verbal and visual presentation on the components of exercise prescription. Introduces F.I.T.T principle from ACSM for exercise prescriptions  Reviews F.I.T.T. principles of resistance exercise including progression. Written material given at graduation.    Education: Exercise & Equipment Safety: - Individual verbal  instruction and demonstration of equipment use and safety with use of the equipment. Flowsheet Row Cardiac Rehab from 09/18/2022 in [facility] Cardiac and Pulmonary Rehab  Date 09/18/22  Educator [facility]  Instruction Review Code 1- Verbalizes Understanding       Education: Exercise Physiology & General Exercise Guidelines: - Group verbal and written instruction with models to review the exercise physiology of the cardiovascular system and associated critical values. Provides general exercise guidelines with specific guidelines to those with heart or lung disease.  Flowsheet Row Cardiac Rehab from 09/18/2022 in [facility] Cardiac and Pulmonary Rehab  Education need identified 09/18/22       Education: Flexibility, Balance, Mind/Body Relaxation: - Group verbal and visual presentation with interactive activity on the components of exercise prescription. Introduces F.I.T.T principle from ACSM for exercise prescriptions. Reviews F.I.T.T. principles of flexibility and balance exercise training including progression. Also discusses the mind body connection.  Reviews various relaxation techniques to help reduce and manage stress (i.e. Deep breathing, progressive muscle relaxation, and visualization). Balance handout provided to take home. Written material given at graduation.   Activity Barriers & Risk Stratification:  Activity Barriers & Cardiac Risk Stratification - 09/18/22 1652       Activity Barriers & Cardiac Risk Stratification   Activity Barriers None    Cardiac Risk Stratification High             6 Minute Walk:  6 Minute Walk     Row Name 09/18/22 1651         6 Minute Walk   Phase Initial     Distance 1135 feet     Walk Time 6 minutes     # of Rest Breaks 0     MPH 2.15     METS 3.18     RPE 9     Perceived Dyspnea  0     VO2 Peak 11.13     Symptoms No     Resting HR 83 bpm     Resting BP 132/82     Resting Oxygen Saturation  99 %     Exercise Oxygen Saturation  during 6 min  walk 97 %     Max Ex. HR 111 bpm     Max Ex. BP 130/82     2 Minute Post BP 118/62              Oxygen Initial Assessment:   Oxygen Re-Evaluation:   Oxygen Discharge (Final Oxygen Re-Evaluation):   Initial Exercise Prescription:  Initial Exercise Prescription - 09/18/22 1600       Date of Initial Exercise RX and Referring Provider   Date 09/18/22    Referring Provider callwood      Oxygen   Maintain Oxygen Saturation 88% or higher      Treadmill   MPH 2    Grade 1    Minutes 15    METs 2.81      Recumbant Bike   Level 2    RPM 50    Watts 20  Minutes 15    METs 3.18      NuStep   Level 2    SPM 80    Minutes 15    METs 3.18      REL-XR   Level 2    Speed 50    Minutes 15    METs 3.18      T5 Nustep   Level 1    SPM 80    Minutes 15    METs 3.18      Prescription Details   Frequency (times per week) 3    Duration Progress to 30 minutes of continuous aerobic without signs/symptoms of physical distress      Intensity   THRR 40-80% of Max Heartrate 111-139    Ratings of Perceived Exertion 11-13    Perceived Dyspnea 0-4      Progression   Progression Continue to progress workloads to maintain intensity without signs/symptoms of physical distress.      Resistance Training   Training Prescription Yes    Weight 3    Reps 10-15             Perform Capillary Blood Glucose checks as needed.  Exercise Prescription Changes:   Exercise Prescription Changes     Row Name 09/18/22 1600             Response to Exercise   Blood Pressure (Admit) 132/82       Blood Pressure (Exercise) 130/82       Blood Pressure (Exit) 118/62       Heart Rate (Admit) 83 bpm       Heart Rate (Exercise) 111 bpm       Heart Rate (Exit) 80 bpm       Oxygen Saturation (Admit) 99 %       Oxygen Saturation (Exercise) 97 %       Oxygen Saturation (Exit) 100 %       Rating of Perceived Exertion (Exercise) 9       Perceived Dyspnea (Exercise) 0        Symptoms none       Comments 6 MWT results                Exercise Comments:   Exercise Goals and Review:   Exercise Goals     Row Name 09/18/22 1655             Exercise Goals   Increase Physical Activity Yes       Intervention Provide advice, education, support and counseling about physical activity/exercise needs.;Develop an individualized exercise prescription for aerobic and resistive training based on initial evaluation findings, risk stratification, comorbidities and participant's personal goals.       Expected Outcomes Short Term: Attend rehab on a regular basis to increase amount of physical activity.;Long Term: Add in home exercise to make exercise part of routine and to increase amount of physical activity.;Long Term: Exercising regularly at least 3-5 days a week.       Increase Strength and Stamina Yes       Intervention Provide advice, education, support and counseling about physical activity/exercise needs.;Develop an individualized exercise prescription for aerobic and resistive training based on initial evaluation findings, risk stratification, comorbidities and participant's personal goals.       Expected Outcomes Short Term: Increase workloads from initial exercise prescription for resistance, speed, and METs.;Short Term: Perform resistance training exercises routinely during rehab and add in resistance training at home;Long Term: Improve cardiorespiratory fitness, muscular  endurance and strength as measured by increased METs and functional capacity ( )       Able to understand and use rate of perceived exertion (RPE) scale Yes       Intervention Provide education and explanation on how to use RPE scale       Expected Outcomes Short Term: Able to use RPE daily in rehab to express subjective intensity level;Long Term:  Able to use RPE to guide intensity level when exercising independently       Able to understand and use Dyspnea scale Yes       Intervention  Provide education and explanation on how to use Dyspnea scale       Expected Outcomes Short Term: Able to use Dyspnea scale daily in rehab to express subjective sense of shortness of breath during exertion;Long Term: Able to use Dyspnea scale to guide intensity level when exercising independently       Knowledge and understanding of Target Heart Rate Range (THRR) Yes       Intervention Provide education and explanation of THRR including how the numbers were predicted and where they are located for reference       Expected Outcomes Short Term: Able to state/look up THRR;Long Term: Able to use THRR to govern intensity when exercising independently;Short Term: Able to use daily as guideline for intensity in rehab       Able to check pulse independently Yes       Intervention Provide education and demonstration on how to check pulse in carotid and radial arteries.;Review the importance of being able to check your own pulse for safety during independent exercise       Expected Outcomes Short Term: Able to explain why pulse checking is important during independent exercise       Understanding of Exercise Prescription Yes       Intervention Provide education, explanation, and written materials on patient's individual exercise prescription       Expected Outcomes Short Term: Able to explain program exercise prescription;Long Term: Able to explain home exercise prescription to exercise independently                Exercise Goals Re-Evaluation :   Discharge Exercise Prescription (Final Exercise Prescription Changes):  Exercise Prescription Changes - 09/18/22 1600       Response to Exercise   Blood Pressure (Admit) 132/82    Blood Pressure (Exercise) 130/82    Blood Pressure (Exit) 118/62    Heart Rate (Admit) 83 bpm    Heart Rate (Exercise) 111 bpm    Heart Rate (Exit) 80 bpm    Oxygen Saturation (Admit) 99 %    Oxygen Saturation (Exercise) 97 %    Oxygen Saturation (Exit) 100 %    Rating of  Perceived Exertion (Exercise) 9    Perceived Dyspnea (Exercise) 0    Symptoms none    Comments 6 MWT results             Nutrition:  Target Goals: Understanding of nutrition guidelines, daily intake of sodium 1500mg , cholesterol 200mg , calories 30% from fat and 7% or less from saturated fats, daily to have 5 or more servings of fruits and vegetables.  Education: All About Nutrition: -Group instruction provided by verbal, written material, interactive activities, discussions, models, and posters to present general guidelines for heart healthy nutrition including fat, fiber, MyPlate, the role of sodium in heart healthy nutrition, utilization of the nutrition label, and utilization of this knowledge for meal planning. Follow  up email sent as well. Written material given at graduation. Flowsheet Row Cardiac Rehab from 09/18/2022 in [facility] Cardiac and Pulmonary Rehab  Education need identified 09/18/22       Biometrics:  Pre Biometrics - 09/18/22 1656       Pre Biometrics   Height 5' 3.5" (1.613 m)    Weight 116 lb 11.2 oz (52.9 kg)    Waist Circumference 28 inches    Hip Circumference 36.5 inches    Waist to Hip Ratio 0.77 %    BMI (Calculated) 20.35    Single Leg Stand 30 seconds              Nutrition Therapy Plan and Nutrition Goals:  Nutrition Therapy & Goals - 09/18/22 1657       Intervention Plan   Intervention Prescribe, educate and counsel regarding individualized specific dietary modifications aiming towards targeted core components such as weight, hypertension, lipid management, diabetes, heart failure and other comorbidities.    Expected Outcomes Short Term Goal: Understand basic principles of dietary content, such as calories, fat, sodium, cholesterol and nutrients.;Short Term Goal: A plan has been developed with personal nutrition goals set during dietitian appointment.;Long Term Goal: Adherence to prescribed nutrition plan.             Nutrition  Assessments:  MEDIFICTS Score Key: ?70 Need to make dietary changes  40-70 Heart Healthy Diet ? 40 Therapeutic Level Cholesterol Diet  Flowsheet Row Cardiac Rehab from 09/18/2022 in [facility] Cardiac and Pulmonary Rehab  Picture Your Plate Total Score on Admission 82      Picture Your Plate Scores: <16 Unhealthy dietary pattern with much room for improvement. 41-50 Dietary pattern unlikely to meet recommendations for good health and room for improvement. 51-60 More healthful dietary pattern, with some room for improvement.  >60 Healthy dietary pattern, although there may be some specific behaviors that could be improved.    Nutrition Goals Re-Evaluation:   Nutrition Goals Discharge (Final Nutrition Goals Re-Evaluation):   Psychosocial: Target Goals: Acknowledge presence or absence of significant depression and/or stress, maximize coping skills, provide positive support system. Participant is able to verbalize types and ability to use techniques and skills needed for reducing stress and depression.   Education: Stress, Anxiety, and Depression - Group verbal and visual presentation to define topics covered.  Reviews how body is impacted by stress, anxiety, and depression.  Also discusses healthy ways to reduce stress and to treat/manage anxiety and depression.  Written material given at graduation.   Education: Sleep Hygiene -Provides group verbal and written instruction about how sleep can affect your health.  Define sleep hygiene, discuss sleep cycles and impact of sleep habits. Review good sleep hygiene tips.    Initial Review & Psychosocial Screening:  Initial Psych Review & Screening - 09/11/22 1025       Initial Review   Current issues with Current Stress Concerns    Source of Stress Concerns Chronic Illness    Comments She has been more frustrated with her limitations and her health. She is not able to do the things that she wants to do. For the past two years she has been  trying to find out what is wrong with her and did not find it until now. Her doctor told her she still has a blockage and cant to anything about it now.      Family Dynamics   Good Support System? Yes    Comments Aviannah can look to her four kids for  support and are helpful.             Quality of Life Scores:   Quality of Life - 09/18/22 1658       Quality of Life   Select Quality of Life      Quality of Life Scores   Health/Function Pre 14.07 %    Socioeconomic Pre 26.08 %    Psych/Spiritual Pre 25 %    Family Pre 28.5 %    GLOBAL Pre 20.58 %            Scores of 19 and below usually indicate a poorer quality of life in these areas.  A difference of  2-3 points is a clinically meaningful difference.  A difference of 2-3 points in the total score of the Quality of Life Index has been associated with significant improvement in overall quality of life, self-image, physical symptoms, and general health in studies assessing change in quality of life.  PHQ-9: Review Flowsheet       09/18/2022  Depression screen PHQ 2/9  Decreased Interest 0  Down, Depressed, Hopeless 0  PHQ - 2 Score 0  Altered sleeping 0  Tired, decreased energy 1  Change in appetite 0  Feeling bad or failure about yourself  0  Trouble concentrating 0  Moving slowly or fidgety/restless 0  Suicidal thoughts 0  PHQ-9 Score 1  Difficult doing work/chores Not difficult at all   Interpretation of Total Score  Total Score Depression Severity:  1-4 = Minimal depression, 5-9 = Mild depression, 10-14 = Moderate depression, 15-19 = Moderately severe depression, 20-27 = Severe depression   Psychosocial Evaluation and Intervention:  Psychosocial Evaluation - 09/11/22 1032       Psychosocial Evaluation & Interventions   Interventions Encouraged to exercise with the program and follow exercise prescription;Relaxation education;Stress management education    Comments She has been more frustrated with her  limitations and her health. She is not able to do the things that she wants to do. For the past two years she has been trying to find out what is wrong with her and did not find it until now. Her doctor told her she still has a blockage and cant to anything about it now.Rondia can look to her four kids for support and are helpful.    Expected Outcomes Short: Start [facility] to help with mood. Long: Maintain a healthy mental state    Continue Psychosocial Services  Follow up required by staff             Psychosocial Re-Evaluation:   Psychosocial Discharge (Final Psychosocial Re-Evaluation):   Vocational Rehabilitation: Provide vocational rehab assistance to qualifying candidates.   Vocational Rehab Evaluation & Intervention:   Education: Education Goals: Education classes will be provided on a variety of topics geared toward better understanding of heart health and risk factor modification. Participant will state understanding/return demonstration of topics presented as noted by education test scores.  Learning Barriers/Preferences:  Learning Barriers/Preferences - 09/11/22 1024       Learning Barriers/Preferences   Learning Barriers None    Learning Preferences None             General Cardiac Education Topics:  AED/CPR: - Group verbal and written instruction with the use of models to demonstrate the basic use of the AED with the basic ABC's of resuscitation.   Anatomy and Cardiac Procedures: - Group verbal and visual presentation and models provide information about basic cardiac anatomy and function. Reviews the  testing methods done to diagnose heart disease and the outcomes of the test results. Describes the treatment choices: Medical Management, Angioplasty, or Coronary Bypass Surgery for treating various heart conditions including Myocardial Infarction, Angina, Valve Disease, and Cardiac Arrhythmias.  Written material given at graduation.   Medication  Safety: - Group verbal and visual instruction to review commonly prescribed medications for heart and lung disease. Reviews the medication, class of the drug, and side effects. Includes the steps to properly store meds and maintain the prescription regimen.  Written material given at graduation.   Intimacy: - Group verbal instruction through game format to discuss how heart and lung disease can affect sexual intimacy. Written material given at graduation..   Know Your Numbers and Heart Failure: - Group verbal and visual instruction to discuss disease risk factors for cardiac and pulmonary disease and treatment options.  Reviews associated critical values for Overweight/Obesity, Hypertension, Cholesterol, and Diabetes.  Discusses basics of heart failure: signs/symptoms and treatments.  Introduces Heart Failure Zone chart for action plan for heart failure.  Written material given at graduation.   Infection Prevention: - Provides verbal and written material to individual with discussion of infection control including proper hand washing and proper equipment cleaning during exercise session. Flowsheet Row Cardiac Rehab from 09/18/2022 in [facility] Cardiac and Pulmonary Rehab  Date 09/18/22  Educator [facility]  Instruction Review Code 1- Verbalizes Understanding       Falls Prevention: - Provides verbal and written material to individual with discussion of falls prevention and safety. Flowsheet Row Cardiac Rehab from 09/18/2022 in [facility] Cardiac and Pulmonary Rehab  Date 09/18/22  Educator [facility]  Instruction Review Code 1- Verbalizes Understanding       Other: -Provides group and verbal instruction on various topics (see comments)   Knowledge Questionnaire Score:  Knowledge Questionnaire Score - 09/18/22 1658       Knowledge Questionnaire Score   Pre Score 23/26             Core Components/Risk Factors/Patient Goals at Admission:  Personal Goals and Risk Factors at Admission - 09/18/22 1657        Core Components/Risk Factors/Patient Goals on Admission    Weight Management Yes;Weight Gain    Intervention Weight Management: Develop a combined nutrition and exercise program designed to reach desired caloric intake, while maintaining appropriate intake of nutrient and fiber, sodium and fats, and appropriate energy expenditure required for the weight goal.;Weight Management: Provide education and appropriate resources to help participant work on and attain dietary goals.;Weight Management/Obesity: Establish reasonable short term and long term weight goals.    Admit Weight 116 lb 11.2 oz (52.9 kg)    Goal Weight: Short Term 120 lb (54.4 kg)    Goal Weight: Long Term 120 lb (54.4 kg)    Expected Outcomes Short Term: Continue to assess and modify interventions until short term weight is achieved;Long Term: Adherence to nutrition and physical activity/exercise program aimed toward attainment of established weight goal;Weight Maintenance: Understanding of the daily nutrition guidelines, which includes 25-35% calories from fat, 7% or less cal from saturated fats, less than 200mg  cholesterol, less than 1.5gm of sodium, & 5 or more servings of fruits and vegetables daily;Understanding recommendations for meals to include 15-35% energy as protein, 25-35% energy from fat, 35-60% energy from carbohydrates, less than 200mg  of dietary cholesterol, 20-35 gm of total fiber daily;Understanding of distribution of calorie intake throughout the day with the consumption of 4-5 meals/snacks;Weight Gain: Understanding of general recommendations for a high calorie,  high protein meal plan that promotes weight gain by distributing calorie intake throughout the day with the consumption for 4-5 meals, snacks, and/or supplements    Hypertension Yes    Intervention Provide education on lifestyle modifcations including regular physical activity/exercise, weight management, moderate sodium restriction and increased consumption  of fresh fruit, vegetables, and low fat dairy, alcohol moderation, and smoking cessation.;Monitor prescription use compliance.    Expected Outcomes Short Term: Continued assessment and intervention until BP is < 140/40mm HG in hypertensive participants. < 130/43mm HG in hypertensive participants with diabetes, heart failure or chronic kidney disease.;Long Term: Maintenance of blood pressure at goal levels.    Lipids Yes    Intervention Provide education and support for participant on nutrition & aerobic/resistive exercise along with prescribed medications to achieve LDL 70mg , HDL >40mg .    Expected Outcomes Short Term: Participant states understanding of desired cholesterol values and is compliant with medications prescribed. Participant is following exercise prescription and nutrition guidelines.;Long Term: Cholesterol controlled with medications as prescribed, with individualized exercise RX and with personalized nutrition plan. Value goals: LDL < 70mg , HDL > 40 mg.             Education:Diabetes - Individual verbal and written instruction to review signs/symptoms of diabetes, desired ranges of glucose level fasting, after meals and with exercise. Acknowledge that pre and post exercise glucose checks will be done for 3 sessions at entry of program.   Core Components/Risk Factors/Patient Goals Review:    Core Components/Risk Factors/Patient Goals at Discharge (Final Review):    ITP Comments:  ITP Comments     Row Name 09/11/22 1024 09/18/22 1650 09/20/22 1424       ITP Comments Virtual Visit completed. Patient informed on EP and RD appointment and 6 Minute walk test. Patient also informed of patient health questionnaires on My Chart. Patient Verbalizes understanding. Visit diagnosis can be found in [facility] 08/13/2022. Completed and gym orientation. Initial ITP created and sent for review to Dr. Bethann Punches, Medical Director. 30 day review completed. ITP sent to Dr. Bethann Punches, Medical  Director of Cardiac Rehab. Continue with ITP unless changes are made by physician.   Pt new to program.              Comments: 30 day review

## 2022-09-25 ENCOUNTER — Encounter: Payer: No Typology Code available for payment source | Admitting: *Deleted

## 2022-09-25 DIAGNOSIS — I213 ST elevation (STEMI) myocardial infarction of unspecified site: Secondary | ICD-10-CM

## 2022-09-25 DIAGNOSIS — Z955 Presence of coronary angioplasty implant and graft: Secondary | ICD-10-CM

## 2022-09-25 NOTE — Progress Notes (Signed)
Daily Session Note  Patient Details  Name: Stacey Francis MRN: [id number] Date of Birth: [birth year] Referring Provider:   Flowsheet Row Cardiac Rehab from 09/18/2022 in [facility] Cardiac and Pulmonary Rehab  Referring Provider callwood       Encounter Date: 09/25/2022  Check In:  Session Check In - 09/25/22 1559       Check-In   Supervising physician immediately available to respond to emergencies See telemetry face sheet for immediately available ER MD    Location [facility]-Cardiac & Pulmonary Rehab    Staff Present Susann Givens, RN BSN;Joseph Reino Kent, RCP,RRT,BSRT;Noah Damon, [place], Exercise Physiologist    Virtual Visit No    Medication changes reported     No    Fall or balance concerns reported    No    Warm-up and Cool-down Performed on first and last piece of equipment    Resistance Training Performed Yes    VAD Patient? No    PAD/SET Patient? No      Pain Assessment   Currently in Pain? No/denies                Social History   Tobacco Use  Smoking Status Never  Smokeless Tobacco Never    Goals Met:  Independence with exercise equipment Exercise tolerated well No report of concerns or symptoms today Strength training completed today  Goals Unmet:  Not Applicable  Comments: First full day of exercise!  Patient was oriented to gym and equipment including functions, settings, policies, and procedures.  Patient's individual exercise prescription and treatment plan were reviewed.  All starting workloads were established based on the results of the 6 minute walk test done at initial orientation visit.  The plan for exercise progression was also introduced and progression will be customized based on patient's performance and goals.    Dr. Bethann Punches is Medical Director for [facility] Cardiac Rehabilitation.  Dr. Vida Rigger is Medical Director for [facility] Pulmonary Rehabilitation.

## 2022-10-02 DIAGNOSIS — I2111 ST elevation (STEMI) myocardial infarction involving right coronary artery: Secondary | ICD-10-CM | POA: Diagnosis not present

## 2022-10-02 DIAGNOSIS — E7841 Elevated Lipoprotein(a): Secondary | ICD-10-CM | POA: Diagnosis not present

## 2022-10-02 DIAGNOSIS — I6789 Other cerebrovascular disease: Secondary | ICD-10-CM | POA: Diagnosis not present

## 2022-10-02 DIAGNOSIS — E785 Hyperlipidemia, unspecified: Secondary | ICD-10-CM | POA: Diagnosis not present

## 2022-10-04 ENCOUNTER — Encounter: Payer: No Typology Code available for payment source | Attending: Internal Medicine | Admitting: *Deleted

## 2022-10-04 ENCOUNTER — Other Ambulatory Visit (INDEPENDENT_AMBULATORY_CARE_PROVIDER_SITE_OTHER): Payer: Self-pay | Admitting: Vascular Surgery

## 2022-10-04 DIAGNOSIS — I213 ST elevation (STEMI) myocardial infarction of unspecified site: Secondary | ICD-10-CM | POA: Insufficient documentation

## 2022-10-04 DIAGNOSIS — R0989 Other specified symptoms and signs involving the circulatory and respiratory systems: Secondary | ICD-10-CM

## 2022-10-04 DIAGNOSIS — Z955 Presence of coronary angioplasty implant and graft: Secondary | ICD-10-CM | POA: Insufficient documentation

## 2022-10-04 DIAGNOSIS — M79601 Pain in right arm: Secondary | ICD-10-CM

## 2022-10-04 NOTE — Progress Notes (Signed)
Daily Session Note  Patient Details  Name: HEYDY MONTILLA MRN: [id number] Date of Birth: 31-Aug-1955 Referring Provider:   Flowsheet Row Cardiac Rehab from 09/18/2022 in [facility] Cardiac and Pulmonary Rehab  Referring Provider callwood       Encounter Date: 10/04/2022  Check In:  Session Check In - 10/04/22 1556       Check-In   Supervising physician immediately available to respond to emergencies See telemetry face sheet for immediately available ER MD    Location [facility]-Cardiac & Pulmonary Rehab    Staff Present Hulen Luster, BS, RRT, CPFT;Kelly Madilyn Fireman, BS, ACSM CEP, Exercise Physiologist;Annalise Mcdiarmid Jewel Baize, RN BSN    Virtual Visit No    Medication changes reported     No    Fall or balance concerns reported    No    Warm-up and Cool-down Performed on first and last piece of equipment    Resistance Training Performed Yes    VAD Patient? No    PAD/SET Patient? No      Pain Assessment   Currently in Pain? No/denies                Social History   Tobacco Use  Smoking Status Never  Smokeless Tobacco Never    Goals Met:  Independence with exercise equipment Exercise tolerated well No report of concerns or symptoms today Strength training completed today  Goals Unmet:  Not Applicable  Comments: Pt able to follow exercise prescription today without complaint.  Will continue to monitor for progression.    Dr. Bethann Punches is Medical Director for [facility] Cardiac Rehabilitation.  Dr. Vida Rigger is Medical Director for [facility] Pulmonary Rehabilitation.

## 2022-10-05 DIAGNOSIS — I252 Old myocardial infarction: Secondary | ICD-10-CM | POA: Diagnosis not present

## 2022-10-05 DIAGNOSIS — R0602 Shortness of breath: Secondary | ICD-10-CM | POA: Diagnosis not present

## 2022-10-05 DIAGNOSIS — I2089 Other forms of angina pectoris: Secondary | ICD-10-CM | POA: Diagnosis not present

## 2022-10-08 DIAGNOSIS — I2510 Atherosclerotic heart disease of native coronary artery without angina pectoris: Secondary | ICD-10-CM | POA: Insufficient documentation

## 2022-10-08 DIAGNOSIS — M79603 Pain in arm, unspecified: Secondary | ICD-10-CM | POA: Insufficient documentation

## 2022-10-08 DIAGNOSIS — I739 Peripheral vascular disease, unspecified: Secondary | ICD-10-CM | POA: Insufficient documentation

## 2022-10-08 NOTE — Progress Notes (Deleted)
MRN : [id number]  Stacey Francis is a 67 y.o. ([birth year]) female who presents with chief complaint of check circulation.  History of Present Illness: ***  No outpatient medications have been marked as taking for the 10/09/22 encounter (Appointment) with Gilda Crease, Latina Craver, MD.    Past Medical History:  Diagnosis Date   Hypertension    Sinus infection     Past Surgical History:  Procedure Laterality Date   CORONARY/GRAFT ACUTE MI REVASCULARIZATION N/A 08/13/2022   Procedure: Coronary/Graft Acute MI Revascularization;  Surgeon: Alwyn Pea, MD;  Location: [facility] INVASIVE CV LAB;  Service: Cardiovascular;  Laterality: N/A;   ECTOPIC PREGNANCY SURGERY     LEFT HEART CATH AND CORONARY ANGIOGRAPHY N/A 08/13/2022   Procedure: LEFT HEART CATH AND CORONARY ANGIOGRAPHY;  Surgeon: Alwyn Pea, MD;  Location: [facility] INVASIVE CV LAB;  Service: Cardiovascular;  Laterality: N/A;    Social History Social History   Tobacco Use   Smoking status: Never   Smokeless tobacco: Never  Vaping Use   Vaping Use: Never used  Substance Use Topics   Alcohol use: No   Drug use: No    Family History No family history on file.  Allergies  Allergen Reactions   Penicillins Swelling     REVIEW OF SYSTEMS (Negative unless checked)  Constitutional: [] Weight loss  [] Fever  [] Chills Cardiac: [] Chest pain   [] Chest pressure   [] Palpitations   [] Shortness of breath when laying flat   [] Shortness of breath with exertion. Vascular:  [x] Pain in legs with walking   [] Pain in legs at rest  [] History of DVT   [] Phlebitis   [] Swelling in legs   [] Varicose veins   [] Non-healing ulcers Pulmonary:   [] Uses home oxygen   [] Productive cough   [] Hemoptysis   [] Wheeze  [] COPD   [] Asthma Neurologic:  [] Dizziness   [] Seizures   [] History of stroke   [] History of TIA  [] Aphasia   [] Vissual changes   [] Weakness or numbness in arm   [] Weakness or numbness in leg Musculoskeletal:   [] Joint  swelling   [] Joint pain   [] Low back pain Hematologic:  [] Easy bruising  [] Easy bleeding   [] Hypercoagulable state   [] Anemic Gastrointestinal:  [] Diarrhea   [] Vomiting  [] Gastroesophageal reflux/heartburn   [] Difficulty swallowing. Genitourinary:  [] Chronic kidney disease   [] Difficult urination  [] Frequent urination   [] Blood in urine Skin:  [] Rashes   [] Ulcers  Psychological:  [] History of anxiety   []  History of major depression.  Physical Examination  There were no vitals filed for this visit. There is no height or weight on file to calculate BMI. Gen: WD/WN, NAD Head: [place]/AT, No temporalis wasting.  Ear/Nose/Throat: Hearing grossly intact, nares w/o erythema or drainage Eyes: PER, EOMI, sclera nonicteric.  Neck: Supple, no masses.  No bruit or JVD.  Pulmonary:  Good air movement, no audible wheezing, no use of accessory muscles.  Cardiac: RRR, normal S1, S2, no Murmurs. Vascular:  mild trophic changes, no open wounds Vessel Right Left  Radial Palpable Palpable  PT Not Palpable Not Palpable  DP Not Palpable Not Palpable  Gastrointestinal: soft, non-distended. No guarding/no peritoneal signs.  Musculoskeletal: M/S 5/5 throughout.  No visible deformity.  Neurologic: CN 2-12 intact. Pain and light touch intact in extremities.  Symmetrical.  Speech is fluent. Motor exam as listed above. Psychiatric: Judgment intact, Mood & affect appropriate for pt's clinical situation. Dermatologic: No rashes  or ulcers noted.  No changes consistent with cellulitis.   CBC Lab Results  Component Value Date   WBC 5.1 09/04/2022   HGB 11.6 (L) 09/04/2022   HCT 35.2 (L) 09/04/2022   MCV 89.8 09/04/2022   PLT 215 09/04/2022    BMET    Component Value Date/Time   NA 141 09/04/2022 1501   K 4.5 09/04/2022 1501   CL 107 09/04/2022 1501   CO2 24 09/04/2022 1501   GLUCOSE 123 (H) 09/04/2022 1501   BUN 16 09/04/2022 1501   CREATININE 0.93 09/04/2022 1501   CALCIUM 9.3 09/04/2022 1501    GFRNONAA >60 09/04/2022 1501   GFRAA >60 10/14/2019 1437   CrCl cannot be calculated (Patient's most recent lab result is older than the maximum 21 days allowed.).  COAG Lab Results  Component Value Date   INR 1.0 08/13/2022   INR 0.9 11/09/2020   INR 0.9 10/11/2018    Radiology No results found.   Assessment/Plan There are no diagnoses linked to this encounter.   Levora Dredge, MD  10/08/2022 12:35 PM

## 2022-10-09 ENCOUNTER — Other Ambulatory Visit (INDEPENDENT_AMBULATORY_CARE_PROVIDER_SITE_OTHER): Payer: Self-pay

## 2022-10-09 ENCOUNTER — Encounter (INDEPENDENT_AMBULATORY_CARE_PROVIDER_SITE_OTHER): Payer: Self-pay | Admitting: Vascular Surgery

## 2022-10-09 ENCOUNTER — Encounter: Payer: No Typology Code available for payment source | Admitting: *Deleted

## 2022-10-09 DIAGNOSIS — I25119 Atherosclerotic heart disease of native coronary artery with unspecified angina pectoris: Secondary | ICD-10-CM

## 2022-10-09 DIAGNOSIS — E785 Hyperlipidemia, unspecified: Secondary | ICD-10-CM

## 2022-10-09 DIAGNOSIS — Z955 Presence of coronary angioplasty implant and graft: Secondary | ICD-10-CM

## 2022-10-09 DIAGNOSIS — I213 ST elevation (STEMI) myocardial infarction of unspecified site: Secondary | ICD-10-CM

## 2022-10-09 DIAGNOSIS — I739 Peripheral vascular disease, unspecified: Secondary | ICD-10-CM

## 2022-10-09 DIAGNOSIS — I10 Essential (primary) hypertension: Secondary | ICD-10-CM

## 2022-10-09 DIAGNOSIS — M79603 Pain in arm, unspecified: Secondary | ICD-10-CM

## 2022-10-09 NOTE — Progress Notes (Signed)
Daily Session Note  Patient Details  Name: Stacey Francis MRN: [id number] Date of Birth: 1956/03/09 Referring Provider:   Flowsheet Row Cardiac Rehab from 09/18/2022 in [facility] Cardiac and Pulmonary Rehab  Referring Provider callwood       Encounter Date: 10/09/2022  Check In:  Session Check In - 10/09/22 1534       Check-In   Supervising physician immediately available to respond to emergencies See telemetry face sheet for immediately available ER MD    Location [facility]-Cardiac & Pulmonary Rehab    Staff Present Susann Givens, RN BSN;Joseph Reino Kent, RCP,RRT,BSRT;Noah [place], [place], Exercise Physiologist    Virtual Visit No    Medication changes reported     No    Fall or balance concerns reported    No    Warm-up and Cool-down Performed on first and last piece of equipment    Resistance Training Performed Yes    VAD Patient? No    PAD/SET Patient? No      Pain Assessment   Currently in Pain? No/denies                Social History   Tobacco Use  Smoking Status Never  Smokeless Tobacco Never    Goals Met:  Independence with exercise equipment Exercise tolerated well No report of concerns or symptoms today Strength training completed today  Goals Unmet:  Not Applicable  Comments: Pt able to follow exercise prescription today without complaint.  Will continue to monitor for progression.    Dr. Bethann Punches is Medical Director for [facility] Cardiac Rehabilitation.  Dr. Vida Rigger is Medical Director for [facility] Pulmonary Rehabilitation.

## 2022-10-11 ENCOUNTER — Encounter: Payer: No Typology Code available for payment source | Admitting: *Deleted

## 2022-10-11 DIAGNOSIS — Z955 Presence of coronary angioplasty implant and graft: Secondary | ICD-10-CM

## 2022-10-11 DIAGNOSIS — I213 ST elevation (STEMI) myocardial infarction of unspecified site: Secondary | ICD-10-CM | POA: Diagnosis not present

## 2022-10-11 NOTE — Progress Notes (Signed)
Daily Session Note  Patient Details  Name: Stacey Francis MRN: [id number] Date of Birth: 1955/08/23 Referring Provider:   Flowsheet Row Cardiac Rehab from 09/18/2022 in [facility] Cardiac and Pulmonary Rehab  Referring Provider callwood       Encounter Date: 10/11/2022  Check In:  Session Check In - 10/11/22 1541       Check-In   Supervising physician immediately available to respond to emergencies See telemetry face sheet for immediately available ER MD    Location [facility]-Cardiac & Pulmonary Rehab    Staff Present Susann Givens, RN Mabeline Caras, BS, ACSM CEP, Exercise Physiologist;Joseph Reino Kent, [place]    Virtual Visit No    Medication changes reported     No    Fall or balance concerns reported    No    Warm-up and Cool-down Performed on first and last piece of equipment    Resistance Training Performed Yes    VAD Patient? No    PAD/SET Patient? No      Pain Assessment   Currently in Pain? No/denies                Social History   Tobacco Use  Smoking Status Never  Smokeless Tobacco Never    Goals Met:  Independence with exercise equipment Exercise tolerated well No report of concerns or symptoms today Strength training completed today  Goals Unmet:  Not Applicable  Comments: Pt able to follow exercise prescription today without complaint.  Will continue to monitor for progression.    Dr. Bethann Punches is Medical Director for [facility] Cardiac Rehabilitation.  Dr. Vida Rigger is Medical Director for [facility] Pulmonary Rehabilitation.

## 2022-10-16 ENCOUNTER — Encounter: Payer: No Typology Code available for payment source | Admitting: *Deleted

## 2022-10-16 DIAGNOSIS — I213 ST elevation (STEMI) myocardial infarction of unspecified site: Secondary | ICD-10-CM | POA: Diagnosis not present

## 2022-10-16 DIAGNOSIS — Z955 Presence of coronary angioplasty implant and graft: Secondary | ICD-10-CM

## 2022-10-16 NOTE — Progress Notes (Signed)
Daily Session Note  Patient Details  Name: MIGNON HUTTO MRN: [id number] Date of Birth: 1955/11/01 Referring Provider:   Flowsheet Row Cardiac Rehab from 09/18/2022 in [facility] Cardiac and Pulmonary Rehab  Referring Provider callwood       Encounter Date: 10/16/2022  Check In:  Session Check In - 10/16/22 1545       Check-In   Supervising physician immediately available to respond to emergencies See telemetry face sheet for immediately available ER MD    Location [facility]-Cardiac & Pulmonary Rehab    Staff Present Susann Givens, RN BSN;Joseph Reino Kent, RCP,RRT,BSRT;Noah [place], [place], Exercise Physiologist    Virtual Visit No    Medication changes reported     No    Fall or balance concerns reported    No    Warm-up and Cool-down Performed on first and last piece of equipment    Resistance Training Performed Yes    VAD Patient? No    PAD/SET Patient? No      Pain Assessment   Currently in Pain? No/denies                Social History   Tobacco Use  Smoking Status Never  Smokeless Tobacco Never    Goals Met:  Independence with exercise equipment Exercise tolerated well No report of concerns or symptoms today Strength training completed today  Goals Unmet:  Not Applicable  Comments: Pt able to follow exercise prescription today without complaint.  Will continue to monitor for progression.    Dr. Bethann Punches is Medical Director for [facility] Cardiac Rehabilitation.  Dr. Vida Rigger is Medical Director for [facility] Pulmonary Rehabilitation.

## 2022-10-18 ENCOUNTER — Encounter: Payer: No Typology Code available for payment source | Admitting: *Deleted

## 2022-10-18 ENCOUNTER — Encounter: Payer: Self-pay | Admitting: *Deleted

## 2022-10-18 DIAGNOSIS — Z955 Presence of coronary angioplasty implant and graft: Secondary | ICD-10-CM

## 2022-10-18 DIAGNOSIS — I213 ST elevation (STEMI) myocardial infarction of unspecified site: Secondary | ICD-10-CM

## 2022-10-18 NOTE — Progress Notes (Signed)
Daily Session Note  Patient Details  Name: Stacey Francis MRN: [id number] Date of Birth: 04/28/1956 Referring Provider:   Flowsheet Row Cardiac Rehab from 09/18/2022 in [facility] Cardiac and Pulmonary Rehab  Referring Provider callwood       Encounter Date: 10/18/2022  Check In:  Session Check In - 10/18/22 1542       Check-In   Supervising physician immediately available to respond to emergencies See telemetry face sheet for immediately available ER MD    Location [facility]-Cardiac & Pulmonary Rehab    Staff Present Susann Givens, RN Atilano Median, RN, ADN;Laureen Manson Passey, BS, RRT, CPFT    Virtual Visit No    Medication changes reported     No    Fall or balance concerns reported    No    Warm-up and Cool-down Performed on first and last piece of equipment    Resistance Training Performed Yes    VAD Patient? No    PAD/SET Patient? No      Pain Assessment   Currently in Pain? No/denies                Social History   Tobacco Use  Smoking Status Never  Smokeless Tobacco Never    Goals Met:  Independence with exercise equipment Exercise tolerated well No report of concerns or symptoms today Strength training completed today  Goals Unmet:  Not Applicable  Comments: Pt able to follow exercise prescription today without complaint.  Will continue to monitor for progression.    Dr. Bethann Punches is Medical Director for [facility] Cardiac Rehabilitation.  Dr. Vida Rigger is Medical Director for [facility] Pulmonary Rehabilitation.

## 2022-10-18 NOTE — Progress Notes (Signed)
Cardiac Individual Treatment Plan  Patient Details  Name: Stacey Francis MRN: [id number] Date of Birth: 1955-12-28 Referring Provider:   Flowsheet Row Cardiac Rehab from 09/18/2022 in [facility] Cardiac and Pulmonary Rehab  Referring Provider callwood       Initial Encounter Date:  Flowsheet Row Cardiac Rehab from 09/18/2022 in [facility] Cardiac and Pulmonary Rehab  Date 09/18/22       Visit Diagnosis: ST elevation myocardial infarction (STEMI), unspecified artery [facility])  Status post coronary artery stent placement  Patient's Home Medications on Admission:  Current Outpatient Medications:    aspirin 81 MG chewable tablet, Chew 1 tablet (81 mg total) by mouth daily. (Patient not taking: Reported on 09/11/2022), Disp: 90 tablet, Rfl: 3   atorvastatin (LIPITOR) 80 MG tablet, Take 1 tablet (80 mg total) by mouth daily., Disp: 30 tablet, Rfl: 11   cetirizine (ZYRTEC) 10 MG tablet, Take by mouth., Disp: , Rfl:    clopidogrel (PLAVIX) 75 MG tablet, Take by mouth., Disp: , Rfl:    levothyroxine (SYNTHROID) 25 MCG tablet, Take 25 mcg by mouth daily before breakfast., Disp: , Rfl:    levothyroxine (SYNTHROID) 25 MCG tablet, Take by mouth. (Patient not taking: Reported on 09/11/2022), Disp: , Rfl:    lisinopril (ZESTRIL) 2.5 MG tablet, Take 1 tablet (2.5 mg total) by mouth daily., Disp: 30 tablet, Rfl: 2   metoprolol succinate (TOPROL-XL) 25 MG 24 hr tablet, Take by mouth., Disp: , Rfl:    metoprolol tartrate (LOPRESSOR) 25 MG tablet, Take 0.5 tablets (12.5 mg total) by mouth 2 (two) times daily. (Patient not taking: Reported on 09/11/2022), Disp: 30 tablet, Rfl: 5   metoprolol tartrate (LOPRESSOR) 25 MG tablet, Take by mouth. (Patient not taking: Reported on 09/11/2022), Disp: , Rfl:    naproxen (NAPROSYN) 500 MG tablet, Take 500 mg by mouth 2 (two) times daily., Disp: , Rfl:    nitroGLYCERIN (NITROSTAT) 0.4 MG SL tablet, Place 1 tablet (0.4 mg total) under the tongue every 5 (five) minutes as needed for chest  pain., Disp: 30 tablet, Rfl: 0   ticagrelor (BRILINTA) 90 MG TABS tablet, Take 1 tablet (90 mg total) by mouth 2 (two) times daily. (Patient not taking: Reported on 09/11/2022), Disp: 180 tablet, Rfl: 3  Past Medical History: Past Medical History:  Diagnosis Date   Hypertension    Sinus infection     Tobacco Use: Social History   Tobacco Use  Smoking Status Never  Smokeless Tobacco Never    Labs: Review Flowsheet       Latest Ref Rng & Units 08/13/2022 08/14/2022  Labs for ITP Cardiac and Pulmonary Rehab  Cholestrol 0 - 200 mg/dL 621  -  LDL (calc) 0 - 99 mg/dL 308  -  HDL-C >65 mg/dL 84  -  Trlycerides <784 mg/dL 70  -  Hemoglobin O9G 4.8 - 5.6 % - 5.7      Exercise Target Goals: Exercise Program Goal: Individual exercise prescription set using results from initial 6 min walk test and THRR while considering  patient's activity barriers and safety.   Exercise Prescription Goal: Initial exercise prescription builds to 30-45 minutes a day of aerobic activity, 2-3 days per week.  Home exercise guidelines will be given to patient during program as part of exercise prescription that the participant will acknowledge.   Education: Aerobic Exercise: - Group verbal and visual presentation on the components of exercise prescription. Introduces F.I.T.T principle from ACSM for exercise prescriptions.  Reviews F.I.T.T. principles of aerobic exercise including  progression. Written material given at graduation. Flowsheet Row Cardiac Rehab from 10/11/2022 in [facility] Cardiac and Pulmonary Rehab  Education need identified 09/18/22       Education: Resistance Exercise: - Group verbal and visual presentation on the components of exercise prescription. Introduces F.I.T.T principle from ACSM for exercise prescriptions  Reviews F.I.T.T. principles of resistance exercise including progression. Written material given at graduation.    Education: Exercise & Equipment Safety: - Individual verbal  instruction and demonstration of equipment use and safety with use of the equipment. Flowsheet Row Cardiac Rehab from 10/11/2022 in [facility] Cardiac and Pulmonary Rehab  Date 09/18/22  Educator [facility]  Instruction Review Code 1- Verbalizes Understanding       Education: Exercise Physiology & General Exercise Guidelines: - Group verbal and written instruction with models to review the exercise physiology of the cardiovascular system and associated critical values. Provides general exercise guidelines with specific guidelines to those with heart or lung disease.  Flowsheet Row Cardiac Rehab from 10/11/2022 in [facility] Cardiac and Pulmonary Rehab  Education need identified 09/18/22       Education: Flexibility, Balance, Mind/Body Relaxation: - Group verbal and visual presentation with interactive activity on the components of exercise prescription. Introduces F.I.T.T principle from ACSM for exercise prescriptions. Reviews F.I.T.T. principles of flexibility and balance exercise training including progression. Also discusses the mind body connection.  Reviews various relaxation techniques to help reduce and manage stress (i.e. Deep breathing, progressive muscle relaxation, and visualization). Balance handout provided to take home. Written material given at graduation.   Activity Barriers & Risk Stratification:  Activity Barriers & Cardiac Risk Stratification - 09/18/22 1652       Activity Barriers & Cardiac Risk Stratification   Activity Barriers None    Cardiac Risk Stratification High             6 Minute Walk:  6 Minute Walk     Row Name 09/18/22 1651         6 Minute Walk   Phase Initial     Distance 1135 feet     Walk Time 6 minutes     # of Rest Breaks 0     MPH 2.15     METS 3.18     RPE 9     Perceived Dyspnea  0     VO2 Peak 11.13     Symptoms No     Resting HR 83 bpm     Resting BP 132/82     Resting Oxygen Saturation  99 %     Exercise Oxygen Saturation  during 6 min  walk 97 %     Max Ex. HR 111 bpm     Max Ex. BP 130/82     2 Minute Post BP 118/62              Oxygen Initial Assessment:   Oxygen Re-Evaluation:   Oxygen Discharge (Final Oxygen Re-Evaluation):   Initial Exercise Prescription:  Initial Exercise Prescription - 09/18/22 1600       Date of Initial Exercise RX and Referring Provider   Date 09/18/22    Referring Provider callwood      Oxygen   Maintain Oxygen Saturation 88% or higher      Treadmill   MPH 2    Grade 1    Minutes 15    METs 2.81      Recumbant Bike   Level 2    RPM 50    Watts 20  Minutes 15    METs 3.18      NuStep   Level 2    SPM 80    Minutes 15    METs 3.18      REL-XR   Level 2    Speed 50    Minutes 15    METs 3.18      T5 Nustep   Level 1    SPM 80    Minutes 15    METs 3.18      Prescription Details   Frequency (times per week) 3    Duration Progress to 30 minutes of continuous aerobic without signs/symptoms of physical distress      Intensity   THRR 40-80% of Max Heartrate 111-139    Ratings of Perceived Exertion 11-13    Perceived Dyspnea 0-4      Progression   Progression Continue to progress workloads to maintain intensity without signs/symptoms of physical distress.      Resistance Training   Training Prescription Yes    Weight 3    Reps 10-15             Perform Capillary Blood Glucose checks as needed.  Exercise Prescription Changes:   Exercise Prescription Changes     Row Name 09/18/22 1600 10/09/22 1500 10/11/22 1600         Response to Exercise   Blood Pressure (Admit) 132/82 126/68 --     Blood Pressure (Exercise) 130/82 146/82 --     Blood Pressure (Exit) 118/62 112/62 --     Heart Rate (Admit) 83 bpm 63 bpm --     Heart Rate (Exercise) 111 bpm 111 bpm --     Heart Rate (Exit) 80 bpm 82 bpm --     Oxygen Saturation (Admit) 99 % -- --     Oxygen Saturation (Exercise) 97 % -- --     Oxygen Saturation (Exit) 100 % -- --     Rating  of Perceived Exertion (Exercise) 9 13 --     Perceived Dyspnea (Exercise) 0 -- --     Symptoms none none --     Comments 6 MWT results 2nd full day of exercise --     Duration -- Progress to 30 minutes of  aerobic without signs/symptoms of physical distress --     Intensity -- THRR unchanged --       Progression   Progression -- Continue to progress workloads to maintain intensity without signs/symptoms of physical distress. --     Average METs -- 2.57 --       Resistance Training   Training Prescription -- Yes --     Weight -- 3 lb --     Reps -- 10-15 --       Interval Training   Interval Training -- No --       Recumbant Bike   Level -- 1.2 --     Watts -- 20 --     Minutes -- 15 --     METs -- 2.6 --       NuStep   Level -- 4 --     Minutes -- 15 --     METs -- 2.7 --       Home Exercise Plan   Plans to continue exercise at -- -- Home (comment)  Walking, weights, yoga     Frequency -- -- Add 2 additional days to program exercise sessions.     Initial Home Exercises Provided -- --  10/11/22       Oxygen   Maintain Oxygen Saturation -- 88% or higher 88% or higher              Exercise Comments:   Exercise Comments     Row Name 09/25/22 1600           Exercise Comments First full day of exercise!  Patient was oriented to gym and equipment including functions, settings, policies, and procedures.  Patient's individual exercise prescription and treatment plan were reviewed.  All starting workloads were established based on the results of the 6 minute walk test done at initial orientation visit.  The plan for exercise progression was also introduced and progression will be customized based on patient's performance and goals.                Exercise Goals and Review:   Exercise Goals     Row Name 09/18/22 1655             Exercise Goals   Increase Physical Activity Yes       Intervention Provide advice, education, support and counseling about  physical activity/exercise needs.;Develop an individualized exercise prescription for aerobic and resistive training based on initial evaluation findings, risk stratification, comorbidities and participant's personal goals.       Expected Outcomes Short Term: Attend rehab on a regular basis to increase amount of physical activity.;Long Term: Add in home exercise to make exercise part of routine and to increase amount of physical activity.;Long Term: Exercising regularly at least 3-5 days a week.       Increase Strength and Stamina Yes       Intervention Provide advice, education, support and counseling about physical activity/exercise needs.;Develop an individualized exercise prescription for aerobic and resistive training based on initial evaluation findings, risk stratification, comorbidities and participant's personal goals.       Expected Outcomes Short Term: Increase workloads from initial exercise prescription for resistance, speed, and METs.;Short Term: Perform resistance training exercises routinely during rehab and add in resistance training at home;Long Term: Improve cardiorespiratory fitness, muscular endurance and strength as measured by increased METs and functional capacity ( )       Able to understand and use rate of perceived exertion (RPE) scale Yes       Intervention Provide education and explanation on how to use RPE scale       Expected Outcomes Short Term: Able to use RPE daily in rehab to express subjective intensity level;Long Term:  Able to use RPE to guide intensity level when exercising independently       Able to understand and use Dyspnea scale Yes       Intervention Provide education and explanation on how to use Dyspnea scale       Expected Outcomes Short Term: Able to use Dyspnea scale daily in rehab to express subjective sense of shortness of breath during exertion;Long Term: Able to use Dyspnea scale to guide intensity level when exercising independently       Knowledge  and understanding of Target Heart Rate Range (THRR) Yes       Intervention Provide education and explanation of THRR including how the numbers were predicted and where they are located for reference       Expected Outcomes Short Term: Able to state/look up THRR;Long Term: Able to use THRR to govern intensity when exercising independently;Short Term: Able to use daily as guideline for intensity in rehab       Able to  check pulse independently Yes       Intervention Provide education and demonstration on how to check pulse in carotid and radial arteries.;Review the importance of being able to check your own pulse for safety during independent exercise       Expected Outcomes Short Term: Able to explain why pulse checking is important during independent exercise       Understanding of Exercise Prescription Yes       Intervention Provide education, explanation, and written materials on patient's individual exercise prescription       Expected Outcomes Short Term: Able to explain program exercise prescription;Long Term: Able to explain home exercise prescription to exercise independently                Exercise Goals Re-Evaluation :  Exercise Goals Re-Evaluation     Row Name 09/25/22 1601 10/09/22 1545 10/09/22 1555 10/11/22 1609       Exercise Goal Re-Evaluation   Exercise Goals Review Increase Physical Activity;Knowledge and understanding of Target Heart Rate Range (THRR);Able to understand and use rate of perceived exertion (RPE) scale;Understanding of Exercise Prescription;Increase Strength and Stamina;Able to check pulse independently Increase Physical Activity;Increase Strength and Stamina;Understanding of Exercise Prescription Increase Physical Activity;Increase Strength and Stamina;Understanding of Exercise Prescription Able to understand and use Dyspnea scale;Understanding of Exercise Prescription;Knowledge and understanding of Target Heart Rate Range (THRR);Able to check pulse  independently;Able to understand and use rate of perceived exertion (RPE) scale;Increase Strength and Stamina;Increase Physical Activity    Comments Reviewed RPE scale, THR and program prescription with pt today.  Pt voiced understanding and was given a copy of goals to take home. Stacey Francis is off to a good start in rehab.  She is doing some yoga at home on her off days.  She also does a little resistance training and walking as well. We will review home exercise guidelines with her once she has been coming consistently for a bit.  She was encouraged to maintain what she was currently doing. Stacey Francis has done well with her first couple of sessions of rehab. She has started off with her initial exercise prescription with appropriate RPEs. She did hit her THR the 2 sessions she was here and we hope to see that kept up over her time here at rehab. Will continue to monitor as she progresses. Reviewed home exercise with pt today.  Pt plans to walk at home, use weights, and do yoga for exercise.  Reviewed THR, pulse, RPE, sign and symptoms, pulse oximetery and when to call 911 or MD.  Also discussed weather considerations and indoor options.  Pt voiced understanding.    Expected Outcomes Short: Use RPE daily to regulate intensity.  Long: Follow program prescription in THR. Short: Review home exercise Long: Conitnue to attend rehab to build stamina Short: Continue initial exercise prescription Long: Improve overall strength and stamina Short: Walk at home on days away from rehab. Long: Continue to improve strength and stamina.             Discharge Exercise Prescription (Final Exercise Prescription Changes):  Exercise Prescription Changes - 10/11/22 1600       Home Exercise Plan   Plans to continue exercise at Home (comment)   Walking, weights, yoga   Frequency Add 2 additional days to program exercise sessions.    Initial Home Exercises Provided 10/11/22      Oxygen   Maintain Oxygen Saturation 88% or  higher  Nutrition:  Target Goals: Understanding of nutrition guidelines, daily intake of sodium 1500mg , cholesterol 200mg , calories 30% from fat and 7% or less from saturated fats, daily to have 5 or more servings of fruits and vegetables.  Education: All About Nutrition: -Group instruction provided by verbal, written material, interactive activities, discussions, models, and posters to present general guidelines for heart healthy nutrition including fat, fiber, MyPlate, the role of sodium in heart healthy nutrition, utilization of the nutrition label, and utilization of this knowledge for meal planning. Follow up email sent as well. Written material given at graduation. Flowsheet Row Cardiac Rehab from 10/11/2022 in [facility] Cardiac and Pulmonary Rehab  Education need identified 09/18/22       Biometrics:  Pre Biometrics - 09/18/22 1656       Pre Biometrics   Height 5' 3.5" (1.613 m)    Weight 116 lb 11.2 oz (52.9 kg)    Waist Circumference 28 inches    Hip Circumference 36.5 inches    Waist to Hip Ratio 0.77 %    BMI (Calculated) 20.35    Single Leg Stand 30 seconds              Nutrition Therapy Plan and Nutrition Goals:  Nutrition Therapy & Goals - 09/18/22 1657       Intervention Plan   Intervention Prescribe, educate and counsel regarding individualized specific dietary modifications aiming towards targeted core components such as weight, hypertension, lipid management, diabetes, heart failure and other comorbidities.    Expected Outcomes Short Term Goal: Understand basic principles of dietary content, such as calories, fat, sodium, cholesterol and nutrients.;Short Term Goal: A plan has been developed with personal nutrition goals set during dietitian appointment.;Long Term Goal: Adherence to prescribed nutrition plan.             Nutrition Assessments:  MEDIFICTS Score Key: ?70 Need to make dietary changes  40-70 Heart Healthy Diet ? 40  Therapeutic Level Cholesterol Diet  Flowsheet Row Cardiac Rehab from 09/18/2022 in [facility] Cardiac and Pulmonary Rehab  Picture Your Plate Total Score on Admission 82      Picture Your Plate Scores: <16 Unhealthy dietary pattern with much room for improvement. 41-50 Dietary pattern unlikely to meet recommendations for good health and room for improvement. 51-60 More healthful dietary pattern, with some room for improvement.  >60 Healthy dietary pattern, although there may be some specific behaviors that could be improved.    Nutrition Goals Re-Evaluation:  Nutrition Goals Re-Evaluation     Row Name 10/09/22 1551             Goals   Comment Stacey Francis is off to a good start in rehab  She was not able to meet with dietitian before they left but would like once new one starts.  She feels that she is already doing well on her heart healthy diet.  She avoids processed foods and usually sticks to organics. She eats lots of fruits and vegetables.  She also has cut way back on sugar but will treat herself on occassion.       Expected Outcome Short: Continue with diet Long: Continue to avoid sugar                Nutrition Goals Discharge (Final Nutrition Goals Re-Evaluation):  Nutrition Goals Re-Evaluation - 10/09/22 1551       Goals   Comment Stacey Francis is off to a good start in rehab  She was not able to meet with dietitian before they left  but would like once new one starts.  She feels that she is already doing well on her heart healthy diet.  She avoids processed foods and usually sticks to organics. She eats lots of fruits and vegetables.  She also has cut way back on sugar but will treat herself on occassion.    Expected Outcome Short: Continue with diet Long: Continue to avoid sugar             Psychosocial: Target Goals: Acknowledge presence or absence of significant depression and/or stress, maximize coping skills, provide positive support system. Participant is able to  verbalize types and ability to use techniques and skills needed for reducing stress and depression.   Education: Stress, Anxiety, and Depression - Group verbal and visual presentation to define topics covered.  Reviews how body is impacted by stress, anxiety, and depression.  Also discusses healthy ways to reduce stress and to treat/manage anxiety and depression.  Written material given at graduation.   Education: Sleep Hygiene -Provides group verbal and written instruction about how sleep can affect your health.  Define sleep hygiene, discuss sleep cycles and impact of sleep habits. Review good sleep hygiene tips.    Initial Review & Psychosocial Screening:  Initial Psych Review & Screening - 09/11/22 1025       Initial Review   Current issues with Current Stress Concerns    Source of Stress Concerns Chronic Illness    Comments She has been more frustrated with her limitations and her health. She is not able to do the things that she wants to do. For the past two years she has been trying to find out what is wrong with her and did not find it until now. Her doctor told her she still has a blockage and cant to anything about it now.      Family Dynamics   Good Support System? Yes    Comments [place]re can look to her four kids for support and are helpful.             Quality of Life Scores:   Quality of Life - 09/18/22 1658       Quality of Life   Select Quality of Life      Quality of Life Scores   Health/Function Pre 14.07 %    Socioeconomic Pre 26.08 %    Psych/Spiritual Pre 25 %    Family Pre 28.5 %    GLOBAL Pre 20.58 %            Scores of 19 and below usually indicate a poorer quality of life in these areas.  A difference of  2-3 points is a clinically meaningful difference.  A difference of 2-3 points in the total score of the Quality of Life Index has been associated with significant improvement in overall quality of life, self-image, physical symptoms, and  general health in studies assessing change in quality of life.  PHQ-9: Review Flowsheet       09/18/2022  Depression screen PHQ 2/9  Decreased Interest 0  Down, Depressed, Hopeless 0  PHQ - 2 Score 0  Altered sleeping 0  Tired, decreased energy 1  Change in appetite 0  Feeling bad or failure about yourself  0  Trouble concentrating 0  Moving slowly or fidgety/restless 0  Suicidal thoughts 0  PHQ-9 Score 1  Difficult doing work/chores Not difficult at all   Interpretation of Total Score  Total Score Depression Severity:  1-4 = Minimal depression, 5-9 =  Mild depression, 10-14 = Moderate depression, 15-19 = Moderately severe depression, 20-27 = Severe depression   Psychosocial Evaluation and Intervention:  Psychosocial Evaluation - 09/11/22 1032       Psychosocial Evaluation & Interventions   Interventions Encouraged to exercise with the program and follow exercise prescription;Relaxation education;Stress management education    Comments She has been more frustrated with her limitations and her health. She is not able to do the things that she wants to do. For the past two years she has been trying to find out what is wrong with her and did not find it until now. Her doctor told her she still has a blockage and cant to anything about it now.Rein can look to her four kids for support and are helpful.    Expected Outcomes Short: Start HeartTrack to help with mood. Long: Maintain a healthy mental state    Continue Psychosocial Services  Follow up required by staff             Psychosocial Re-Evaluation:  Psychosocial Re-Evaluation     Row Name 10/09/22 1548             Psychosocial Re-Evaluation   Current issues with Current Stress Concerns       Comments Stacey Francis is doing well in rehab so far.  She is doing well mentally for most part.  She has days that she gets really frustrated with her fatigue levels and not being able to go and do like she used to.  She wants to  continue to build back up to get back to being active again. She is sleeping pretty good most of the time.  She will wake about once a night, but usually able to get back to sleep easily. She is still concerned about her LAD not being fully functional, and has just had more testing with it.       Expected Outcomes Short: Follow up on artery workup and exercise for mental boost Long: Continue to build back up to being able to go and do       Interventions Encouraged to attend Cardiac Rehabilitation for the exercise       Continue Psychosocial Services  Follow up required by staff         Initial Review   Source of Stress Concerns Unable to participate in former interests or hobbies;Unable to perform yard/household activities                Psychosocial Discharge (Final Psychosocial Re-Evaluation):  Psychosocial Re-Evaluation - 10/09/22 1548       Psychosocial Re-Evaluation   Current issues with Current Stress Concerns    Comments Stacey Francis is doing well in rehab so far.  She is doing well mentally for most part.  She has days that she gets really frustrated with her fatigue levels and not being able to go and do like she used to.  She wants to continue to build back up to get back to being active again. She is sleeping pretty good most of the time.  She will wake about once a night, but usually able to get back to sleep easily. She is still concerned about her LAD not being fully functional, and has just had more testing with it.    Expected Outcomes Short: Follow up on artery workup and exercise for mental boost Long: Continue to build back up to being able to go and do    Interventions Encouraged to attend Cardiac Rehabilitation for the exercise  Continue Psychosocial Services  Follow up required by staff      Initial Review   Source of Stress Concerns Unable to participate in former interests or hobbies;Unable to perform yard/household activities             Vocational  Rehabilitation: Provide vocational rehab assistance to qualifying candidates.   Vocational Rehab Evaluation & Intervention:   Education: Education Goals: Education classes will be provided on a variety of topics geared toward better understanding of heart health and risk factor modification. Participant will state understanding/return demonstration of topics presented as noted by education test scores.  Learning Barriers/Preferences:  Learning Barriers/Preferences - 09/11/22 1024       Learning Barriers/Preferences   Learning Barriers None    Learning Preferences None             General Cardiac Education Topics:  AED/CPR: - Group verbal and written instruction with the use of models to demonstrate the basic use of the AED with the basic ABC's of resuscitation.   Anatomy and Cardiac Procedures: - Group verbal and visual presentation and models provide information about basic cardiac anatomy and function. Reviews the testing methods done to diagnose heart disease and the outcomes of the test results. Describes the treatment choices: Medical Management, Angioplasty, or Coronary Bypass Surgery for treating various heart conditions including Myocardial Infarction, Angina, Valve Disease, and Cardiac Arrhythmias.  Written material given at graduation.   Medication Safety: - Group verbal and visual instruction to review commonly prescribed medications for heart and lung disease. Reviews the medication, class of the drug, and side effects. Includes the steps to properly store meds and maintain the prescription regimen.  Written material given at graduation. Flowsheet Row Cardiac Rehab from 10/11/2022 in [facility] Cardiac and Pulmonary Rehab  Date 10/04/22  Educator SB  Instruction Review Code 1- Verbalizes Understanding       Intimacy: - Group verbal instruction through game format to discuss how heart and lung disease can affect sexual intimacy. Written material given at  graduation..   Know Your Numbers and Heart Failure: - Group verbal and visual instruction to discuss disease risk factors for cardiac and pulmonary disease and treatment options.  Reviews associated critical values for Overweight/Obesity, Hypertension, Cholesterol, and Diabetes.  Discusses basics of heart failure: signs/symptoms and treatments.  Introduces Heart Failure Zone chart for action plan for heart failure.  Written material given at graduation. Flowsheet Row Cardiac Rehab from 10/11/2022 in [facility] Cardiac and Pulmonary Rehab  Date 10/11/22  Educator MS  Instruction Review Code 1- Verbalizes Understanding       Infection Prevention: - Provides verbal and written material to individual with discussion of infection control including proper hand washing and proper equipment cleaning during exercise session. Flowsheet Row Cardiac Rehab from 10/11/2022 in [facility] Cardiac and Pulmonary Rehab  Date 09/18/22  Educator [facility]  Instruction Review Code 1- Verbalizes Understanding       Falls Prevention: - Provides verbal and written material to individual with discussion of falls prevention and safety. Flowsheet Row Cardiac Rehab from 10/11/2022 in [facility] Cardiac and Pulmonary Rehab  Date 09/18/22  Educator [facility]  Instruction Review Code 1- Verbalizes Understanding       Other: -Provides group and verbal instruction on various topics (see comments)   Knowledge Questionnaire Score:  Knowledge Questionnaire Score - 09/18/22 1658       Knowledge Questionnaire Score   Pre Score 23/26             Core Components/Risk Factors/Patient  Goals at Admission:  Personal Goals and Risk Factors at Admission - 09/18/22 1657       Core Components/Risk Factors/Patient Goals on Admission    Weight Management Yes;Weight Gain    Intervention Weight Management: Develop a combined nutrition and exercise program designed to reach desired caloric intake, while maintaining appropriate intake of nutrient and  fiber, sodium and fats, and appropriate energy expenditure required for the weight goal.;Weight Management: Provide education and appropriate resources to help participant work on and attain dietary goals.;Weight Management/Obesity: Establish reasonable short term and long term weight goals.    Admit Weight 116 lb 11.2 oz (52.9 kg)    Goal Weight: Short Term 120 lb (54.4 kg)    Goal Weight: Long Term 120 lb (54.4 kg)    Expected Outcomes Short Term: Continue to assess and modify interventions until short term weight is achieved;Long Term: Adherence to nutrition and physical activity/exercise program aimed toward attainment of established weight goal;Weight Maintenance: Understanding of the daily nutrition guidelines, which includes 25-35% calories from fat, 7% or less cal from saturated fats, less than 200mg  cholesterol, less than 1.5gm of sodium, & 5 or more servings of fruits and vegetables daily;Understanding recommendations for meals to include 15-35% energy as protein, 25-35% energy from fat, 35-60% energy from carbohydrates, less than 200mg  of dietary cholesterol, 20-35 gm of total fiber daily;Understanding of distribution of calorie intake throughout the day with the consumption of 4-5 meals/snacks;Weight Gain: Understanding of general recommendations for a high calorie, high protein meal plan that promotes weight gain by distributing calorie intake throughout the day with the consumption for 4-5 meals, snacks, and/or supplements    Hypertension Yes    Intervention Provide education on lifestyle modifcations including regular physical activity/exercise, weight management, moderate sodium restriction and increased consumption of fresh fruit, vegetables, and low fat dairy, alcohol moderation, and smoking cessation.;Monitor prescription use compliance.    Expected Outcomes Short Term: Continued assessment and intervention until BP is < 140/66mm HG in hypertensive participants. < 130/67mm HG in  hypertensive participants with diabetes, heart failure or chronic kidney disease.;Long Term: Maintenance of blood pressure at goal levels.    Lipids Yes    Intervention Provide education and support for participant on nutrition & aerobic/resistive exercise along with prescribed medications to achieve LDL 70mg , HDL >40mg .    Expected Outcomes Short Term: Participant states understanding of desired cholesterol values and is compliant with medications prescribed. Participant is following exercise prescription and nutrition guidelines.;Long Term: Cholesterol controlled with medications as prescribed, with individualized exercise RX and with personalized nutrition plan. Value goals: LDL < 70mg , HDL > 40 mg.             Education:Diabetes - Individual verbal and written instruction to review signs/symptoms of diabetes, desired ranges of glucose level fasting, after meals and with exercise. Acknowledge that pre and post exercise glucose checks will be done for 3 sessions at entry of program.   Core Components/Risk Factors/Patient Goals Review:   Goals and Risk Factor Review     Row Name 10/09/22 1555             Core Components/Risk Factors/Patient Goals Review   Personal Goals Review Weight Management/Obesity;Hypertension       Review Stacey Francis is off to a good start in rehab.  Her weight is staying fairly steady between 115-118 lb.  Her blood pressures are doing well overall, on the upper end of normal.  She is checking them at home.  She is usually in the  130s but has gotten up to 180s on occassion.  She was encouraged to bring her cuff into class to check again what we are getting as well.       Expected Outcomes Short: Bring in blood pressure cuff to check Long: conitnue to maintain weight                Core Components/Risk Factors/Patient Goals at Discharge (Final Review):   Goals and Risk Factor Review - 10/09/22 1555       Core Components/Risk Factors/Patient Goals Review    Personal Goals Review Weight Management/Obesity;Hypertension    Review Stacey Francis is off to a good start in rehab.  Her weight is staying fairly steady between 115-118 lb.  Her blood pressures are doing well overall, on the upper end of normal.  She is checking them at home.  She is usually in the 130s but has gotten up to 180s on occassion.  She was encouraged to bring her cuff into class to check again what we are getting as well.    Expected Outcomes Short: Bring in blood pressure cuff to check Long: conitnue to maintain weight             ITP Comments:  ITP Comments     Row Name 09/11/22 1024 09/18/22 1650 09/20/22 1424 09/25/22 1600 10/18/22 0945   ITP Comments Virtual Visit completed. Patient informed on EP and RD appointment and 6 Minute walk test. Patient also informed of patient health questionnaires on My Chart. Patient Verbalizes understanding. Visit diagnosis can be found in [facility] 08/13/2022. Completed and gym orientation. Initial ITP created and sent for review to Dr. Bethann Punches, Medical Director. 30 day review completed. ITP sent to Dr. Bethann Punches, Medical Director of Cardiac Rehab. Continue with ITP unless changes are made by physician.   Pt new to program. First full day of exercise!  Patient was oriented to gym and equipment including functions, settings, policies, and procedures.  Patient's individual exercise prescription and treatment plan were reviewed.  All starting workloads were established based on the results of the 6 minute walk test done at initial orientation visit.  The plan for exercise progression was also introduced and progression will be customized based on patient's performance and goals. 30 Day review completed. Medical Director ITP review done, changes made as directed, and signed approval by Medical Director.    new to program            Comments:

## 2022-10-19 ENCOUNTER — Encounter: Payer: No Typology Code available for payment source | Admitting: *Deleted

## 2022-10-19 DIAGNOSIS — I213 ST elevation (STEMI) myocardial infarction of unspecified site: Secondary | ICD-10-CM

## 2022-10-19 DIAGNOSIS — Z955 Presence of coronary angioplasty implant and graft: Secondary | ICD-10-CM

## 2022-10-19 NOTE — Progress Notes (Signed)
Daily Session Note  Patient Details  Name: Stacey Francis MRN: [id number] Date of Birth: 09-01-1955 Referring Provider:   Flowsheet Row Cardiac Rehab from 09/18/2022 in [facility] Cardiac and Pulmonary Rehab  Referring Provider callwood       Encounter Date: 10/19/2022  Check In:  Session Check In - 10/19/22 1538       Check-In   Supervising physician immediately available to respond to emergencies See telemetry face sheet for immediately available ER MD    Location [facility]-Cardiac & Pulmonary Rehab    Staff Present Susann Givens, RN BSN;Joseph [place], RCP,RRT,BSRT;Jessica [place], [place], RCEP, CCRP, CCET    Virtual Visit No    Medication changes reported     No    Fall or balance concerns reported    No    Warm-up and Cool-down Performed on first and last piece of equipment    Resistance Training Performed Yes    VAD Patient? No    PAD/SET Patient? No      Pain Assessment   Currently in Pain? No/denies                Social History   Tobacco Use  Smoking Status Never  Smokeless Tobacco Never    Goals Met:  Independence with exercise equipment Exercise tolerated well No report of concerns or symptoms today Strength training completed today  Goals Unmet:  Not Applicable  Comments: Pt able to follow exercise prescription today without complaint.  Will continue to monitor for progression.    Dr. Bethann Punches is Medical Director for [facility] Cardiac Rehabilitation.  Dr. Vida Rigger is Medical Director for [facility] Pulmonary Rehabilitation.

## 2022-10-23 ENCOUNTER — Encounter: Payer: No Typology Code available for payment source | Admitting: *Deleted

## 2022-10-23 DIAGNOSIS — I213 ST elevation (STEMI) myocardial infarction of unspecified site: Secondary | ICD-10-CM | POA: Diagnosis not present

## 2022-10-23 DIAGNOSIS — Z955 Presence of coronary angioplasty implant and graft: Secondary | ICD-10-CM

## 2022-10-23 NOTE — Progress Notes (Signed)
Daily Session Note  Patient Details  Name: AIYANNA BAMBACH MRN: [id number] Date of Birth: [birth year] Referring Provider:   Flowsheet Row Cardiac Rehab from 09/18/2022 in [facility] Cardiac and Pulmonary Rehab  Referring Provider callwood       Encounter Date: 10/23/2022  Check In:  Session Check In - 10/23/22 1538       Check-In   Supervising physician immediately available to respond to emergencies See telemetry face sheet for immediately available ER MD    Location [facility]-Cardiac & Pulmonary Rehab    Staff Present Susann Givens, RN BSN;Noah Tickle, BS, Exercise Physiologist;Joseph [place], [place]    Virtual Visit No    Medication changes reported     No    Fall or balance concerns reported    No    Warm-up and Cool-down Performed on first and last piece of equipment    Resistance Training Performed Yes    VAD Patient? No    PAD/SET Patient? No      Pain Assessment   Currently in Pain? No/denies                Social History   Tobacco Use  Smoking Status Never  Smokeless Tobacco Never    Goals Met:  Independence with exercise equipment Exercise tolerated well No report of concerns or symptoms today Strength training completed today  Goals Unmet:  Not Applicable  Comments: Pt able to follow exercise prescription today without complaint.  Will continue to monitor for progression.    Dr. Bethann Punches is Medical Director for [facility] Cardiac Rehabilitation.  Dr. Vida Rigger is Medical Director for [facility] Pulmonary Rehabilitation.

## 2022-10-25 ENCOUNTER — Encounter: Payer: No Typology Code available for payment source | Admitting: *Deleted

## 2022-10-25 DIAGNOSIS — Z955 Presence of coronary angioplasty implant and graft: Secondary | ICD-10-CM

## 2022-10-25 DIAGNOSIS — I213 ST elevation (STEMI) myocardial infarction of unspecified site: Secondary | ICD-10-CM | POA: Diagnosis not present

## 2022-10-25 NOTE — Progress Notes (Signed)
Daily Session Note  Patient Details  Name: Stacey Francis MRN: [id number] Date of Birth: [birth year] Referring Provider:   Flowsheet Row Cardiac Rehab from 09/18/2022 in [facility] Cardiac and Pulmonary Rehab  Referring Provider callwood       Encounter Date: 10/25/2022  Check In:  Session Check In - 10/25/22 1616       Check-In   Supervising physician immediately available to respond to emergencies See telemetry face sheet for immediately available ER MD    Location [facility]-Cardiac & Pulmonary Rehab    Staff Present Elige Ko, Guinevere Ferrari, RN, Eben Burow RN, BSN    Virtual Visit No    Medication changes reported     No    Fall or balance concerns reported    No    Warm-up and Cool-down Performed on first and last piece of equipment    Resistance Training Performed Yes    VAD Patient? No    PAD/SET Patient? No      Pain Assessment   Currently in Pain? No/denies                Social History   Tobacco Use  Smoking Status Never  Smokeless Tobacco Never    Goals Met:  Independence with exercise equipment Exercise tolerated well No report of concerns or symptoms today Strength training completed today  Goals Unmet:  Not Applicable  Comments: Pt able to follow exercise prescription today without complaint.  Will continue to monitor for progression.    Dr. Bethann Punches is Medical Director for [facility] Cardiac Rehabilitation.  Dr. Vida Rigger is Medical Director for [facility] Pulmonary Rehabilitation.

## 2022-10-26 ENCOUNTER — Encounter: Payer: No Typology Code available for payment source | Admitting: *Deleted

## 2022-10-26 DIAGNOSIS — Z955 Presence of coronary angioplasty implant and graft: Secondary | ICD-10-CM

## 2022-10-26 DIAGNOSIS — I213 ST elevation (STEMI) myocardial infarction of unspecified site: Secondary | ICD-10-CM

## 2022-10-26 NOTE — Progress Notes (Signed)
Daily Session Note  Patient Details  Name: Stacey Francis MRN: [id number] Date of Birth: 1956-01-28 Referring Provider:   Flowsheet Row Cardiac Rehab from 09/18/2022 in [facility] Cardiac and Pulmonary Rehab  Referring Provider callwood       Encounter Date: 10/26/2022  Check In:  Session Check In - 10/26/22 1539       Check-In   Supervising physician immediately available to respond to emergencies See telemetry face sheet for immediately available ER MD    Location [facility]-Cardiac & Pulmonary Rehab    Staff Present Susann Givens, RN BSN;Joseph Reino [place], RCP,RRT,BSRT;Noah [place], [place], Exercise Physiologist    Virtual Visit No    Medication changes reported     No    Fall or balance concerns reported    No    Warm-up and Cool-down Performed on first and last piece of equipment    Resistance Training Performed Yes    VAD Patient? No    PAD/SET Patient? No      Pain Assessment   Currently in Pain? No/denies                Social History   Tobacco Use  Smoking Status Never  Smokeless Tobacco Never    Goals Met:  Independence with exercise equipment Exercise tolerated well No report of concerns or symptoms today Strength training completed today  Goals Unmet:  Not Applicable  Comments: Pt able to follow exercise prescription today without complaint.  Will continue to monitor for progression.    Dr. Bethann Punches is Medical Director for [facility] Cardiac Rehabilitation.  Dr. Vida Rigger is Medical Director for [facility] Pulmonary Rehabilitation.

## 2022-11-01 ENCOUNTER — Encounter: Payer: No Typology Code available for payment source | Admitting: *Deleted

## 2022-11-01 DIAGNOSIS — Z955 Presence of coronary angioplasty implant and graft: Secondary | ICD-10-CM

## 2022-11-01 DIAGNOSIS — I213 ST elevation (STEMI) myocardial infarction of unspecified site: Secondary | ICD-10-CM | POA: Diagnosis not present

## 2022-11-01 NOTE — Progress Notes (Signed)
Daily Session Note  Patient Details  Name: KISHARA LOVELAND MRN: [id number] Date of Birth: [birth year] Referring Provider:   Flowsheet Row Cardiac Rehab from 09/18/2022 in [facility] Cardiac and Pulmonary Rehab  Referring Provider callwood       Encounter Date: 11/01/2022  Check In:  Session Check In - 11/01/22 1546       Check-In   Supervising physician immediately available to respond to emergencies See telemetry face sheet for immediately available ER MD    Location [facility]-Cardiac & Pulmonary Rehab    Staff Present Susann Givens, RN BSN;Joseph Reino [place], Guinevere Ferrari, RN, [place]    Virtual Visit No    Medication changes reported     No    Fall or balance concerns reported    No    Warm-up and Cool-down Performed on first and last piece of equipment    Resistance Training Performed Yes    VAD Patient? No    PAD/SET Patient? No      Pain Assessment   Currently in Pain? No/denies                Social History   Tobacco Use  Smoking Status Never  Smokeless Tobacco Never    Goals Met:  Independence with exercise equipment Exercise tolerated well No report of concerns or symptoms today Strength training completed today  Goals Unmet:  Not Applicable  Comments: Pt able to follow exercise prescription today without complaint.  Will continue to monitor for progression.    Dr. Bethann Punches is Medical Director for [facility] Cardiac Rehabilitation.  Dr. Vida Rigger is Medical Director for [facility] Pulmonary Rehabilitation.

## 2022-11-02 ENCOUNTER — Encounter: Payer: No Typology Code available for payment source | Admitting: *Deleted

## 2022-11-02 DIAGNOSIS — Z955 Presence of coronary angioplasty implant and graft: Secondary | ICD-10-CM

## 2022-11-02 DIAGNOSIS — I213 ST elevation (STEMI) myocardial infarction of unspecified site: Secondary | ICD-10-CM | POA: Diagnosis not present

## 2022-11-02 NOTE — Progress Notes (Signed)
Daily Session Note  Patient Details  Name: Stacey Francis MRN: [id number] Date of Birth: 1956-02-10 Referring Provider:   Flowsheet Row Cardiac Rehab from 09/18/2022 in [facility] Cardiac and Pulmonary Rehab  Referring Provider callwood       Encounter Date: 11/02/2022  Check In:  Session Check In - 11/02/22 1551       Check-In   Supervising physician immediately available to respond to emergencies See telemetry face sheet for immediately available ER MD    Location [facility]-Cardiac & Pulmonary Rehab    Staff Present Elige Ko, RCP,RRT,BSRT;Kalila Adkison Jewel Baize, RN BSN;Jessica Juanetta [place], MA, RCEP, CCRP, CCET    Virtual Visit No    Medication changes reported     No    Fall or balance concerns reported    No    Warm-up and Cool-down Performed on first and last piece of equipment    Resistance Training Performed Yes    VAD Patient? No    PAD/SET Patient? No      Pain Assessment   Currently in Pain? No/denies                Social History   Tobacco Use  Smoking Status Never  Smokeless Tobacco Never    Goals Met:  Independence with exercise equipment Exercise tolerated well No report of concerns or symptoms today Strength training completed today  Goals Unmet:  Not Applicable  Comments: Pt able to follow exercise prescription today without complaint.  Will continue to monitor for progression.    Dr. Bethann Punches is Medical Director for [facility] Cardiac Rehabilitation.  Dr. Vida Rigger is Medical Director for [facility] Pulmonary Rehabilitation.

## 2022-11-06 ENCOUNTER — Encounter: Payer: No Typology Code available for payment source | Attending: Internal Medicine | Admitting: *Deleted

## 2022-11-06 DIAGNOSIS — Z955 Presence of coronary angioplasty implant and graft: Secondary | ICD-10-CM | POA: Insufficient documentation

## 2022-11-06 DIAGNOSIS — I252 Old myocardial infarction: Secondary | ICD-10-CM | POA: Diagnosis not present

## 2022-11-06 DIAGNOSIS — Z48812 Encounter for surgical aftercare following surgery on the circulatory system: Secondary | ICD-10-CM | POA: Diagnosis not present

## 2022-11-06 DIAGNOSIS — Z5189 Encounter for other specified aftercare: Secondary | ICD-10-CM | POA: Insufficient documentation

## 2022-11-06 DIAGNOSIS — I213 ST elevation (STEMI) myocardial infarction of unspecified site: Secondary | ICD-10-CM | POA: Diagnosis not present

## 2022-11-06 NOTE — Progress Notes (Signed)
Daily Session Note  Patient Details  Name: MISTEE DIMARCO MRN: [id number] Date of Birth: [birth year] Referring Provider:   Flowsheet Row Cardiac Rehab from 09/18/2022 in [facility] Cardiac and Pulmonary Rehab  Referring Provider callwood       Encounter Date: 11/06/2022  Check In:  Session Check In - 11/06/22 1610       Check-In   Supervising physician immediately available to respond to emergencies See telemetry face sheet for immediately available ER MD    Location [facility]-Cardiac & Pulmonary Rehab    Staff Present Cyndia Diver, RN, BSN, Cammie Sickle, RCP,RRT,BSRT;Noah Tickle, BS, Exercise Physiologist    Virtual Visit No    Medication changes reported     No    Fall or balance concerns reported    No    Tobacco Cessation No Change    Warm-up and Cool-down Performed on first and last piece of equipment    Resistance Training Performed Yes    VAD Patient? No    PAD/SET Patient? No      Pain Assessment   Currently in Pain? No/denies                Social History   Tobacco Use  Smoking Status Never  Smokeless Tobacco Never    Goals Met:  Independence with exercise equipment Exercise tolerated well No report of concerns or symptoms today  Goals Unmet:  Not Applicable  Comments: Pt able to follow exercise prescription today without complaint.  Will continue to monitor for progression.    Dr. Bethann Punches is Medical Director for [facility] Cardiac Rehabilitation.  Dr. Vida Rigger is Medical Director for [facility] Pulmonary Rehabilitation.

## 2022-11-08 ENCOUNTER — Encounter: Payer: No Typology Code available for payment source | Admitting: *Deleted

## 2022-11-08 DIAGNOSIS — Z955 Presence of coronary angioplasty implant and graft: Secondary | ICD-10-CM

## 2022-11-08 DIAGNOSIS — Z5189 Encounter for other specified aftercare: Secondary | ICD-10-CM | POA: Diagnosis not present

## 2022-11-08 DIAGNOSIS — I252 Old myocardial infarction: Secondary | ICD-10-CM | POA: Diagnosis not present

## 2022-11-08 DIAGNOSIS — I213 ST elevation (STEMI) myocardial infarction of unspecified site: Secondary | ICD-10-CM | POA: Diagnosis not present

## 2022-11-08 NOTE — Progress Notes (Signed)
Daily Session Note  Patient Details  Name: KHALEE DEWINDT MRN: [id number] Date of Birth: Feb 15, 1956 Referring Provider:   Flowsheet Row Cardiac Rehab from 09/18/2022 in [facility] Cardiac and Pulmonary Rehab  Referring Provider callwood       Encounter Date: 11/08/2022  Check In:  Session Check In - 11/08/22 1602       Check-In   Supervising physician immediately available to respond to emergencies See telemetry face sheet for immediately available ER MD    Location [facility]-Cardiac & Pulmonary Rehab    Staff Present Bess Kinds RN, BSN;Susanne Bice, RN, BSN, CCRP;Joseph [place], RCP,RRT,BSRT;Kelly [place], BS, ACSM CEP, Exercise Physiologist    Virtual Visit No    Medication changes reported     No    Fall or balance concerns reported    No    Tobacco Cessation No Change    Warm-up and Cool-down Performed on first and last piece of equipment    Resistance Training Performed Yes    VAD Patient? No    PAD/SET Patient? No      Pain Assessment   Currently in Pain? No/denies                Social History   Tobacco Use  Smoking Status Never  Smokeless Tobacco Never    Goals Met:  Independence with exercise equipment Exercise tolerated well No report of concerns or symptoms today Strength training completed today  Goals Unmet:  Not Applicable  Comments: Pt able to follow exercise prescription today without complaint.  Will continue to monitor for progression.    Dr. Bethann Punches is Medical Director for [facility] Cardiac Rehabilitation.  Dr. Vida Rigger is Medical Director for [facility] Pulmonary Rehabilitation.

## 2022-11-13 ENCOUNTER — Encounter: Payer: No Typology Code available for payment source | Admitting: *Deleted

## 2022-11-13 DIAGNOSIS — I252 Old myocardial infarction: Secondary | ICD-10-CM | POA: Diagnosis not present

## 2022-11-13 DIAGNOSIS — I213 ST elevation (STEMI) myocardial infarction of unspecified site: Secondary | ICD-10-CM | POA: Diagnosis not present

## 2022-11-13 DIAGNOSIS — Z955 Presence of coronary angioplasty implant and graft: Secondary | ICD-10-CM

## 2022-11-13 DIAGNOSIS — Z5189 Encounter for other specified aftercare: Secondary | ICD-10-CM | POA: Diagnosis not present

## 2022-11-13 NOTE — Progress Notes (Signed)
Daily Session Note  Patient Details  Name: DENELDA AKERLEY MRN: [id number] Date of Birth: July 02, 1955 Referring Provider:   Flowsheet Row Cardiac Rehab from 09/18/2022 in [facility] Cardiac and Pulmonary Rehab  Referring Provider [place]       Encounter Date: 11/13/2022  Check In:  Session Check In - 11/13/22 1550       Check-In   Supervising physician immediately available to respond to emergencies See telemetry face sheet for immediately available ER MD    Location [facility]-Cardiac & Pulmonary Rehab    Staff Present Susann Givens, RN BSN;Joseph Ribera, RCP,RRT,BSRT;Laureen [place], [place], RRT, CPFT    Virtual Visit No    Medication changes reported     No    Fall or balance concerns reported    No    Warm-up and Cool-down Performed on first and last piece of equipment    Resistance Training Performed Yes    VAD Patient? No    PAD/SET Patient? No      Pain Assessment   Currently in Pain? No/denies                Social History   Tobacco Use  Smoking Status Never  Smokeless Tobacco Never    Goals Met:  Independence with exercise equipment Exercise tolerated well No report of concerns or symptoms today Strength training completed today  Goals Unmet:  Not Applicable  Comments: Pt able to follow exercise prescription today without complaint.  Will continue to monitor for progression.    Dr. Bethann Punches is Medical Director for [facility] Cardiac Rehabilitation.  Dr. Vida Rigger is Medical Director for [facility] Pulmonary Rehabilitation.

## 2022-11-15 ENCOUNTER — Encounter: Payer: Self-pay | Admitting: *Deleted

## 2022-11-15 ENCOUNTER — Encounter: Payer: No Typology Code available for payment source | Admitting: *Deleted

## 2022-11-15 DIAGNOSIS — Z955 Presence of coronary angioplasty implant and graft: Secondary | ICD-10-CM

## 2022-11-15 DIAGNOSIS — I213 ST elevation (STEMI) myocardial infarction of unspecified site: Secondary | ICD-10-CM

## 2022-11-15 DIAGNOSIS — Z5189 Encounter for other specified aftercare: Secondary | ICD-10-CM | POA: Diagnosis not present

## 2022-11-15 DIAGNOSIS — I252 Old myocardial infarction: Secondary | ICD-10-CM | POA: Diagnosis not present

## 2022-11-15 NOTE — Progress Notes (Signed)
Daily Session Note  Patient Details  Name: Stacey Francis MRN: [id number] Date of Birth: 1955/06/28 Referring Provider:   Flowsheet Row Cardiac Rehab from 09/18/2022 in [facility] Cardiac [facility] Rehab  Referring Provider [place]       Encounter Date: 11/15/2022  Check In:  Session Check In - 11/15/22 1539       Check-In   Supervising physician immediately available to respond to emergencies See telemetry face sheet for immediately available ER MD    Location [facility]-Cardiac & Pulmonary Rehab    Staff Present Susann Givens, RN BSN;Joseph [place], RCP,RRT,BSRT;Laureen [place], [place], RRT, CPFT    Virtual Visit No    Medication changes reported     No    Fall or balance concerns reported    No    Warm-up and Cool-down Performed on first and last piece of equipment    Resistance Training Performed Yes    VAD Patient? No    PAD/SET Patient? No      Pain Assessment   Currently in Pain? No/denies                Social History   Tobacco Use  Smoking Status Never  Smokeless Tobacco Never    Goals Met:  Independence with exercise equipment Exercise tolerated well No report of concerns or symptoms today Strength training completed today  Goals Unmet:  Not Applicable  Comments: Pt able to follow exercise prescription today without complaint.  Will continue to monitor for progression.    Dr. Bethann Punches is Medical Director for [facility] Cardiac Rehabilitation.  Dr. Vida Rigger is Medical Director for [facility] Pulmonary Rehabilitation.

## 2022-11-15 NOTE — Progress Notes (Signed)
Cardiac Individual Treatment Plan  Patient Details  Name: Stacey Francis MRN: [id number] Date of Birth: 1955-12-28 Referring Provider:   Flowsheet Row Cardiac Rehab from 09/18/2022 in [facility] Cardiac and Pulmonary Rehab  Referring Provider callwood       Initial Encounter Date:  Flowsheet Row Cardiac Rehab from 09/18/2022 in [facility] Cardiac and Pulmonary Rehab  Date 09/18/22       Visit Diagnosis: ST elevation myocardial infarction (STEMI), unspecified artery [facility])  Status post coronary artery stent placement  Patient's Home Medications on Admission:  Current Outpatient Medications:    aspirin 81 MG chewable tablet, Chew 1 tablet (81 mg total) by mouth daily. (Patient not taking: Reported on 09/11/2022), Disp: 90 tablet, Rfl: 3   atorvastatin (LIPITOR) 80 MG tablet, Take 1 tablet (80 mg total) by mouth daily., Disp: 30 tablet, Rfl: 11   cetirizine (ZYRTEC) 10 MG tablet, Take by mouth., Disp: , Rfl:    clopidogrel (PLAVIX) 75 MG tablet, Take by mouth., Disp: , Rfl:    levothyroxine (SYNTHROID) 25 MCG tablet, Take 25 mcg by mouth daily before breakfast., Disp: , Rfl:    levothyroxine (SYNTHROID) 25 MCG tablet, Take by mouth. (Patient not taking: Reported on 09/11/2022), Disp: , Rfl:    lisinopril (ZESTRIL) 2.5 MG tablet, Take 1 tablet (2.5 mg total) by mouth daily., Disp: 30 tablet, Rfl: 2   metoprolol succinate (TOPROL-XL) 25 MG 24 hr tablet, Take by mouth., Disp: , Rfl:    metoprolol tartrate (LOPRESSOR) 25 MG tablet, Take 0.5 tablets (12.5 mg total) by mouth 2 (two) times daily. (Patient not taking: Reported on 09/11/2022), Disp: 30 tablet, Rfl: 5   metoprolol tartrate (LOPRESSOR) 25 MG tablet, Take by mouth. (Patient not taking: Reported on 09/11/2022), Disp: , Rfl:    naproxen (NAPROSYN) 500 MG tablet, Take 500 mg by mouth 2 (two) times daily., Disp: , Rfl:    nitroGLYCERIN (NITROSTAT) 0.4 MG SL tablet, Place 1 tablet (0.4 mg total) under the tongue every 5 (five) minutes as needed for chest  pain., Disp: 30 tablet, Rfl: 0   ticagrelor (BRILINTA) 90 MG TABS tablet, Take 1 tablet (90 mg total) by mouth 2 (two) times daily. (Patient not taking: Reported on 09/11/2022), Disp: 180 tablet, Rfl: 3  Past Medical History: Past Medical History:  Diagnosis Date   Hypertension    Sinus infection     Tobacco Use: Social History   Tobacco Use  Smoking Status Never  Smokeless Tobacco Never    Labs: Review Flowsheet       Latest Ref Rng & Units 08/13/2022 08/14/2022  Labs for ITP Cardiac and Pulmonary Rehab  Cholestrol 0 - 200 mg/dL 621  -  LDL (calc) 0 - 99 mg/dL 308  -  HDL-C >65 mg/dL 84  -  Trlycerides <784 mg/dL 70  -  Hemoglobin O9G 4.8 - 5.6 % - 5.7      Exercise Target Goals: Exercise Program Goal: Individual exercise prescription set using results from initial 6 min walk test and THRR while considering  patient's activity barriers and safety.   Exercise Prescription Goal: Initial exercise prescription builds to 30-45 minutes a day of aerobic activity, 2-3 days per week.  Home exercise guidelines will be given to patient during program as part of exercise prescription that the participant will acknowledge.   Education: Aerobic Exercise: - Group verbal and visual presentation on the components of exercise prescription. Introduces F.I.T.T principle from ACSM for exercise prescriptions.  Reviews F.I.T.T. principles of aerobic exercise including  progression. Written material given at graduation. Flowsheet Row Cardiac Rehab from 11/08/2022 in [facility] Cardiac and Pulmonary Rehab  Education need identified 09/18/22  Date 11/08/22  Educator [facility]  Instruction Review Code 1- [employer] Understanding       Education: Resistance Exercise: - Group verbal and visual presentation on the components of exercise prescription. Introduces F.I.T.T principle from ACSM for exercise prescriptions  Reviews F.I.T.T. principles of resistance exercise including progression. Written material given  at graduation.    Education: Exercise & Equipment Safety: - Individual verbal instruction and demonstration of equipment use and safety with use of the equipment. Flowsheet Row Cardiac Rehab from 11/08/2022 in [facility] Cardiac and Pulmonary Rehab  Date 09/18/22  Educator [facility]  Instruction Review Code 1- Verbalizes Understanding       Education: Exercise Physiology & General Exercise Guidelines: - Group verbal and written instruction with models to review the exercise physiology of the cardiovascular system and associated critical values. Provides general exercise guidelines with specific guidelines to those with heart or lung disease.  Flowsheet Row Cardiac Rehab from 11/08/2022 in [facility] Cardiac and Pulmonary Rehab  Education need identified 09/18/22  Date 11/01/22  Educator [facility]  Instruction Review Code 1- [employer] Understanding       Education: Flexibility, Balance, Mind/Body Relaxation: - Group verbal and visual presentation with interactive activity on the components of exercise prescription. Introduces F.I.T.T principle from ACSM for exercise prescriptions. Reviews F.I.T.T. principles of flexibility and balance exercise training including progression. Also discusses the mind body connection.  Reviews various relaxation techniques to help reduce and manage stress (i.e. Deep breathing, progressive muscle relaxation, and visualization). Balance handout provided to take home. Written material given at graduation.   Activity Barriers & Risk Stratification:  Activity Barriers & Cardiac Risk Stratification - 09/18/22 1652       Activity Barriers & Cardiac Risk Stratification   Activity Barriers None    Cardiac Risk Stratification High             6 Minute Walk:  6 Minute Walk     Row Name 09/18/22 1651         6 Minute Walk   Phase Initial     Distance 1135 feet     Walk Time 6 minutes     # of Rest Breaks 0     MPH 2.15     METS 3.18     RPE 9     Perceived Dyspnea  0      VO2 Peak 11.13     Symptoms No     Resting HR 83 bpm     Resting BP 132/82     Resting Oxygen Saturation  99 %     Exercise Oxygen Saturation  during 6 min walk 97 %     Max Ex. HR 111 bpm     Max Ex. BP 130/82     2 Minute Post BP 118/62              Oxygen Initial Assessment:   Oxygen Re-Evaluation:   Oxygen Discharge (Final Oxygen Re-Evaluation):   Initial Exercise Prescription:  Initial Exercise Prescription - 09/18/22 1600       Date of Initial Exercise RX and Referring Provider   Date 09/18/22    Referring Provider callwood      Oxygen   Maintain Oxygen Saturation 88% or higher      Treadmill   MPH 2    Grade 1    Minutes 15  METs 2.81      Recumbant Bike   Level 2    RPM 50    Watts 20    Minutes 15    METs 3.18      NuStep   Level 2    SPM 80    Minutes 15    METs 3.18      REL-XR   Level 2    Speed 50    Minutes 15    METs 3.18      T5 Nustep   Level 1    SPM 80    Minutes 15    METs 3.18      Prescription Details   Frequency (times per week) 3    Duration Progress to 30 minutes of continuous aerobic without signs/symptoms of physical distress      Intensity   THRR 40-80% of Max Heartrate 111-139    Ratings of Perceived Exertion 11-13    Perceived Dyspnea 0-4      Progression   Progression Continue to progress workloads to maintain intensity without signs/symptoms of physical distress.      Resistance Training   Training Prescription Yes    Weight 3    Reps 10-15             Perform Capillary Blood Glucose checks as needed.  Exercise Prescription Changes:   Exercise Prescription Changes     Row Name 09/18/22 1600 10/09/22 1500 10/11/22 1600 10/24/22 0700 11/07/22 0800     Response to Exercise   Blood Pressure (Admit) 132/82 126/68 -- 118/60 112/64   Blood Pressure (Exercise) 130/82 146/82 -- 148/60 --   Blood Pressure (Exit) 118/62 112/62 -- 112/64 102/60   Heart Rate (Admit) 83 bpm 63 bpm -- 85 bpm 84  bpm   Heart Rate (Exercise) 111 bpm 111 bpm -- 122 bpm 119 bpm   Heart Rate (Exit) 80 bpm 82 bpm -- 94 bpm 97 bpm   Oxygen Saturation (Admit) 99 % -- -- -- --   Oxygen Saturation (Exercise) 97 % -- -- -- --   Oxygen Saturation (Exit) 100 % -- -- -- --   Rating of Perceived Exertion (Exercise) 9 13 -- 13 13   Perceived Dyspnea (Exercise) 0 -- -- -- --   Symptoms none none -- none none   Comments 6 MWT results 2nd full day of exercise -- -- --   Duration -- Progress to 30 minutes of  aerobic without signs/symptoms of physical distress -- Continue with 30 min of aerobic exercise without signs/symptoms of physical distress. Continue with 30 min of aerobic exercise without signs/symptoms of physical distress.   Intensity -- THRR unchanged -- THRR unchanged THRR unchanged     Progression   Progression -- Continue to progress workloads to maintain intensity without signs/symptoms of physical distress. -- Continue to progress workloads to maintain intensity without signs/symptoms of physical distress. Continue to progress workloads to maintain intensity without signs/symptoms of physical distress.   Average METs -- 2.57 -- 2.78 3.03     Resistance Training   Training Prescription -- Yes -- Yes Yes   Weight -- 3 lb -- 3 lb 3 lb   Reps -- 10-15 -- 10-15 10-15     Interval Training   Interval Training -- No -- No No     Treadmill   MPH -- -- -- 1.5 --   Grade -- -- -- 0.5 --   Minutes -- -- -- 15 --   METs -- -- --  2.25 --     Recumbant Bike   Level -- 1.2 -- 2 2   Watts -- 20 -- 20 --   Minutes -- 15 -- 15 15   METs -- 2.6 -- 2.4 3.4     NuStep   Level -- 4 -- 4 5   Minutes -- 15 -- 15 15   METs -- 2.7 -- 3 3.7     Biostep-RELP   Level -- -- -- 2 2   Minutes -- -- -- 15 15   METs -- -- -- 3 2     Home Exercise Plan   Plans to continue exercise at -- -- Home (comment)  Walking, weights, yoga Home (comment)  Walking, weights, yoga Home (comment)  Walking, weights, yoga    Frequency -- -- Add 2 additional days to program exercise sessions. Add 2 additional days to program exercise sessions. Add 2 additional days to program exercise sessions.   Initial Home Exercises Provided -- -- 10/11/22 10/11/22 10/11/22     Oxygen   Maintain Oxygen Saturation -- 88% or higher 88% or higher 88% or higher 88% or higher            Exercise Comments:   Exercise Comments     Row Name 09/25/22 1600           Exercise Comments First full day of exercise!  Patient was oriented to gym and equipment including functions, settings, policies, and procedures.  Patient's individual exercise prescription and treatment plan were reviewed.  All starting workloads were established based on the results of the 6 minute walk test done at initial orientation visit.  The plan for exercise progression was also introduced and progression will be customized based on patient's performance and goals.                Exercise Goals and Review:   Exercise Goals     Row Name 09/18/22 1655             Exercise Goals   Increase Physical Activity Yes       Intervention Provide advice, education, support and counseling about physical activity/exercise needs.;Develop an individualized exercise prescription for aerobic and resistive training based on initial evaluation findings, risk stratification, comorbidities and participant's personal goals.       Expected Outcomes Short Term: Attend rehab on a regular basis to increase amount of physical activity.;Long Term: Add in home exercise to make exercise part of routine and to increase amount of physical activity.;Long Term: Exercising regularly at least 3-5 days a week.       Increase Strength and Stamina Yes       Intervention Provide advice, education, support and counseling about physical activity/exercise needs.;Develop an individualized exercise prescription for aerobic and resistive training based on initial evaluation findings, risk  stratification, comorbidities and participant's personal goals.       Expected Outcomes Short Term: Increase workloads from initial exercise prescription for resistance, speed, and METs.;Short Term: Perform resistance training exercises routinely during rehab and add in resistance training at home;Long Term: Improve cardiorespiratory fitness, muscular endurance and strength as measured by increased METs and functional capacity ( )       Able to understand and use rate of perceived exertion (RPE) scale Yes       Intervention Provide education and explanation on how to use RPE scale       Expected Outcomes Short Term: Able to use RPE daily in rehab to express subjective intensity  level;Long Term:  Able to use RPE to guide intensity level when exercising independently       Able to understand and use Dyspnea scale Yes       Intervention Provide education and explanation on how to use Dyspnea scale       Expected Outcomes Short Term: Able to use Dyspnea scale daily in rehab to express subjective sense of shortness of breath during exertion;Long Term: Able to use Dyspnea scale to guide intensity level when exercising independently       Knowledge and understanding of Target Heart Rate Range (THRR) Yes       Intervention Provide education and explanation of THRR including how the numbers were predicted and where they are located for reference       Expected Outcomes Short Term: Able to state/look up THRR;Long Term: Able to use THRR to govern intensity when exercising independently;Short Term: Able to use daily as guideline for intensity in rehab       Able to check pulse independently Yes       Intervention Provide education and demonstration on how to check pulse in carotid and radial arteries.;Review the importance of being able to check your own pulse for safety during independent exercise       Expected Outcomes Short Term: Able to explain why pulse checking is important during independent exercise        Understanding of Exercise Prescription Yes       Intervention Provide education, explanation, and written materials on patient's individual exercise prescription       Expected Outcomes Short Term: Able to explain program exercise prescription;Long Term: Able to explain home exercise prescription to exercise independently                Exercise Goals Re-Evaluation :  Exercise Goals Re-Evaluation     Row Name 09/25/22 1601 10/09/22 1545 10/09/22 1555 10/11/22 1609 10/24/22 0738     Exercise Goal Re-Evaluation   Exercise Goals Review Increase Physical Activity;Knowledge and understanding of Target Heart Rate Range (THRR);Able to understand and use rate of perceived exertion (RPE) scale;Understanding of Exercise Prescription;Increase Strength and Stamina;Able to check pulse independently Increase Physical Activity;Increase Strength and Stamina;Understanding of Exercise Prescription Increase Physical Activity;Increase Strength and Stamina;Understanding of Exercise Prescription Able to understand and use Dyspnea scale;Understanding of Exercise Prescription;Knowledge and understanding of Target Heart Rate Range (THRR);Able to check pulse independently;Able to understand and use rate of perceived exertion (RPE) scale;Increase Strength and Stamina;Increase Physical Activity Increase Physical Activity;Increase Strength and Stamina;Understanding of Exercise Prescription   Comments Reviewed RPE scale, THR and program prescription with pt today.  Pt voiced understanding and was given a copy of goals to take home. Concepsion is off to a good start in rehab.  She is doing some yoga at home on her off days.  She also does a little resistance training and walking as well. We will review home exercise guidelines with her once she has been coming consistently for a bit.  She was encouraged to maintain what she was currently doing. Stacey Francis has done well with her first couple of sessions of rehab. She has started off  with her initial exercise prescription with appropriate RPEs. She did hit her THR the 2 sessions she was here and we hope to see that kept up over her time here at rehab. Will continue to monitor as she progresses. Reviewed home exercise with pt today.  Pt plans to walk at home, use weights, and do yoga for  exercise.  Reviewed THR, pulse, RPE, sign and symptoms, pulse oximetery and when to call 911 or MD.  Also discussed weather considerations and indoor options.  Pt voiced understanding. Stacey Francis is doing well in rehab. She recently increased her overall average MET level to 2.78 METs. She also walked the treadmill once since the last review at a speed of 1.5 mph and an incline of 0.5%. She may benefit from walking in the program more consistently. She also has kept her workloads consistent on the recumbent bike, T4 nustep, and biostep. We will continue to monitor her progress in the program.   Expected Outcomes Short: Use RPE daily to regulate intensity.  Long: Follow program prescription in THR. Short: Review home exercise Long: Conitnue to attend rehab to build stamina Short: Continue initial exercise prescription Long: Improve overall strength and stamina Short: Walk at home on days away from rehab. Long: Continue to improve strength and stamina. Short: Walk more consistently in rehab, progressively increase workloads. Long: Continue to increase overall MET level and stamina.    Row Name 11/02/22 1603 11/07/22 0815           Exercise Goal Re-Evaluation   Exercise Goals Review Increase Physical Activity;Increase Strength and Stamina;Understanding of Exercise Prescription Increase Physical Activity;Increase Strength and Stamina;Understanding of Exercise Prescription      Comments Stacey Francis is doing well with exercise. She feels her stamina has improved as she is able to exercise for longer bouts. She reports that she tries to push herself more while she is at rehab because she knows she is being monitored.  She also states that she has been walking at home on days that she doesn't come to rehab. She has kettlebell weights that she uses for resistance training at home as well. Stacey Francis is doing well in rehab.  She is up to level 5 on the NuStep.  We will encourage her to continue to build up workloads and weights.  We will conitnue to monitor her progress.      Expected Outcomes Short: Continue to walk on days away from rehab. Long: Continue to improve strength and stamina. Short: Increase hand weights Long: Continue to improve stamina               Discharge Exercise Prescription (Final Exercise Prescription Changes):  Exercise Prescription Changes - 11/07/22 0800       Response to Exercise   Blood Pressure (Admit) 112/64    Blood Pressure (Exit) 102/60    Heart Rate (Admit) 84 bpm    Heart Rate (Exercise) 119 bpm    Heart Rate (Exit) 97 bpm    Rating of Perceived Exertion (Exercise) 13    Symptoms none    Duration Continue with 30 min of aerobic exercise without signs/symptoms of physical distress.    Intensity THRR unchanged      Progression   Progression Continue to progress workloads to maintain intensity without signs/symptoms of physical distress.    Average METs 3.03      Resistance Training   Training Prescription Yes    Weight 3 lb    Reps 10-15      Interval Training   Interval Training No      Recumbant Bike   Level 2    Minutes 15    METs 3.4      NuStep   Level 5    Minutes 15    METs 3.7      Biostep-RELP   Level 2    Minutes  15    METs 2      Home Exercise Plan   Plans to continue exercise at Home (comment)   Walking, weights, yoga   Frequency Add 2 additional days to program exercise sessions.    Initial Home Exercises Provided 10/11/22      Oxygen   Maintain Oxygen Saturation 88% or higher             Nutrition:  Target Goals: Understanding of nutrition guidelines, daily intake of sodium 1500mg , cholesterol 200mg , calories 30% from fat  and 7% or less from saturated fats, daily to have 5 or more servings of fruits and vegetables.  Education: All About Nutrition: -Group instruction provided by verbal, written material, interactive activities, discussions, models, and posters to present general guidelines for heart healthy nutrition including fat, fiber, MyPlate, the role of sodium in heart healthy nutrition, utilization of the nutrition label, and utilization of this knowledge for meal planning. Follow up email sent as well. Written material given at graduation. Flowsheet Row Cardiac Rehab from 11/08/2022 in [facility] Cardiac and Pulmonary Rehab  Education need identified 09/18/22       Biometrics:  Pre Biometrics - 09/18/22 1656       Pre Biometrics   Height 5' 3.5" (1.613 m)    Weight 116 lb 11.2 oz (52.9 kg)    Waist Circumference 28 inches    Hip Circumference 36.5 inches    Waist to Hip Ratio 0.77 %    BMI (Calculated) 20.35    Single Leg Stand 30 seconds              Nutrition Therapy Plan and Nutrition Goals:  Nutrition Therapy & Goals - 09/18/22 1657       Intervention Plan   Intervention Prescribe, educate and counsel regarding individualized specific dietary modifications aiming towards targeted core components such as weight, hypertension, lipid management, diabetes, heart failure and other comorbidities.    Expected Outcomes Short Term Goal: Understand basic principles of dietary content, such as calories, fat, sodium, cholesterol and nutrients.;Short Term Goal: A plan has been developed with personal nutrition goals set during dietitian appointment.;Long Term Goal: Adherence to prescribed nutrition plan.             Nutrition Assessments:  MEDIFICTS Score Key: ?70 Need to make dietary changes  40-70 Heart Healthy Diet ? 40 Therapeutic Level Cholesterol Diet  Flowsheet Row Cardiac Rehab from 09/18/2022 in [facility] Cardiac and Pulmonary Rehab  Picture Your Plate Total Score on Admission 82       Picture Your Plate Scores: <16 Unhealthy dietary pattern with much room for improvement. 41-50 Dietary pattern unlikely to meet recommendations for good health and room for improvement. 51-60 More healthful dietary pattern, with some room for improvement.  >60 Healthy dietary pattern, although there may be some specific behaviors that could be improved.    Nutrition Goals Re-Evaluation:  Nutrition Goals Re-Evaluation     Row Name 10/09/22 1551 11/02/22 1623           Goals   Comment Stacey Francis is off to a good start in rehab  She was not able to meet with dietitian before they left but would like once new one starts.  She feels that she is already doing well on her heart healthy diet.  She avoids processed foods and usually sticks to organics. She eats lots of fruits and vegetables.  She also has cut way back on sugar but will treat herself on occassion. Stacey Francis was not  able to speak with the RD before she left. However, she states that she is interested in speaking with the new RD about some heart healthy eating patterns once he starts.      Expected Outcome Short: Continue with diet Long: Continue to avoid sugar Short: Speak with new RD. Long: Continue to practice heart healthy eating patterns.               Nutrition Goals Discharge (Final Nutrition Goals Re-Evaluation):  Nutrition Goals Re-Evaluation - 11/02/22 1623       Goals   Comment Stacey Francis was not able to speak with the RD before she left. However, she states that she is interested in speaking with the new RD about some heart healthy eating patterns once he starts.    Expected Outcome Short: Speak with new RD. Long: Continue to practice heart healthy eating patterns.             Psychosocial: Target Goals: Acknowledge presence or absence of significant depression and/or stress, maximize coping skills, provide positive support system. Participant is able to verbalize types and ability to use techniques and skills  needed for reducing stress and depression.   Education: Stress, Anxiety, and Depression - Group verbal and visual presentation to define topics covered.  Reviews how body is impacted by stress, anxiety, and depression.  Also discusses healthy ways to reduce stress and to treat/manage anxiety and depression.  Written material given at graduation. Flowsheet Row Cardiac Rehab from 11/08/2022 in [facility] Cardiac and Pulmonary Rehab  Date 10/25/22  Educator KW  Instruction Review Code 1- [employer] Understanding       Education: Sleep Hygiene -Provides group verbal and written instruction about how sleep can affect your health.  Define sleep hygiene, discuss sleep cycles and impact of sleep habits. Review good sleep hygiene tips.    Initial Review & Psychosocial Screening:  Initial Psych Review & Screening - 09/11/22 1025       Initial Review   Current issues with Current Stress Concerns    Source of Stress Concerns Chronic Illness    Comments She has been more frustrated with her limitations and her health. She is not able to do the things that she wants to do. For the past two years she has been trying to find out what is wrong with her and did not find it until now. Her doctor told her she still has a blockage and cant to anything about it now.      Family Dynamics   Good Support System? Yes    Comments Stacey Francis can look to her four kids for support and are helpful.             Quality of Life Scores:   Quality of Life - 09/18/22 1658       Quality of Life   Select Quality of Life      Quality of Life Scores   Health/Function Pre 14.07 %    Socioeconomic Pre 26.08 %    Psych/Spiritual Pre 25 %    Family Pre 28.5 %    GLOBAL Pre 20.58 %            Scores of 19 and below usually indicate a poorer quality of life in these areas.  A difference of  2-3 points is a clinically meaningful difference.  A difference of 2-3 points in the total score of the Quality of Life Index has  been associated with significant improvement in overall quality of life,  self-image, physical symptoms, and general health in studies assessing change in quality of life.  PHQ-9: Review Flowsheet       09/18/2022  Depression screen PHQ 2/9  Decreased Interest 0  Down, Depressed, Hopeless 0  PHQ - 2 Score 0  Altered sleeping 0  Tired, decreased energy 1  Change in appetite 0  Feeling bad or failure about yourself  0  Trouble concentrating 0  Moving slowly or fidgety/restless 0  Suicidal thoughts 0  PHQ-9 Score 1  Difficult doing work/chores Not difficult at all   Interpretation of Total Score  Total Score Depression Severity:  1-4 = Minimal depression, 5-9 = Mild depression, 10-14 = Moderate depression, 15-19 = Moderately severe depression, 20-27 = Severe depression   Psychosocial Evaluation and Intervention:  Psychosocial Evaluation - 09/11/22 1032       Psychosocial Evaluation & Interventions   Interventions Encouraged to exercise with the program and follow exercise prescription;Relaxation education;Stress management education    Comments She has been more frustrated with her limitations and her health. She is not able to do the things that she wants to do. For the past two years she has been trying to find out what is wrong with her and did not find it until now. Her doctor told her she still has a blockage and cant to anything about it now.Kam can look to her four kids for support and are helpful.    Expected Outcomes Short: Start [facility] to help with mood. Long: Maintain a healthy mental state    Continue Psychosocial Services  Follow up required by staff             Psychosocial Re-Evaluation:  Psychosocial Re-Evaluation     Row Name 10/09/22 1548 11/02/22 1607           Psychosocial Re-Evaluation   Current issues with Current Stress Concerns Current Stress Concerns      Comments Stacey Francis is doing well in rehab so far.  She is doing well mentally for most  part.  She has days that she gets really frustrated with her fatigue levels and not being able to go and do like she used to.  She wants to continue to build back up to get back to being active again. She is sleeping pretty good most of the time.  She will wake about once a night, but usually able to get back to sleep easily. She is still concerned about her LAD not being fully functional, and has just had more testing with it. Stacey Francis is doing well mentally. She states that she has not been dealing with any major stressors at this time. She has just been frustrated with her cardiac health concerns. She states that she enjoys gardening and being in nature with her dogs for stress relief. She reports that her 4 children, friends, and her dogs are a good support for her. She reports that she is sleeping well, although she does wake up in the middle of the night occasionally.      Expected Outcomes Short: Follow up on artery workup and exercise for mental boost Long: Continue to build back up to being able to go and do Short: Continue to find healthy ways to relieve stress. Long: Continue to maintain positive outlook.      Interventions Encouraged to attend Cardiac Rehabilitation for the exercise Encouraged to attend Cardiac Rehabilitation for the exercise      Continue Psychosocial Services  Follow up required by staff Follow up required  by staff        Initial Review   Source of Stress Concerns Unable to participate in former interests or hobbies;Unable to perform yard/household activities Chronic Illness               Psychosocial Discharge (Final Psychosocial Re-Evaluation):  Psychosocial Re-Evaluation - 11/02/22 1607       Psychosocial Re-Evaluation   Current issues with Current Stress Concerns    Comments Stacey Francis is doing well mentally. She states that she has not been dealing with any major stressors at this time. She has just been frustrated with her cardiac health concerns. She states that  she enjoys gardening and being in nature with her dogs for stress relief. She reports that her 4 children, friends, and her dogs are a good support for her. She reports that she is sleeping well, although she does wake up in the middle of the night occasionally.    Expected Outcomes Short: Continue to find healthy ways to relieve stress. Long: Continue to maintain positive outlook.    Interventions Encouraged to attend Cardiac Rehabilitation for the exercise    Continue Psychosocial Services  Follow up required by staff      Initial Review   Source of Stress Concerns Chronic Illness             Vocational Rehabilitation: Provide vocational rehab assistance to qualifying candidates.   Vocational Rehab Evaluation & Intervention:   Education: Education Goals: Education classes will be provided on a variety of topics geared toward better understanding of heart health and risk factor modification. Participant will state understanding/return demonstration of topics presented as noted by education test scores.  Learning Barriers/Preferences:  Learning Barriers/Preferences - 09/11/22 1024       Learning Barriers/Preferences   Learning Barriers None    Learning Preferences None             General Cardiac Education Topics:  AED/CPR: - Group verbal and written instruction with the use of models to demonstrate the basic use of the AED with the basic ABC's of resuscitation.   Anatomy and Cardiac Procedures: - Group verbal and visual presentation and models provide information about basic cardiac anatomy and function. Reviews the testing methods done to diagnose heart disease and the outcomes of the test results. Describes the treatment choices: Medical Management, Angioplasty, or Coronary Bypass Surgery for treating various heart conditions including Myocardial Infarction, Angina, Valve Disease, and Cardiac Arrhythmias.  Written material given at graduation.   Medication Safety: -  Group verbal and visual instruction to review commonly prescribed medications for heart and lung disease. Reviews the medication, class of the drug, and side effects. Includes the steps to properly store meds and maintain the prescription regimen.  Written material given at graduation. Flowsheet Row Cardiac Rehab from 11/08/2022 in [facility] Cardiac and Pulmonary Rehab  Date 10/04/22  Educator SB  Instruction Review Code 1- Verbalizes Understanding       Intimacy: - Group verbal instruction through game format to discuss how heart and lung disease can affect sexual intimacy. Written material given at graduation.. Flowsheet Row Cardiac Rehab from 11/08/2022 in [facility] Cardiac and Pulmonary Rehab  Date 11/08/22  Educator [facility]  Instruction Review Code 1- Verbalizes Understanding       Know Your Numbers and Heart Failure: - Group verbal and visual instruction to discuss disease risk factors for cardiac and pulmonary disease and treatment options.  Reviews associated critical values for Overweight/Obesity, Hypertension, Cholesterol, and Diabetes.  Discusses basics of  heart failure: signs/symptoms and treatments.  Introduces Heart Failure Zone chart for action plan for heart failure.  Written material given at graduation. Flowsheet Row Cardiac Rehab from 11/08/2022 in [facility] Cardiac and Pulmonary Rehab  Date 10/11/22  Educator MS  Instruction Review Code 1- Verbalizes Understanding       Infection Prevention: - Provides verbal and written material to individual with discussion of infection control including proper hand washing and proper equipment cleaning during exercise session. Flowsheet Row Cardiac Rehab from 11/08/2022 in [facility] Cardiac and Pulmonary Rehab  Date 09/18/22  Educator [facility]  Instruction Review Code 1- Verbalizes Understanding       Falls Prevention: - Provides verbal and written material to individual with discussion of falls prevention and safety. Flowsheet Row Cardiac Rehab from  11/08/2022 in [facility] Cardiac and Pulmonary Rehab  Date 09/18/22  Educator [facility]  Instruction Review Code 1- Verbalizes Understanding       Other: -Provides group and verbal instruction on various topics (see comments)   Knowledge Questionnaire Score:  Knowledge Questionnaire Score - 09/18/22 1658       Knowledge Questionnaire Score   Pre Score 23/26             Core Components/Risk Factors/Patient Goals at Admission:  Personal Goals and Risk Factors at Admission - 09/18/22 1657       Core Components/Risk Factors/Patient Goals on Admission    Weight Management Yes;Weight Gain    Intervention Weight Management: Develop a combined nutrition and exercise program designed to reach desired caloric intake, while maintaining appropriate intake of nutrient and fiber, sodium and fats, and appropriate energy expenditure required for the weight goal.;Weight Management: Provide education and appropriate resources to help participant work on and attain dietary goals.;Weight Management/Obesity: Establish reasonable short term and long term weight goals.    Admit Weight 116 lb 11.2 oz (52.9 kg)    Goal Weight: Short Term 120 lb (54.4 kg)    Goal Weight: Long Term 120 lb (54.4 kg)    Expected Outcomes Short Term: Continue to assess and modify interventions until short term weight is achieved;Long Term: Adherence to nutrition and physical activity/exercise program aimed toward attainment of established weight goal;Weight Maintenance: Understanding of the daily nutrition guidelines, which includes 25-35% calories from fat, 7% or less cal from saturated fats, less than 200mg  cholesterol, less than 1.5gm of sodium, & 5 or more servings of fruits and vegetables daily;Understanding recommendations for meals to include 15-35% energy as protein, 25-35% energy from fat, 35-60% energy from carbohydrates, less than 200mg  of dietary cholesterol, 20-35 gm of total fiber daily;Understanding of distribution of calorie  intake throughout the day with the consumption of 4-5 meals/snacks;Weight Gain: Understanding of general recommendations for a high calorie, high protein meal plan that promotes weight gain by distributing calorie intake throughout the day with the consumption for 4-5 meals, snacks, and/or supplements    Hypertension Yes    Intervention Provide education on lifestyle modifcations including regular physical activity/exercise, weight management, moderate sodium restriction and increased consumption of fresh fruit, vegetables, and low fat dairy, alcohol moderation, and smoking cessation.;Monitor prescription use compliance.    Expected Outcomes Short Term: Continued assessment and intervention until BP is < 140/56mm HG in hypertensive participants. < 130/85mm HG in hypertensive participants with diabetes, heart failure or chronic kidney disease.;Long Term: Maintenance of blood pressure at goal levels.    Lipids Yes    Intervention Provide education and support for participant on nutrition & aerobic/resistive exercise along with prescribed medications  to achieve LDL 70mg , HDL >40mg .    Expected Outcomes Short Term: Participant states understanding of desired cholesterol values and is compliant with medications prescribed. Participant is following exercise prescription and nutrition guidelines.;Long Term: Cholesterol controlled with medications as prescribed, with individualized exercise RX and with personalized nutrition plan. Value goals: LDL < 70mg , HDL > 40 mg.             Education:Diabetes - Individual verbal and written instruction to review signs/symptoms of diabetes, desired ranges of glucose level fasting, after meals and with exercise. Acknowledge that pre and post exercise glucose checks will be done for 3 sessions at entry of program.   Core Components/Risk Factors/Patient Goals Review:   Goals and Risk Factor Review     Row Name 10/09/22 1555 11/02/22 1613           Core  Components/Risk Factors/Patient Goals Review   Personal Goals Review Weight Management/Obesity;Hypertension Weight Management/Obesity;Hypertension      Review Stacey Francis is off to a good start in rehab.  Her weight is staying fairly steady between 115-118 lb.  Her blood pressures are doing well overall, on the upper end of normal.  She is checking them at home.  She is usually in the 130s but has gotten up to 180s on occassion.  She was encouraged to bring her cuff into class to check again what we are getting as well. Stacey Francis is happy with where her weight is at this time and states that she feels she is at her optimal weight. She would like to maintain her weight right around 120 lbs. She is checking her BP at home and reports that they are staying within normal ranges. She states that her systolic BP has been between 120-135 and her diastolic BP has been between 70-80. Her BP has been within normal ranges in rehab as well.      Expected Outcomes Short: Bring in blood pressure cuff to check Long: conitnue to maintain weight Short: Continue to monitor BP at home. Long: Continue to maintain weight.               Core Components/Risk Factors/Patient Goals at Discharge (Final Review):   Goals and Risk Factor Review - 11/02/22 1613       Core Components/Risk Factors/Patient Goals Review   Personal Goals Review Weight Management/Obesity;Hypertension    Review Stacey Francis is happy with where her weight is at this time and states that she feels she is at her optimal weight. She would like to maintain her weight right around 120 lbs. She is checking her BP at home and reports that they are staying within normal ranges. She states that her systolic BP has been between 120-135 and her diastolic BP has been between 70-80. Her BP has been within normal ranges in rehab as well.    Expected Outcomes Short: Continue to monitor BP at home. Long: Continue to maintain weight.             ITP Comments:  ITP  Comments     Row Name 09/11/22 1024 09/18/22 1650 09/20/22 1424 09/25/22 1600 10/18/22 0945   ITP Comments Virtual Visit completed. Patient informed on EP and RD appointment and 6 Minute walk test. Patient also informed of patient health questionnaires on My Chart. Patient Verbalizes understanding. Visit diagnosis can be found in [facility] 08/13/2022. Completed and gym orientation. Initial ITP created and sent for review to Dr. Bethann Punches, Medical Director. 30 day review completed. ITP sent to Dr.  Bethann Punches, Medical Director of Cardiac Rehab. Continue with ITP unless changes are made by physician.   Pt new to program. First full day of exercise!  Patient was oriented to gym and equipment including functions, settings, policies, and procedures.  Patient's individual exercise prescription and treatment plan were reviewed.  All starting workloads were established based on the results of the 6 minute walk test done at initial orientation visit.  The plan for exercise progression was also introduced and progression will be customized based on patient's performance and goals. 30 Day review completed. Medical Director ITP review done, changes made as directed, and signed approval by Medical Director.    new to program    Row Name 11/15/22 1137           ITP Comments 30 Day review completed. Medical Director ITP review done, changes made as directed, and signed approval by Medical Director.                Comments:

## 2022-11-16 ENCOUNTER — Encounter: Payer: No Typology Code available for payment source | Admitting: *Deleted

## 2022-11-16 DIAGNOSIS — I213 ST elevation (STEMI) myocardial infarction of unspecified site: Secondary | ICD-10-CM

## 2022-11-16 DIAGNOSIS — I252 Old myocardial infarction: Secondary | ICD-10-CM | POA: Diagnosis not present

## 2022-11-16 DIAGNOSIS — Z5189 Encounter for other specified aftercare: Secondary | ICD-10-CM | POA: Diagnosis not present

## 2022-11-16 DIAGNOSIS — Z955 Presence of coronary angioplasty implant and graft: Secondary | ICD-10-CM

## 2022-11-16 NOTE — Progress Notes (Signed)
Daily Session Note  Patient Details  Name: MONTINA DORRANCE MRN: [id number] Date of Birth: 04/10/1956 Referring Provider:   Flowsheet Row Cardiac Rehab from 09/18/2022 in [facility] Cardiac and Pulmonary Rehab  Referring Provider callwood       Encounter Date: 11/16/2022  Check In:  Session Check In - 11/16/22 1532       Check-In   Supervising physician immediately available to respond to emergencies See telemetry face sheet for immediately available ER MD    Location [facility]-Cardiac & Pulmonary Rehab    Staff Present Susann Givens, RN BSN;Joseph Toronto, RCP,RRT,BSRT;Jessica Ben [place], [place], RCEP, CCRP, CCET    Virtual Visit No    Medication changes reported     No    Fall or balance concerns reported    No    Warm-up and Cool-down Performed on first and last piece of equipment    Resistance Training Performed Yes    VAD Patient? No    PAD/SET Patient? No      Pain Assessment   Currently in Pain? No/denies                Social History   Tobacco Use  Smoking Status Never  Smokeless Tobacco Never    Goals Met:  Independence with exercise equipment Exercise tolerated well No report of concerns or symptoms today Strength training completed today  Goals Unmet:  Not Applicable  Comments: Pt able to follow exercise prescription today without complaint.  Will continue to monitor for progression.    Dr. Bethann Punches is Medical Director for [facility] Cardiac Rehabilitation.  Dr. Vida Rigger is Medical Director for [facility] Pulmonary Rehabilitation.

## 2022-11-22 ENCOUNTER — Encounter: Payer: No Typology Code available for payment source | Admitting: *Deleted

## 2022-11-22 DIAGNOSIS — Z955 Presence of coronary angioplasty implant and graft: Secondary | ICD-10-CM

## 2022-11-22 DIAGNOSIS — Z5189 Encounter for other specified aftercare: Secondary | ICD-10-CM | POA: Diagnosis not present

## 2022-11-22 DIAGNOSIS — I252 Old myocardial infarction: Secondary | ICD-10-CM | POA: Diagnosis not present

## 2022-11-22 DIAGNOSIS — I213 ST elevation (STEMI) myocardial infarction of unspecified site: Secondary | ICD-10-CM | POA: Diagnosis not present

## 2022-11-22 NOTE — Progress Notes (Signed)
Daily Session Note  Patient Details  Name: Stacey Francis MRN: [id number] Date of Birth: [birth year] Referring Provider:   Flowsheet Row Cardiac Rehab from 09/18/2022 in [facility] Cardiac and Pulmonary Rehab  Referring Provider callwood       Encounter Date: 11/22/2022  Check In:  Session Check In - 11/22/22 1542       Check-In   Supervising physician immediately available to respond to emergencies See telemetry face sheet for immediately available ER MD    Location [facility]-Cardiac & Pulmonary Rehab    Staff Present Susann Givens, RN BSN;Joseph Reino [place], RCP,RRT,BSRT;Megan Katrinka Blazing, RN, [place]    Virtual Visit No    Medication changes reported     No    Fall or balance concerns reported    No    Warm-up and Cool-down Performed on first and last piece of equipment    Resistance Training Performed Yes    VAD Patient? No    PAD/SET Patient? No      Pain Assessment   Currently in Pain? No/denies                Social History   Tobacco Use  Smoking Status Never  Smokeless Tobacco Never    Goals Met:  Independence with exercise equipment Exercise tolerated well No report of concerns or symptoms today Strength training completed today  Goals Unmet:  Not Applicable  Comments: Pt able to follow exercise prescription today without complaint.  Will continue to monitor for progression.    Dr. Bethann Punches is Medical Director for [facility] Cardiac Rehabilitation.  Dr. Vida Rigger is Medical Director for [facility] Pulmonary Rehabilitation.

## 2022-11-23 ENCOUNTER — Encounter: Payer: No Typology Code available for payment source | Admitting: *Deleted

## 2022-11-23 DIAGNOSIS — Z5189 Encounter for other specified aftercare: Secondary | ICD-10-CM | POA: Diagnosis not present

## 2022-11-23 DIAGNOSIS — I213 ST elevation (STEMI) myocardial infarction of unspecified site: Secondary | ICD-10-CM

## 2022-11-23 DIAGNOSIS — Z955 Presence of coronary angioplasty implant and graft: Secondary | ICD-10-CM

## 2022-11-23 DIAGNOSIS — I252 Old myocardial infarction: Secondary | ICD-10-CM | POA: Diagnosis not present

## 2022-11-23 NOTE — Progress Notes (Signed)
Daily Session Note  Patient Details  Name: Stacey Francis MRN: [id number] Date of Birth: August 16, 1955 Referring Provider:   Flowsheet Row Cardiac Rehab from 09/18/2022 in [facility] Cardiac and Pulmonary Rehab  Referring Provider callwood       Encounter Date: 11/23/2022  Check In:  Session Check In - 11/23/22 1528       Check-In   Supervising physician immediately available to respond to emergencies See telemetry face sheet for immediately available ER MD    Location [facility]-Cardiac & Pulmonary Rehab    Staff Present Susann Givens, RN BSN;Noah Tickle, BS, Exercise Physiologist;Susanne Bice, RN, BSN, CCRP    Virtual Visit No    Medication changes reported     No    Fall or balance concerns reported    No    Warm-up and Cool-down Performed on first and last piece of equipment    Resistance Training Performed Yes    VAD Patient? No    PAD/SET Patient? No      Pain Assessment   Currently in Pain? No/denies                Social History   Tobacco Use  Smoking Status Never  Smokeless Tobacco Never    Goals Met:  Independence with exercise equipment Exercise tolerated well No report of concerns or symptoms today Strength training completed today  Goals Unmet:  Not Applicable   Comments: Pt able to follow exercise prescription today without complaint.  Will continue to monitor for progression.    Dr. Bethann Punches is Medical Director for [facility] Cardiac Rehabilitation.  Dr. Vida Rigger is Medical Director for [facility] Pulmonary Rehabilitation.

## 2022-11-29 ENCOUNTER — Encounter: Payer: No Typology Code available for payment source | Admitting: *Deleted

## 2022-11-29 DIAGNOSIS — Z955 Presence of coronary angioplasty implant and graft: Secondary | ICD-10-CM

## 2022-11-29 DIAGNOSIS — R0602 Shortness of breath: Secondary | ICD-10-CM | POA: Diagnosis not present

## 2022-11-29 DIAGNOSIS — I2510 Atherosclerotic heart disease of native coronary artery without angina pectoris: Secondary | ICD-10-CM | POA: Diagnosis not present

## 2022-11-29 DIAGNOSIS — R5382 Chronic fatigue, unspecified: Secondary | ICD-10-CM | POA: Diagnosis not present

## 2022-11-29 DIAGNOSIS — F419 Anxiety disorder, unspecified: Secondary | ICD-10-CM | POA: Diagnosis not present

## 2022-11-29 DIAGNOSIS — E782 Mixed hyperlipidemia: Secondary | ICD-10-CM | POA: Diagnosis not present

## 2022-11-29 DIAGNOSIS — I213 ST elevation (STEMI) myocardial infarction of unspecified site: Secondary | ICD-10-CM

## 2022-11-29 DIAGNOSIS — I10 Essential (primary) hypertension: Secondary | ICD-10-CM | POA: Diagnosis not present

## 2022-11-29 DIAGNOSIS — Z5189 Encounter for other specified aftercare: Secondary | ICD-10-CM | POA: Diagnosis not present

## 2022-11-29 DIAGNOSIS — J069 Acute upper respiratory infection, unspecified: Secondary | ICD-10-CM | POA: Diagnosis not present

## 2022-11-29 DIAGNOSIS — I6789 Other cerebrovascular disease: Secondary | ICD-10-CM | POA: Diagnosis not present

## 2022-11-29 DIAGNOSIS — I2111 ST elevation (STEMI) myocardial infarction involving right coronary artery: Secondary | ICD-10-CM | POA: Diagnosis not present

## 2022-11-29 DIAGNOSIS — R5383 Other fatigue: Secondary | ICD-10-CM | POA: Diagnosis not present

## 2022-11-29 DIAGNOSIS — I252 Old myocardial infarction: Secondary | ICD-10-CM | POA: Diagnosis not present

## 2022-11-29 NOTE — Progress Notes (Signed)
Daily Session Note  Patient Details  Name: Stacey Francis MRN: [id number] Date of Birth: 09-Jul-1955 Referring Provider:   Flowsheet Row Cardiac Rehab from 09/18/2022 in [facility] Cardiac and Pulmonary Rehab  Referring Provider [place]wood       Encounter Date: 11/29/2022  Check In:  Session Check In - 11/29/22 1544       Check-In   Supervising physician immediately available to respond to emergencies See telemetry face sheet for immediately available ER MD    Location [facility]-Cardiac & Pulmonary Rehab    Staff Present Susann Givens, RN BSN;Laureen Manson Passey, BS, RRT, CPFT;Kelly Madilyn Fireman, BS, ACSM CEP, Exercise Physiologist    Virtual Visit No    Medication changes reported     No    Fall or balance concerns reported    No    Tobacco Cessation No Change    Warm-up and Cool-down Performed on first and last piece of equipment    Resistance Training Performed Yes    VAD Patient? No    PAD/SET Patient? No      Pain Assessment   Currently in Pain? No/denies                Social History   Tobacco Use  Smoking Status Never  Smokeless Tobacco Never    Goals Met:  Independence with exercise equipment Exercise tolerated well No report of concerns or symptoms today Strength training completed today  Goals Unmet:  Not Applicable  Comments: Pt able to follow exercise prescription today without complaint.  Will continue to monitor for progression.    Dr. Bethann Punches is Medical Director for [facility] Cardiac Rehabilitation.  Dr. Vida Rigger is Medical Director for [facility] Pulmonary Rehabilitation.

## 2022-12-01 ENCOUNTER — Emergency Department (HOSPITAL_BASED_OUTPATIENT_CLINIC_OR_DEPARTMENT_OTHER): Payer: No Typology Code available for payment source

## 2022-12-01 ENCOUNTER — Other Ambulatory Visit: Payer: Self-pay

## 2022-12-01 ENCOUNTER — Encounter (HOSPITAL_BASED_OUTPATIENT_CLINIC_OR_DEPARTMENT_OTHER): Payer: Self-pay

## 2022-12-01 ENCOUNTER — Emergency Department (HOSPITAL_BASED_OUTPATIENT_CLINIC_OR_DEPARTMENT_OTHER)
Admission: EM | Admit: 2022-12-01 | Discharge: 2022-12-01 | Disposition: A | Discharge status: Home / Self Care | Payer: No Typology Code available for payment source | Attending: Emergency Medicine | Admitting: Emergency Medicine

## 2022-12-01 ENCOUNTER — Emergency Department (HOSPITAL_BASED_OUTPATIENT_CLINIC_OR_DEPARTMENT_OTHER): Payer: No Typology Code available for payment source | Admitting: Radiology

## 2022-12-01 DIAGNOSIS — Z20822 Contact with and (suspected) exposure to covid-19: Secondary | ICD-10-CM | POA: Insufficient documentation

## 2022-12-01 DIAGNOSIS — R946 Abnormal results of thyroid function studies: Secondary | ICD-10-CM | POA: Diagnosis not present

## 2022-12-01 DIAGNOSIS — R109 Unspecified abdominal pain: Secondary | ICD-10-CM | POA: Diagnosis not present

## 2022-12-01 DIAGNOSIS — F419 Anxiety disorder, unspecified: Secondary | ICD-10-CM | POA: Diagnosis not present

## 2022-12-01 DIAGNOSIS — I10 Essential (primary) hypertension: Secondary | ICD-10-CM | POA: Insufficient documentation

## 2022-12-01 DIAGNOSIS — I2510 Atherosclerotic heart disease of native coronary artery without angina pectoris: Secondary | ICD-10-CM | POA: Diagnosis not present

## 2022-12-01 DIAGNOSIS — R42 Dizziness and giddiness: Secondary | ICD-10-CM | POA: Diagnosis not present

## 2022-12-01 DIAGNOSIS — K4090 Unilateral inguinal hernia, without obstruction or gangrene, not specified as recurrent: Secondary | ICD-10-CM | POA: Diagnosis not present

## 2022-12-01 DIAGNOSIS — R0789 Other chest pain: Secondary | ICD-10-CM | POA: Diagnosis not present

## 2022-12-01 DIAGNOSIS — R531 Weakness: Secondary | ICD-10-CM | POA: Diagnosis not present

## 2022-12-01 DIAGNOSIS — R079 Chest pain, unspecified: Secondary | ICD-10-CM | POA: Diagnosis not present

## 2022-12-01 DIAGNOSIS — M545 Low back pain, unspecified: Secondary | ICD-10-CM | POA: Insufficient documentation

## 2022-12-01 DIAGNOSIS — K8020 Calculus of gallbladder without cholecystitis without obstruction: Secondary | ICD-10-CM | POA: Diagnosis not present

## 2022-12-01 DIAGNOSIS — K5730 Diverticulosis of large intestine without perforation or abscess without bleeding: Secondary | ICD-10-CM | POA: Diagnosis not present

## 2022-12-01 DIAGNOSIS — R5383 Other fatigue: Secondary | ICD-10-CM | POA: Diagnosis not present

## 2022-12-01 DIAGNOSIS — E782 Mixed hyperlipidemia: Secondary | ICD-10-CM | POA: Diagnosis not present

## 2022-12-01 DIAGNOSIS — I2111 ST elevation (STEMI) myocardial infarction involving right coronary artery: Secondary | ICD-10-CM | POA: Diagnosis not present

## 2022-12-01 DIAGNOSIS — R072 Precordial pain: Secondary | ICD-10-CM | POA: Insufficient documentation

## 2022-12-01 DIAGNOSIS — E119 Type 2 diabetes mellitus without complications: Secondary | ICD-10-CM | POA: Diagnosis not present

## 2022-12-01 DIAGNOSIS — Z955 Presence of coronary angioplasty implant and graft: Secondary | ICD-10-CM | POA: Diagnosis not present

## 2022-12-01 DIAGNOSIS — R5382 Chronic fatigue, unspecified: Secondary | ICD-10-CM | POA: Diagnosis not present

## 2022-12-01 DIAGNOSIS — R0602 Shortness of breath: Secondary | ICD-10-CM | POA: Diagnosis not present

## 2022-12-01 LAB — CBC WITH DIFFERENTIAL/PLATELET
Abs Immature Granulocytes: 0.01 10*3/uL (ref 0.00–0.07)
Basophils Absolute: 0 10*3/uL (ref 0.0–0.1)
Basophils Relative: 1 %
Eosinophils Absolute: 0.1 10*3/uL (ref 0.0–0.5)
Eosinophils Relative: 1 %
HCT: 35.5 % — ABNORMAL LOW (ref 36.0–46.0)
Hemoglobin: 11.9 g/dL — ABNORMAL LOW (ref 12.0–15.0)
Immature Granulocytes: 0 %
Lymphocytes Relative: 30 %
Lymphs Abs: 1.9 10*3/uL (ref 0.7–4.0)
MCH: 29.3 pg (ref 26.0–34.0)
MCHC: 33.5 g/dL (ref 30.0–36.0)
MCV: 87.4 fL (ref 80.0–100.0)
Monocytes Absolute: 0.7 10*3/uL (ref 0.1–1.0)
Monocytes Relative: 11 %
Neutro Abs: 3.5 10*3/uL (ref 1.7–7.7)
Neutrophils Relative %: 57 %
Platelets: 231 10*3/uL (ref 150–400)
RBC: 4.06 MIL/uL (ref 3.87–5.11)
RDW: 12.3 % (ref 11.5–15.5)
WBC: 6.2 10*3/uL (ref 4.0–10.5)
nRBC: 0 % (ref 0.0–0.2)

## 2022-12-01 LAB — URINALYSIS, ROUTINE W REFLEX MICROSCOPIC
Bacteria, UA: NONE SEEN
Bilirubin Urine: NEGATIVE
Glucose, UA: NEGATIVE mg/dL
Hgb urine dipstick: NEGATIVE
Ketones, ur: NEGATIVE mg/dL
Nitrite: NEGATIVE
Protein, ur: NEGATIVE mg/dL
Specific Gravity, Urine: 1.01 (ref 1.005–1.030)
pH: 5 (ref 5.0–8.0)

## 2022-12-01 LAB — COMPREHENSIVE METABOLIC PANEL
ALT: 12 U/L (ref 0–44)
AST: 16 U/L (ref 15–41)
Albumin: 4.3 g/dL (ref 3.5–5.0)
Alkaline Phosphatase: 50 U/L (ref 38–126)
Anion gap: 10 (ref 5–15)
BUN: 19 mg/dL (ref 8–23)
CO2: 24 mmol/L (ref 22–32)
Calcium: 9.3 mg/dL (ref 8.9–10.3)
Chloride: 102 mmol/L (ref 98–111)
Creatinine, Ser: 0.9 mg/dL (ref 0.44–1.00)
GFR, Estimated: >60 mL/min (ref >60)
Glucose, Bld: 92 mg/dL (ref 70–99)
Potassium: 4 mmol/L (ref 3.5–5.1)
Sodium: 136 mmol/L (ref 135–145)
Total Bilirubin: 0.8 mg/dL (ref 0.3–1.2)
Total Protein: 6.8 g/dL (ref 6.5–8.1)

## 2022-12-01 LAB — LIPASE, BLOOD: Lipase: 35 U/L (ref 11–51)

## 2022-12-01 LAB — SARS CORONAVIRUS 2 BY RT PCR: SARS Coronavirus 2 by RT PCR: NEGATIVE

## 2022-12-01 LAB — TSH: TSH: 5.563 u[IU]/mL — ABNORMAL HIGH (ref 0.350–4.500)

## 2022-12-01 LAB — TROPONIN I (HIGH SENSITIVITY): Troponin I (High Sensitivity): 3 ng/L (ref <18)

## 2022-12-01 MED ORDER — SODIUM CHLORIDE 0.9 % IV BOLUS
500.0000 mL | Freq: Once | INTRAVENOUS | Status: AC
  Administered 2022-12-01: 500 mL via INTRAVENOUS

## 2022-12-01 NOTE — ED Triage Notes (Signed)
POV from home, A&O x 4, GCS 15, amb to room  C/o intermittent lower back, fatigue, intermittent dizziness, occasional chest pressure midsternally. Pt sts that this began around Monday, stents placed in March.

## 2022-12-01 NOTE — ED Provider Notes (Signed)
Emergency Department Provider Note   I have reviewed the triage vital signs and the nursing notes.   HISTORY  Chief Complaint Back Pain and Chest Pain   HPI Stacey Francis is a 67 y.o. female past history of CAD and hypertension presents emergency department for evaluation of intermittent low back pain with fatigue and lightheadedness.  She has had some intermittent chest pressure today as well. Has been following with her primary care and cardiologist recently.  Unsure if her chest discomfort feels similar to her prior ACS pain.  Her low back pain is bandlike across her lower back.  It does not radiate to the legs.  No numbness or weakness.  No injuries.  No bowel or bladder incontinence or groin numbness.  Her generalized weakness has been ongoing for some time and is not aware of any clear diagnosis regarding this.    Past Medical History:  Diagnosis Date   Hypertension    Sinus infection     Review of Systems  Constitutional: No fever/chills. Positive weakness.  Cardiovascular: Intermittent chest pain. Respiratory: Denies shortness of breath. Gastrointestinal: No abdominal pain.  Musculoskeletal: Positive for back pain. Skin: Negative for rash. Neurological: Negative for headaches, focal weakness or numbness.  ____________________________________________   PHYSICAL EXAM:  VITAL SIGNS: ED Triage Vitals  Enc Vitals Group     BP 12/01/22 1945 131/70     Pulse Rate 12/01/22 1945 83     Resp 12/01/22 1945 17     Temp 12/01/22 1945 98.1 F (36.7 C)     Temp Source 12/01/22 1945 Oral     SpO2 12/01/22 1945 97 %     Weight 12/01/22 1940 116 lb (52.6 kg)     Height 12/01/22 1940 5\' 3"  (1.6 m)    Constitutional: Alert and oriented. Well appearing and in no acute distress. Eyes: Conjunctivae are normal.  Head: Atraumatic. Nose: No congestion/rhinnorhea. Mouth/Throat: Mucous membranes are moist.  Neck: No stridor.  Cardiovascular: Normal rate, regular rhythm.  Good peripheral circulation. Grossly normal heart sounds.   Respiratory: Normal respiratory effort.  No retractions. Lungs CTAB. Gastrointestinal: Soft and nontender. No distention.  Musculoskeletal: No lower extremity tenderness nor edema. No gross deformities of extremities. No midline thoracic or lumbar spine tenderness.  Neurologic:  Normal speech and language. No gross focal neurologic deficits are appreciated. 5/5 strength in the bilateral upper and lower extremities.  Skin:  Skin is warm, dry and intact. No rash noted.   ____________________________________________   LABS (all labs ordered are listed, but only abnormal results are displayed)  Labs Reviewed  CBC WITH DIFFERENTIAL/PLATELET - Abnormal; Notable for the following components:      Result Value   Hemoglobin 11.9 (*)    HCT 35.5 (*)    All other components within normal limits  URINALYSIS, ROUTINE W REFLEX MICROSCOPIC - Abnormal; Notable for the following components:   Leukocytes,Ua SMALL (*)    All other components within normal limits  TSH - Abnormal; Notable for the following components:   TSH 5.563 (*)    All other components within normal limits  SARS CORONAVIRUS 2 BY RT PCR  COMPREHENSIVE METABOLIC PANEL  LIPASE, BLOOD  TROPONIN I (HIGH SENSITIVITY)   ____________________________________________  EKG  NSR. Narrow QRS. No STEMI.   ____________________________________________  RADIOLOGY  DG Chest 2 View  Result Date: 12/01/2022 CLINICAL DATA:  Chest pain EXAM: CHEST - 2 VIEW COMPARISON:  09/04/2022 FINDINGS: The heart size and mediastinal contours are within normal limits. Both  lungs are clear. The visualized skeletal structures are unremarkable. IMPRESSION: No active cardiopulmonary disease. Electronically Signed   By: Alcide Clever M.D.   On: 12/01/2022 20:37    ____________________________________________   PROCEDURES  Procedure(s) performed:   Procedures  None   ____________________________________________   INITIAL IMPRESSION / ASSESSMENT AND PLAN / ED COURSE  Pertinent labs & imaging results that were available during my care of the patient were reviewed by me and considered in my medical decision making (see chart for details).   This patient is Presenting for Evaluation of CP, which does require a range of treatment options, and is a complaint that involves a high risk of morbidity and mortality.  The Differential Diagnoses includes but is not exclusive to acute coronary syndrome, aortic dissection, pulmonary embolism, cardiac tamponade, community-acquired pneumonia, pericarditis, musculoskeletal chest wall pain, etc.   Critical Interventions-    Medications  sodium chloride 0.9 % bolus 500 mL (0 mLs Intravenous Stopped 12/01/22 2255)    Reassessment after intervention:     I did obtain Additional Historical Information from daughter at bedside.  I decided to review pertinent External Data, and in summary patient is followed by Cardiology at [facility].   Clinical Laboratory Tests Ordered, included TSH mildly elevated. Negative COVID. Troponin negaitv.e No AKI.   Radiologic Tests Ordered, included CXR and CT renal. I independently interpreted the images and agree with radiology interpretation.   Cardiac Monitor Tracing which shows NSR.    Social Determinants of Health Risk patient is a non-smoker.   Medical Decision Making: Summary:  Patient presents emergency department with atypical chest pain and lower back discomfort.  Considered vascular etiology for pain but pulses are intact and back pain seems more like a diffuse, bandlike/throbbing pain.  No neurodeficits to prompt emergent imaging.  Plan for CT renal along with cardiac workup.   Reevaluation with update and discussion with patient. Symptoms improved. Plan for discharge with continued PCP and Cardiology follow up. Discussed slightly high TSH. Patient to discuss with PCP for  further outpatient follow up.   Patient's presentation is most consistent with acute, uncomplicated illness.   Disposition: discharge  ____________________________________________  FINAL CLINICAL IMPRESSION(S) / ED DIAGNOSES  Final diagnoses:  Precordial chest pain  Acute bilateral low back pain without sciatica    Note:  This document was prepared using Dragon voice recognition software and may include unintentional dictation errors.  Alona Bene, MD, [facility]    Farris Blash, Arlyss Repress, MD 12/04/22 [phone]

## 2022-12-01 NOTE — ED Triage Notes (Signed)
Seen cardiology today for symptoms, with neg EKG and no concerns from cardiologist.

## 2022-12-01 NOTE — Discharge Instructions (Signed)
You are seen in the emergency room today with lower back discomfort and chest discomfort.  Your heart labs are reassuring and CT scan did not show any acute abnormalities.  We did check a thyroid lab (TSH) was slightly high meaning that your thyroid could be low.  Your primary care doctor may decide to send further blood tests but you will need to discuss this with them at your next appointment.  Please call to schedule a follow-up appointment in the next 1 to 2 weeks.  Continue your follow-up with your cardiologist and return with any new or suddenly worsening symptoms.

## 2022-12-04 ENCOUNTER — Encounter: Payer: No Typology Code available for payment source | Attending: Internal Medicine | Admitting: *Deleted

## 2022-12-04 VITALS — Ht 63.5 in | Wt 118.3 lb

## 2022-12-04 DIAGNOSIS — Z955 Presence of coronary angioplasty implant and graft: Secondary | ICD-10-CM | POA: Diagnosis not present

## 2022-12-04 DIAGNOSIS — I213 ST elevation (STEMI) myocardial infarction of unspecified site: Secondary | ICD-10-CM | POA: Diagnosis not present

## 2022-12-04 NOTE — Patient Instructions (Signed)
Discharge Patient Instructions  Patient Details  Name: Stacey Francis MRN: [id number] Date of Birth: 1956/03/16 Referring Provider:  Judeen Hammans, MD   Number of Visits: 58  Reason for Discharge:  Patient reached a stable level of exercise. Patient independent in their exercise. Patient has met program and personal goals.   Diagnosis:  ST elevation myocardial infarction (STEMI), unspecified artery (HCC)  Status post coronary artery stent placement  Initial Exercise Prescription:  Initial Exercise Prescription - 09/18/22 1600       Date of Initial Exercise RX and Referring Provider   Date 09/18/22    Referring Provider [place]      Oxygen   Maintain Oxygen Saturation 88% or higher      Treadmill   MPH 2    Grade 1    Minutes 15    METs 2.81      Recumbant Bike   Level 2    RPM 50    Watts 20    Minutes 15    METs 3.18      NuStep   Level 2    SPM 80    Minutes 15    METs 3.18      REL-XR   Level 2    Speed 50    Minutes 15    METs 3.18      T5 Nustep   Level 1    SPM 80    Minutes 15    METs 3.18      Prescription Details   Frequency (times per week) 3    Duration Progress to 30 minutes of continuous aerobic without signs/symptoms of physical distress      Intensity   THRR 40-80% of Max Heartrate 111-139    Ratings of Perceived Exertion 11-13    Perceived Dyspnea 0-4      Progression   Progression Continue to progress workloads to maintain intensity without signs/symptoms of physical distress.      Resistance Training   Training Prescription Yes    Weight 3    Reps 10-15             Discharge Exercise Prescription (Final Exercise Prescription Changes):  Exercise Prescription Changes - 11/20/22 1500       Response to Exercise   Blood Pressure (Admit) 124/62    Blood Pressure (Exit) 124/62    Heart Rate (Admit) 72 bpm    Heart Rate (Exercise) 112 bpm    Heart Rate (Exit) 89 bpm    Rating of Perceived Exertion  (Exercise) 13    Symptoms none    Duration Continue with 30 min of aerobic exercise without signs/symptoms of physical distress.    Intensity THRR unchanged      Progression   Progression Continue to progress workloads to maintain intensity without signs/symptoms of physical distress.    Average METs 3.3      Resistance Training   Training Prescription Yes    Weight 3 lb    Reps 10-15      Interval Training   Interval Training No      Treadmill   MPH 2.3    Grade 1    Minutes 15    METs 3.08      Recumbant Bike   Level 3.2    Watts 20    Minutes 15    METs 3.4      NuStep   Level 5    Minutes 15    METs 3.9  Home Exercise Plan   Plans to continue exercise at Home (comment)   Walking, weights, yoga   Frequency Add 2 additional days to program exercise sessions.    Initial Home Exercises Provided 10/11/22      Oxygen   Maintain Oxygen Saturation 88% or higher             Functional Capacity:  6 Minute Walk     Row Name 09/18/22 1651 12/04/22 1554       6 Minute Walk   Phase Initial Discharge    Distance 1135 feet 1385 feet    Distance % Change -- 22 %    Distance Feet Change -- 250 ft    Walk Time 6 minutes 6 minutes    # of Rest Breaks 0 0    MPH 2.15 2.62    METS 3.18 3.44    RPE 9 10    Perceived Dyspnea  0 0    VO2 Peak 11.13 12.04    Symptoms No No    Resting HR 83 bpm 78 bpm    Resting BP 132/82 112/62    Resting Oxygen Saturation  99 % 99 %    Exercise Oxygen Saturation  during 6 min walk 97 % 99 %    Max Ex. HR 111 bpm 99 bpm    Max Ex. BP 130/82 124/82    2 Minute Post BP 118/62 118/70              Nutrition & Weight - Outcomes:  Pre Biometrics - 12/04/22 1556       Pre Biometrics   Height 5' 3.5" (1.613 m)    Weight 118 lb 4.8 oz (53.7 kg)    Waist Circumference 28 inches    Hip Circumference 36.5 inches    Waist to Hip Ratio 0.77 %    BMI (Calculated) 20.62    Single Leg Stand 30 seconds               Nutrition:  Nutrition Therapy & Goals - 09/18/22 1657       Intervention Plan   Intervention Prescribe, educate and counsel regarding individualized specific dietary modifications aiming towards targeted core components such as weight, hypertension, lipid management, diabetes, heart failure and other comorbidities.    Expected Outcomes Short Term Goal: Understand basic principles of dietary content, such as calories, fat, sodium, cholesterol and nutrients.;Short Term Goal: A plan has been developed with personal nutrition goals set during dietitian appointment.;Long Term Goal: Adherence to prescribed nutrition plan.               Goals reviewed with patient; copy given to patient.

## 2022-12-04 NOTE — Progress Notes (Signed)
Daily Session Note  Patient Details  Name: Stacey Francis MRN: [id number] Date of Birth: 21-Mar-1956 Referring Provider:   Flowsheet Row Cardiac Rehab from 09/18/2022 in [facility] Cardiac and Pulmonary Rehab  Referring Provider [place]       Encounter Date: 12/04/2022  Check In:  Session Check In - 12/04/22 1548       Check-In   Supervising physician immediately available to respond to emergencies See telemetry face sheet for immediately available ER MD    Location [facility]-Cardiac & Pulmonary Rehab    Staff Present Susann Givens, RN Mabeline Caras, BS, ACSM CEP, Exercise Physiologist;Joseph Reino [place], [place]    Virtual Visit No    Medication changes reported     No    Fall or balance concerns reported    No    Warm-up and Cool-down Performed on first and last piece of equipment    Resistance Training Performed Yes    VAD Patient? No    PAD/SET Patient? No      Pain Assessment   Currently in Pain? No/denies                Social History   Tobacco Use  Smoking Status Never  Smokeless Tobacco Never    Goals Met:  Independence with exercise equipment Exercise tolerated well No report of concerns or symptoms today Strength training completed today  Goals Unmet:  Not Applicable  Comments: Pt able to follow exercise prescription today without complaint.  Will continue to monitor for progression.   6 Minute Walk     Row Name 09/18/22 1651 12/04/22 1554       6 Minute Walk   Phase Initial Discharge    Distance 1135 feet 1385 feet    Distance % Change -- 22 %    Distance Feet Change -- 250 ft    Walk Time 6 minutes 6 minutes    # of Rest Breaks 0 0    MPH 2.15 2.62    METS 3.18 3.44    RPE 9 10    Perceived Dyspnea  0 0    VO2 Peak 11.13 12.04    Symptoms No No    Resting HR 83 bpm 78 bpm    Resting BP 132/82 112/62    Resting Oxygen Saturation  99 % 99 %    Exercise Oxygen Saturation  during 6 min walk 97 % 99 %    Max Ex. HR 111 bpm 99 bpm    Max  Ex. BP 130/82 124/82    2 Minute Post BP 118/62 118/70                Dr. Bethann Punches is Medical Director for [facility] Cardiac Rehabilitation.  Dr. Vida Rigger is Medical Director for [facility] Pulmonary Rehabilitation.

## 2022-12-06 ENCOUNTER — Encounter: Payer: No Typology Code available for payment source | Admitting: *Deleted

## 2022-12-06 DIAGNOSIS — I213 ST elevation (STEMI) myocardial infarction of unspecified site: Secondary | ICD-10-CM | POA: Diagnosis not present

## 2022-12-06 DIAGNOSIS — Z955 Presence of coronary angioplasty implant and graft: Secondary | ICD-10-CM

## 2022-12-06 NOTE — Progress Notes (Signed)
Daily Session Note  Patient Details  Name: Stacey Francis MRN: [id number] Date of Birth: [birth year] Referring Provider:   Flowsheet Row Cardiac Rehab from 09/18/2022 in [facility] Cardiac and Pulmonary Rehab  Referring Provider [place]       Encounter Date: 12/06/2022  Check In:  Session Check In - 12/06/22 1536       Check-In   Supervising physician immediately available to respond to emergencies See telemetry face sheet for immediately available ER MD    Location [facility]-Cardiac & Pulmonary Rehab    Staff Present Hulen Luster, BS, RRT, CPFT;Ayson Cherubini Jewel Baize, RN Atilano Median, RN, ADN    Virtual Visit No    Medication changes reported     No    Fall or balance concerns reported    No    Warm-up and Cool-down Performed on first and last piece of equipment    Resistance Training Performed Yes    VAD Patient? No    PAD/SET Patient? No      Pain Assessment   Currently in Pain? No/denies                Social History   Tobacco Use  Smoking Status Never  Smokeless Tobacco Never    Goals Met:  Independence with exercise equipment Exercise tolerated well No report of concerns or symptoms today Strength training completed today  Goals Unmet:  Not Applicable  Comments: Pt able to follow exercise prescription today without complaint.  Will continue to monitor for progression.    Dr. Bethann Punches is Medical Director for [facility] Cardiac Rehabilitation.  Dr. Vida Rigger is Medical Director for [facility] Pulmonary Rehabilitation.

## 2022-12-10 DIAGNOSIS — J069 Acute upper respiratory infection, unspecified: Secondary | ICD-10-CM | POA: Diagnosis not present

## 2022-12-12 ENCOUNTER — Encounter: Payer: Self-pay | Admitting: *Deleted

## 2022-12-12 DIAGNOSIS — I213 ST elevation (STEMI) myocardial infarction of unspecified site: Secondary | ICD-10-CM

## 2022-12-12 DIAGNOSIS — Z955 Presence of coronary angioplasty implant and graft: Secondary | ICD-10-CM

## 2022-12-12 NOTE — Progress Notes (Signed)
Cardiac Individual Treatment Plan  Patient Details  Name: Stacey Francis MRN: [id number] Date of Birth: 31-Jan-1956 Referring Provider:   Flowsheet Row Cardiac Rehab from 09/18/2022 in [facility] Cardiac and Pulmonary Rehab  Referring Provider callwood       Initial Encounter Date:  Flowsheet Row Cardiac Rehab from 09/18/2022 in [facility] Cardiac and Pulmonary Rehab  Date 09/18/22       Visit Diagnosis: ST elevation myocardial infarction (STEMI), unspecified artery [facility])  Status post coronary artery stent placement  Patient's Home Medications on Admission:  Current Outpatient Medications:    aspirin 81 MG chewable tablet, Chew 1 tablet (81 mg total) by mouth daily. (Patient not taking: Reported on 09/11/2022), Disp: 90 tablet, Rfl: 3   atorvastatin (LIPITOR) 80 MG tablet, Take 1 tablet (80 mg total) by mouth daily., Disp: 30 tablet, Rfl: 11   cetirizine (ZYRTEC) 10 MG tablet, Take by mouth., Disp: , Rfl:    clopidogrel (PLAVIX) 75 MG tablet, Take by mouth., Disp: , Rfl:    levothyroxine (SYNTHROID) 25 MCG tablet, Take 25 mcg by mouth daily before breakfast., Disp: , Rfl:    levothyroxine (SYNTHROID) 25 MCG tablet, Take by mouth. (Patient not taking: Reported on 09/11/2022), Disp: , Rfl:    lisinopril (ZESTRIL) 2.5 MG tablet, Take 1 tablet (2.5 mg total) by mouth daily., Disp: 30 tablet, Rfl: 2   metoprolol succinate (TOPROL-XL) 25 MG 24 hr tablet, Take by mouth., Disp: , Rfl:    metoprolol tartrate (LOPRESSOR) 25 MG tablet, Take 0.5 tablets (12.5 mg total) by mouth 2 (two) times daily. (Patient not taking: Reported on 09/11/2022), Disp: 30 tablet, Rfl: 5   metoprolol tartrate (LOPRESSOR) 25 MG tablet, Take by mouth. (Patient not taking: Reported on 09/11/2022), Disp: , Rfl:    naproxen (NAPROSYN) 500 MG tablet, Take 500 mg by mouth 2 (two) times daily., Disp: , Rfl:    nitroGLYCERIN (NITROSTAT) 0.4 MG SL tablet, Place 1 tablet (0.4 mg total) under the tongue every 5 (five) minutes as needed for chest  pain., Disp: 30 tablet, Rfl: 0   ticagrelor (BRILINTA) 90 MG TABS tablet, Take 1 tablet (90 mg total) by mouth 2 (two) times daily. (Patient not taking: Reported on 09/11/2022), Disp: 180 tablet, Rfl: 3  Past Medical History: Past Medical History:  Diagnosis Date   Hypertension    Sinus infection     Tobacco Use: Social History   Tobacco Use  Smoking Status Never  Smokeless Tobacco Never    Labs: Review Flowsheet       Latest Ref Rng & Units 08/13/2022 08/14/2022  Labs for ITP Cardiac and Pulmonary Rehab  Cholestrol 0 - 200 mg/dL 409  -  LDL (calc) 0 - 99 mg/dL 811  -  HDL-C >91 mg/dL 84  -  Trlycerides <478 mg/dL 70  -  Hemoglobin G9F 4.8 - 5.6 % - 5.7      Exercise Target Goals: Exercise Program Goal: Individual exercise prescription set using results from initial 6 min walk test and THRR while considering  patient's activity barriers and safety.   Exercise Prescription Goal: Initial exercise prescription builds to 30-45 minutes a day of aerobic activity, 2-3 days per week.  Home exercise guidelines will be given to patient during program as part of exercise prescription that the participant will acknowledge.   Education: Aerobic Exercise: - Group verbal and visual presentation on the components of exercise prescription. Introduces F.I.T.T principle from ACSM for exercise prescriptions.  Reviews F.I.T.T. principles of aerobic exercise including  progression. Written material given at graduation. Flowsheet Row Cardiac Rehab from 12/06/2022 in [facility] Cardiac and Pulmonary Rehab  Education need identified 09/18/22  Date 11/08/22  Educator [facility]  Instruction Review Code 1- [employer] Understanding       Education: Resistance Exercise: - Group verbal and visual presentation on the components of exercise prescription. Introduces F.I.T.T principle from ACSM for exercise prescriptions  Reviews F.I.T.T. principles of resistance exercise including progression. Written material given  at graduation.    Education: Exercise & Equipment Safety: - Individual verbal instruction and demonstration of equipment use and safety with use of the equipment. Flowsheet Row Cardiac Rehab from 12/06/2022 in [facility] Cardiac and Pulmonary Rehab  Date 09/18/22  Educator [facility]  Instruction Review Code 1- Verbalizes Understanding       Education: Exercise Physiology & General Exercise Guidelines: - Group verbal and written instruction with models to review the exercise physiology of the cardiovascular system and associated critical values. Provides general exercise guidelines with specific guidelines to those with heart or lung disease.  Flowsheet Row Cardiac Rehab from 12/06/2022 in [facility] Cardiac and Pulmonary Rehab  Education need identified 09/18/22  Date 11/01/22  Educator [facility]  Instruction Review Code 1- [employer] Understanding       Education: Flexibility, Balance, Mind/Body Relaxation: - Group verbal and visual presentation with interactive activity on the components of exercise prescription. Introduces F.I.T.T principle from ACSM for exercise prescriptions. Reviews F.I.T.T. principles of flexibility and balance exercise training including progression. Also discusses the mind body connection.  Reviews various relaxation techniques to help reduce and manage stress (i.e. Deep breathing, progressive muscle relaxation, and visualization). Balance handout provided to take home. Written material given at graduation. Flowsheet Row Cardiac Rehab from 12/06/2022 in [facility] Cardiac and Pulmonary Rehab  Date 11/22/22  Educator NT  Instruction Review Code 1- Verbalizes Understanding       Activity Barriers & Risk Stratification:  Activity Barriers & Cardiac Risk Stratification - 09/18/22 1652       Activity Barriers & Cardiac Risk Stratification   Activity Barriers None    Cardiac Risk Stratification High             6 Minute Walk:  6 Minute Walk     Row Name 09/18/22 1651 12/04/22 1554        6 Minute Walk   Phase Initial Discharge    Distance 1135 feet 1385 feet    Distance % Change -- 22 %    Distance Feet Change -- 250 ft    Walk Time 6 minutes 6 minutes    # of Rest Breaks 0 0    MPH 2.15 2.62    METS 3.18 3.44    RPE 9 10    Perceived Dyspnea  0 0    VO2 Peak 11.13 12.04    Symptoms No No    Resting HR 83 bpm 78 bpm    Resting BP 132/82 112/62    Resting Oxygen Saturation  99 % 99 %    Exercise Oxygen Saturation  during 6 min walk 97 % 99 %    Max Ex. HR 111 bpm 99 bpm    Max Ex. BP 130/82 124/82    2 Minute Post BP 118/62 118/70             Oxygen Initial Assessment:   Oxygen Re-Evaluation:   Oxygen Discharge (Final Oxygen Re-Evaluation):   Initial Exercise Prescription:  Initial Exercise Prescription - 09/18/22 1600       Date  of Initial Exercise RX and Referring Provider   Date 09/18/22    Referring Provider callwood      Oxygen   Maintain Oxygen Saturation 88% or higher      Treadmill   MPH 2    Grade 1    Minutes 15    METs 2.81      Recumbant Bike   Level 2    RPM 50    Watts 20    Minutes 15    METs 3.18      NuStep   Level 2    SPM 80    Minutes 15    METs 3.18      REL-XR   Level 2    Speed 50    Minutes 15    METs 3.18      T5 Nustep   Level 1    SPM 80    Minutes 15    METs 3.18      Prescription Details   Frequency (times per week) 3    Duration Progress to 30 minutes of continuous aerobic without signs/symptoms of physical distress      Intensity   THRR 40-80% of Max Heartrate 111-139    Ratings of Perceived Exertion 11-13    Perceived Dyspnea 0-4      Progression   Progression Continue to progress workloads to maintain intensity without signs/symptoms of physical distress.      Resistance Training   Training Prescription Yes    Weight 3    Reps 10-15             Perform Capillary Blood Glucose checks as needed.  Exercise Prescription Changes:   Exercise Prescription Changes      Row Name 09/18/22 1600 10/09/22 1500 10/11/22 1600 10/24/22 0700 11/07/22 0800     Response to Exercise   Blood Pressure (Admit) 132/82 126/68 -- 118/60 112/64   Blood Pressure (Exercise) 130/82 146/82 -- 148/60 --   Blood Pressure (Exit) 118/62 112/62 -- 112/64 102/60   Heart Rate (Admit) 83 bpm 63 bpm -- 85 bpm 84 bpm   Heart Rate (Exercise) 111 bpm 111 bpm -- 122 bpm 119 bpm   Heart Rate (Exit) 80 bpm 82 bpm -- 94 bpm 97 bpm   Oxygen Saturation (Admit) 99 % -- -- -- --   Oxygen Saturation (Exercise) 97 % -- -- -- --   Oxygen Saturation (Exit) 100 % -- -- -- --   Rating of Perceived Exertion (Exercise) 9 13 -- 13 13   Perceived Dyspnea (Exercise) 0 -- -- -- --   Symptoms none none -- none none   Comments 6 MWT results 2nd full day of exercise -- -- --   Duration -- Progress to 30 minutes of  aerobic without signs/symptoms of physical distress -- Continue with 30 min of aerobic exercise without signs/symptoms of physical distress. Continue with 30 min of aerobic exercise without signs/symptoms of physical distress.   Intensity -- THRR unchanged -- THRR unchanged THRR unchanged     Progression   Progression -- Continue to progress workloads to maintain intensity without signs/symptoms of physical distress. -- Continue to progress workloads to maintain intensity without signs/symptoms of physical distress. Continue to progress workloads to maintain intensity without signs/symptoms of physical distress.   Average METs -- 2.57 -- 2.78 3.03     Resistance Training   Training Prescription -- Yes -- Yes Yes   Weight -- 3 lb -- 3 lb 3 lb  Reps -- 10-15 -- 10-15 10-15     Interval Training   Interval Training -- No -- No No     Treadmill   MPH -- -- -- 1.5 --   Grade -- -- -- 0.5 --   Minutes -- -- -- 15 --   METs -- -- -- 2.25 --     Recumbant Bike   Level -- 1.2 -- 2 2   Watts -- 20 -- 20 --   Minutes -- 15 -- 15 15   METs -- 2.6 -- 2.4 3.4     NuStep   Level -- 4 -- 4 5    Minutes -- 15 -- 15 15   METs -- 2.7 -- 3 3.7     Biostep-RELP   Level -- -- -- 2 2   Minutes -- -- -- 15 15   METs -- -- -- 3 2     Home Exercise Plan   Plans to continue exercise at -- -- Home (comment)  Walking, weights, yoga Home (comment)  Walking, weights, yoga Home (comment)  Walking, weights, yoga   Frequency -- -- Add 2 additional days to program exercise sessions. Add 2 additional days to program exercise sessions. Add 2 additional days to program exercise sessions.   Initial Home Exercises Provided -- -- 10/11/22 10/11/22 10/11/22     Oxygen   Maintain Oxygen Saturation -- 88% or higher 88% or higher 88% or higher 88% or higher    Row Name 11/20/22 1500 12/08/22 0700           Response to Exercise   Blood Pressure (Admit) 124/62 118/72      Blood Pressure (Exit) 124/62 118/64      Heart Rate (Admit) 72 bpm 86 bpm      Heart Rate (Exercise) 112 bpm 115 bpm      Heart Rate (Exit) 89 bpm 80 bpm      Rating of Perceived Exertion (Exercise) 13 13      Symptoms none none      Duration Continue with 30 min of aerobic exercise without signs/symptoms of physical distress. Continue with 30 min of aerobic exercise without signs/symptoms of physical distress.      Intensity THRR unchanged THRR unchanged        Progression   Progression Continue to progress workloads to maintain intensity without signs/symptoms of physical distress. Continue to progress workloads to maintain intensity without signs/symptoms of physical distress.      Average METs 3.3 3.09        Resistance Training   Training Prescription Yes Yes      Weight 3 lb 3 lb      Reps 10-15 10-15        Interval Training   Interval Training No No        Treadmill   MPH 2.3 2.3      Grade 1 2      Minutes 15 15      METs 3.08 3.39        Recumbant Bike   Level 3.2 3.6      Watts 20 20      Minutes 15 15      METs 3.4 3.16        NuStep   Level 5 5      Minutes 15 15      METs 3.9 3.4         REL-XR   Level -- 2  Minutes -- 15        Home Exercise Plan   Plans to continue exercise at Home (comment)  Walking, weights, yoga Home (comment)  Walking, weights, yoga      Frequency Add 2 additional days to program exercise sessions. Add 2 additional days to program exercise sessions.      Initial Home Exercises Provided 10/11/22 10/11/22        Oxygen   Maintain Oxygen Saturation 88% or higher 88% or higher               Exercise Comments:   Exercise Comments     Row Name 09/25/22 1600           Exercise Comments First full day of exercise!  Patient was oriented to gym and equipment including functions, settings, policies, and procedures.  Patient's individual exercise prescription and treatment plan were reviewed.  All starting workloads were established based on the results of the 6 minute walk test done at initial orientation visit.  The plan for exercise progression was also introduced and progression will be customized based on patient's performance and goals.                Exercise Goals and Review:   Exercise Goals     Row Name 09/18/22 1655             Exercise Goals   Increase Physical Activity Yes       Intervention Provide advice, education, support and counseling about physical activity/exercise needs.;Develop an individualized exercise prescription for aerobic and resistive training based on initial evaluation findings, risk stratification, comorbidities and participant's personal goals.       Expected Outcomes Short Term: Attend rehab on a regular basis to increase amount of physical activity.;Long Term: Add in home exercise to make exercise part of routine and to increase amount of physical activity.;Long Term: Exercising regularly at least 3-5 days a week.       Increase Strength and Stamina Yes       Intervention Provide advice, education, support and counseling about physical activity/exercise needs.;Develop an individualized exercise  prescription for aerobic and resistive training based on initial evaluation findings, risk stratification, comorbidities and participant's personal goals.       Expected Outcomes Short Term: Increase workloads from initial exercise prescription for resistance, speed, and METs.;Short Term: Perform resistance training exercises routinely during rehab and add in resistance training at home;Long Term: Improve cardiorespiratory fitness, muscular endurance and strength as measured by increased METs and functional capacity ( )       Able to understand and use rate of perceived exertion (RPE) scale Yes       Intervention Provide education and explanation on how to use RPE scale       Expected Outcomes Short Term: Able to use RPE daily in rehab to express subjective intensity level;Long Term:  Able to use RPE to guide intensity level when exercising independently       Able to understand and use Dyspnea scale Yes       Intervention Provide education and explanation on how to use Dyspnea scale       Expected Outcomes Short Term: Able to use Dyspnea scale daily in rehab to express subjective sense of shortness of breath during exertion;Long Term: Able to use Dyspnea scale to guide intensity level when exercising independently       Knowledge and understanding of Target Heart Rate Range (THRR) Yes       Intervention  Provide education and explanation of THRR including how the numbers were predicted and where they are located for reference       Expected Outcomes Short Term: Able to state/look up THRR;Long Term: Able to use THRR to govern intensity when exercising independently;Short Term: Able to use daily as guideline for intensity in rehab       Able to check pulse independently Yes       Intervention Provide education and demonstration on how to check pulse in carotid and radial arteries.;Review the importance of being able to check your own pulse for safety during independent exercise       Expected Outcomes  Short Term: Able to explain why pulse checking is important during independent exercise       Understanding of Exercise Prescription Yes       Intervention Provide education, explanation, and written materials on patient's individual exercise prescription       Expected Outcomes Short Term: Able to explain program exercise prescription;Long Term: Able to explain home exercise prescription to exercise independently                Exercise Goals Re-Evaluation :  Exercise Goals Re-Evaluation     Row Name 09/25/22 1601 10/09/22 1545 10/09/22 1555 10/11/22 1609 10/24/22 0738     Exercise Goal Re-Evaluation   Exercise Goals Review Increase Physical Activity;Knowledge and understanding of Target Heart Rate Range (THRR);Able to understand and use rate of perceived exertion (RPE) scale;Understanding of Exercise Prescription;Increase Strength and Stamina;Able to check pulse independently Increase Physical Activity;Increase Strength and Stamina;Understanding of Exercise Prescription Increase Physical Activity;Increase Strength and Stamina;Understanding of Exercise Prescription Able to understand and use Dyspnea scale;Understanding of Exercise Prescription;Knowledge and understanding of Target Heart Rate Range (THRR);Able to check pulse independently;Able to understand and use rate of perceived exertion (RPE) scale;Increase Strength and Stamina;Increase Physical Activity Increase Physical Activity;Increase Strength and Stamina;Understanding of Exercise Prescription   Comments Reviewed RPE scale, THR and program prescription with pt today.  Pt voiced understanding and was given a copy of goals to take home. Maziyah is off to a good start in rehab.  She is doing some yoga at home on her off days.  She also does a little resistance training and walking as well. We will review home exercise guidelines with her once she has been coming consistently for a bit.  She was encouraged to maintain what she was currently  doing. Junnie has done well with her first couple of sessions of rehab. She has started off with her initial exercise prescription with appropriate RPEs. She did hit her THR the 2 sessions she was here and we hope to see that kept up over her time here at rehab. Will continue to monitor as she progresses. Reviewed home exercise with pt today.  Pt plans to walk at home, use weights, and do yoga for exercise.  Reviewed THR, pulse, RPE, sign and symptoms, pulse oximetery and when to call 911 or MD.  Also discussed weather considerations and indoor options.  Pt voiced understanding. Ahjah is doing well in rehab. She recently increased her overall average MET level to 2.78 METs. She also walked the treadmill once since the last review at a speed of 1.5 mph and an incline of 0.5%. She may benefit from walking in the program more consistently. She also has kept her workloads consistent on the recumbent bike, T4 nustep, and biostep. We will continue to monitor her progress in the program.   Expected Outcomes Short: Use  RPE daily to regulate intensity.  Long: Follow program prescription in THR. Short: Review home exercise Long: Conitnue to attend rehab to build stamina Short: Continue initial exercise prescription Long: Improve overall strength and stamina Short: Walk at home on days away from rehab. Long: Continue to improve strength and stamina. Short: Walk more consistently in rehab, progressively increase workloads. Long: Continue to increase overall MET level and stamina.    Row Name 11/02/22 1603 11/07/22 0815 11/20/22 1520 12/08/22 0718       Exercise Goal Re-Evaluation   Exercise Goals Review Increase Physical Activity;Increase Strength and Stamina;Understanding of Exercise Prescription Increase Physical Activity;Increase Strength and Stamina;Understanding of Exercise Prescription Increase Physical Activity;Increase Strength and Stamina;Understanding of Exercise Prescription Increase Physical  Activity;Increase Strength and Stamina;Understanding of Exercise Prescription    Comments Bhargavi is doing well with exercise. She feels her stamina has improved as she is able to exercise for longer bouts. She reports that she tries to push herself more while she is at rehab because she knows she is being monitored. She also states that she has been walking at home on days that she doesn't come to rehab. She has kettlebell weights that she uses for resistance training at home as well. Shayanna is doing well in rehab.  She is up to level 5 on the NuStep.  We will encourage her to continue to build up workloads and weights.  We will conitnue to monitor her progress. Kathyria is doing well in the program. She recently increased her average overall MET level to 3.3 METs. She also increased her treadmill workload to a speed of 2.3 mph and an incline of 1%. She improved to level 3.2 on the recumbent bike as well. We will continue to monitor her progress in the program. Julysa continues to do well in the program and is close to graduating. She continues to walk at a speed of 2.3 mph on the treadmill with an increased incline of 2%. She also completed her post and improved by 22%! We will continue to monitor her progress in the program.    Expected Outcomes Short: Continue to walk on days away from rehab. Long: Continue to improve strength and stamina. Short: Increase hand weights Long: Continue to improve stamina Short: Continue to progressively increase treadmill workload. Long: Continue to improve strength and stamina. Short: Graduate. Long: Continue to exercise independently.             Discharge Exercise Prescription (Final Exercise Prescription Changes):  Exercise Prescription Changes - 12/08/22 0700       Response to Exercise   Blood Pressure (Admit) 118/72    Blood Pressure (Exit) 118/64    Heart Rate (Admit) 86 bpm    Heart Rate (Exercise) 115 bpm    Heart Rate (Exit) 80 bpm    Rating of  Perceived Exertion (Exercise) 13    Symptoms none    Duration Continue with 30 min of aerobic exercise without signs/symptoms of physical distress.    Intensity THRR unchanged      Progression   Progression Continue to progress workloads to maintain intensity without signs/symptoms of physical distress.    Average METs 3.09      Resistance Training   Training Prescription Yes    Weight 3 lb    Reps 10-15      Interval Training   Interval Training No      Treadmill   MPH 2.3    Grade 2    Minutes 15  METs 3.39      Recumbant Bike   Level 3.6    Watts 20    Minutes 15    METs 3.16      NuStep   Level 5    Minutes 15    METs 3.4      REL-XR   Level 2    Minutes 15      Home Exercise Plan   Plans to continue exercise at Home (comment)   Walking, weights, yoga   Frequency Add 2 additional days to program exercise sessions.    Initial Home Exercises Provided 10/11/22      Oxygen   Maintain Oxygen Saturation 88% or higher             Nutrition:  Target Goals: Understanding of nutrition guidelines, daily intake of sodium 1500mg , cholesterol 200mg , calories 30% from fat and 7% or less from saturated fats, daily to have 5 or more servings of fruits and vegetables.  Education: All About Nutrition: -Group instruction provided by verbal, written material, interactive activities, discussions, models, and posters to present general guidelines for heart healthy nutrition including fat, fiber, MyPlate, the role of sodium in heart healthy nutrition, utilization of the nutrition label, and utilization of this knowledge for meal planning. Follow up email sent as well. Written material given at graduation. Flowsheet Row Cardiac Rehab from 12/06/2022 in [facility] Cardiac and Pulmonary Rehab  Education need identified 09/18/22       Biometrics:  Pre Biometrics - 12/04/22 1556       Pre Biometrics   Height 5' 3.5" (1.613 m)    Weight 118 lb 4.8 oz (53.7 kg)    Waist  Circumference 28 inches    Hip Circumference 36.5 inches    Waist to Hip Ratio 0.77 %    BMI (Calculated) 20.62    Single Leg Stand 30 seconds              Nutrition Therapy Plan and Nutrition Goals:  Nutrition Therapy & Goals - 09/18/22 1657       Intervention Plan   Intervention Prescribe, educate and counsel regarding individualized specific dietary modifications aiming towards targeted core components such as weight, hypertension, lipid management, diabetes, heart failure and other comorbidities.    Expected Outcomes Short Term Goal: Understand basic principles of dietary content, such as calories, fat, sodium, cholesterol and nutrients.;Short Term Goal: A plan has been developed with personal nutrition goals set during dietitian appointment.;Long Term Goal: Adherence to prescribed nutrition plan.             Nutrition Assessments:  MEDIFICTS Score Key: ?70 Need to make dietary changes  40-70 Heart Healthy Diet ? 40 Therapeutic Level Cholesterol Diet  Flowsheet Row Cardiac Rehab from 09/18/2022 in [facility] Cardiac and Pulmonary Rehab  Picture Your Plate Total Score on Admission 82      Picture Your Plate Scores: <57 Unhealthy dietary pattern with much room for improvement. 41-50 Dietary pattern unlikely to meet recommendations for good health and room for improvement. 51-60 More healthful dietary pattern, with some room for improvement.  >60 Healthy dietary pattern, although there may be some specific behaviors that could be improved.    Nutrition Goals Re-Evaluation:  Nutrition Goals Re-Evaluation     Row Name 10/09/22 1551 11/02/22 1623           Goals   Comment Thyme is off to a good start in rehab  She was not able to meet with dietitian before they left  but would like once new one starts.  She feels that she is already doing well on her heart healthy diet.  She avoids processed foods and usually sticks to organics. She eats lots of fruits and vegetables.   She also has cut way back on sugar but will treat herself on occassion. Kanon was not able to speak with the RD before she left. However, she states that she is interested in speaking with the new RD about some heart healthy eating patterns once he starts.      Expected Outcome Short: Continue with diet Long: Continue to avoid sugar Short: Speak with new RD. Long: Continue to practice heart healthy eating patterns.               Nutrition Goals Discharge (Final Nutrition Goals Re-Evaluation):  Nutrition Goals Re-Evaluation - 11/02/22 1623       Goals   Comment Umaiza was not able to speak with the RD before she left. However, she states that she is interested in speaking with the new RD about some heart healthy eating patterns once he starts.    Expected Outcome Short: Speak with new RD. Long: Continue to practice heart healthy eating patterns.             Psychosocial: Target Goals: Acknowledge presence or absence of significant depression and/or stress, maximize coping skills, provide positive support system. Participant is able to verbalize types and ability to use techniques and skills needed for reducing stress and depression.   Education: Stress, Anxiety, and Depression - Group verbal and visual presentation to define topics covered.  Reviews how body is impacted by stress, anxiety, and depression.  Also discusses healthy ways to reduce stress and to treat/manage anxiety and depression.  Written material given at graduation. Flowsheet Row Cardiac Rehab from 12/06/2022 in [facility] Cardiac and Pulmonary Rehab  Date 10/25/22  Educator KW  Instruction Review Code 1- [employer] Understanding       Education: Sleep Hygiene -Provides group verbal and written instruction about how sleep can affect your health.  Define sleep hygiene, discuss sleep cycles and impact of sleep habits. Review good sleep hygiene tips.    Initial Review & Psychosocial Screening:  Initial Psych Review &  Screening - 09/11/22 1025       Initial Review   Current issues with Current Stress Concerns    Source of Stress Concerns Chronic Illness    Comments She has been more frustrated with her limitations and her health. She is not able to do the things that she wants to do. For the past two years she has been trying to find out what is wrong with her and did not find it until now. Her doctor told her she still has a blockage and cant to anything about it now.      Family Dynamics   Good Support System? Yes    Comments Clancey can look to her four kids for support and are helpful.             Quality of Life Scores:   Quality of Life - 09/18/22 1658       Quality of Life   Select Quality of Life      Quality of Life Scores   Health/Function Pre 14.07 %    Socioeconomic Pre 26.08 %    Psych/Spiritual Pre 25 %    Family Pre 28.5 %    GLOBAL Pre 20.58 %  Scores of 19 and below usually indicate a poorer quality of life in these areas.  A difference of  2-3 points is a clinically meaningful difference.  A difference of 2-3 points in the total score of the Quality of Life Index has been associated with significant improvement in overall quality of life, self-image, physical symptoms, and general health in studies assessing change in quality of life.  PHQ-9: Review Flowsheet       09/18/2022  Depression screen PHQ 2/9  Decreased Interest 0  Down, Depressed, Hopeless 0  PHQ - 2 Score 0  Altered sleeping 0  Tired, decreased energy 1  Change in appetite 0  Feeling bad or failure about yourself  0  Trouble concentrating 0  Moving slowly or fidgety/restless 0  Suicidal thoughts 0  PHQ-9 Score 1  Difficult doing work/chores Not difficult at all   Interpretation of Total Score  Total Score Depression Severity:  1-4 = Minimal depression, 5-9 = Mild depression, 10-14 = Moderate depression, 15-19 = Moderately severe depression, 20-27 = Severe depression   Psychosocial  Evaluation and Intervention:  Psychosocial Evaluation - 09/11/22 1032       Psychosocial Evaluation & Interventions   Interventions Encouraged to exercise with the program and follow exercise prescription;Relaxation education;Stress management education    Comments She has been more frustrated with her limitations and her health. She is not able to do the things that she wants to do. For the past two years she has been trying to find out what is wrong with her and did not find it until now. Her doctor told her she still has a blockage and cant to anything about it now.Shital can look to her four kids for support and are helpful.    Expected Outcomes Short: Start [facility] to help with mood. Long: Maintain a healthy mental state    Continue Psychosocial Services  Follow up required by staff             Psychosocial Re-Evaluation:  Psychosocial Re-Evaluation     Row Name 10/09/22 1548 11/02/22 1607           Psychosocial Re-Evaluation   Current issues with Current Stress Concerns Current Stress Concerns      Comments Ithzel is doing well in rehab so far.  She is doing well mentally for most part.  She has days that she gets really frustrated with her fatigue levels and not being able to go and do like she used to.  She wants to continue to build back up to get back to being active again. She is sleeping pretty good most of the time.  She will wake about once a night, but usually able to get back to sleep easily. She is still concerned about her LAD not being fully functional, and has just had more testing with it. Tiarna is doing well mentally. She states that she has not been dealing with any major stressors at this time. She has just been frustrated with her cardiac health concerns. She states that she enjoys gardening and being in nature with her dogs for stress relief. She reports that her 4 children, friends, and her dogs are a good support for her. She reports that she is sleeping  well, although she does wake up in the middle of the night occasionally.      Expected Outcomes Short: Follow up on artery workup and exercise for mental boost Long: Continue to build back up to being able to go and  do Short: Continue to find healthy ways to relieve stress. Long: Continue to maintain positive outlook.      Interventions Encouraged to attend Cardiac Rehabilitation for the exercise Encouraged to attend Cardiac Rehabilitation for the exercise      Continue Psychosocial Services  Follow up required by staff Follow up required by staff        Initial Review   Source of Stress Concerns Unable to participate in former interests or hobbies;Unable to perform yard/household activities Chronic Illness               Psychosocial Discharge (Final Psychosocial Re-Evaluation):  Psychosocial Re-Evaluation - 11/02/22 1607       Psychosocial Re-Evaluation   Current issues with Current Stress Concerns    Comments Filippa is doing well mentally. She states that she has not been dealing with any major stressors at this time. She has just been frustrated with her cardiac health concerns. She states that she enjoys gardening and being in nature with her dogs for stress relief. She reports that her 4 children, friends, and her dogs are a good support for her. She reports that she is sleeping well, although she does wake up in the middle of the night occasionally.    Expected Outcomes Short: Continue to find healthy ways to relieve stress. Long: Continue to maintain positive outlook.    Interventions Encouraged to attend Cardiac Rehabilitation for the exercise    Continue Psychosocial Services  Follow up required by staff      Initial Review   Source of Stress Concerns Chronic Illness             Vocational Rehabilitation: Provide vocational rehab assistance to qualifying candidates.   Vocational Rehab Evaluation & Intervention:   Education: Education Goals: Education classes will be  provided on a variety of topics geared toward better understanding of heart health and risk factor modification. Participant will state understanding/return demonstration of topics presented as noted by education test scores.  Learning Barriers/Preferences:  Learning Barriers/Preferences - 09/11/22 1024       Learning Barriers/Preferences   Learning Barriers None    Learning Preferences None             General Cardiac Education Topics:  AED/CPR: - Group verbal and written instruction with the use of models to demonstrate the basic use of the AED with the basic ABC's of resuscitation.   Anatomy and Cardiac Procedures: - Group verbal and visual presentation and models provide information about basic cardiac anatomy and function. Reviews the testing methods done to diagnose heart disease and the outcomes of the test results. Describes the treatment choices: Medical Management, Angioplasty, or Coronary Bypass Surgery for treating various heart conditions including Myocardial Infarction, Angina, Valve Disease, and Cardiac Arrhythmias.  Written material given at graduation. Flowsheet Row Cardiac Rehab from 12/06/2022 in [facility] Cardiac and Pulmonary Rehab  Date 12/06/22  Educator MS  Instruction Review Code 1- Verbalizes Understanding       Medication Safety: - Group verbal and visual instruction to review commonly prescribed medications for heart and lung disease. Reviews the medication, class of the drug, and side effects. Includes the steps to properly store meds and maintain the prescription regimen.  Written material given at graduation. Flowsheet Row Cardiac Rehab from 12/06/2022 in [facility] Cardiac and Pulmonary Rehab  Date 10/04/22  Educator SB  Instruction Review Code 1- Verbalizes Understanding       Intimacy: - Group verbal instruction through game format to discuss how heart  and lung disease can affect sexual intimacy. Written material given at graduation.. Flowsheet Row  Cardiac Rehab from 12/06/2022 in [facility] Cardiac and Pulmonary Rehab  Date 11/08/22  Educator [facility]  Instruction Review Code 1- Verbalizes Understanding       Know Your Numbers and Heart Failure: - Group verbal and visual instruction to discuss disease risk factors for cardiac and pulmonary disease and treatment options.  Reviews associated critical values for Overweight/Obesity, Hypertension, Cholesterol, and Diabetes.  Discusses basics of heart failure: signs/symptoms and treatments.  Introduces Heart Failure Zone chart for action plan for heart failure.  Written material given at graduation. Flowsheet Row Cardiac Rehab from 12/06/2022 in [facility] Cardiac and Pulmonary Rehab  Date 10/11/22  Educator MS  Instruction Review Code 1- Verbalizes Understanding       Infection Prevention: - Provides verbal and written material to individual with discussion of infection control including proper hand washing and proper equipment cleaning during exercise session. Flowsheet Row Cardiac Rehab from 12/06/2022 in [facility] Cardiac and Pulmonary Rehab  Date 09/18/22  Educator [facility]  Instruction Review Code 1- Verbalizes Understanding       Falls Prevention: - Provides verbal and written material to individual with discussion of falls prevention and safety. Flowsheet Row Cardiac Rehab from 12/06/2022 in [facility] Cardiac and Pulmonary Rehab  Date 09/18/22  Educator [facility]  Instruction Review Code 1- Verbalizes Understanding       Other: -Provides group and verbal instruction on various topics (see comments) Flowsheet Row Cardiac Rehab from 12/06/2022 in [facility] Cardiac and Pulmonary Rehab  Date 11/15/22  [11/29/22- Jeopardy]  Educator Relaxation- [facility]  Instruction Review Code 1- Verbalizes Understanding       Knowledge Questionnaire Score:  Knowledge Questionnaire Score - 09/18/22 1658       Knowledge Questionnaire Score   Pre Score 23/26             Core Components/Risk Factors/Patient Goals at Admission:   Personal Goals and Risk Factors at Admission - 09/18/22 1657       Core Components/Risk Factors/Patient Goals on Admission    Weight Management Yes;Weight Gain    Intervention Weight Management: Develop a combined nutrition and exercise program designed to reach desired caloric intake, while maintaining appropriate intake of nutrient and fiber, sodium and fats, and appropriate energy expenditure required for the weight goal.;Weight Management: Provide education and appropriate resources to help participant work on and attain dietary goals.;Weight Management/Obesity: Establish reasonable short term and long term weight goals.    Admit Weight 116 lb 11.2 oz (52.9 kg)    Goal Weight: Short Term 120 lb (54.4 kg)    Goal Weight: Long Term 120 lb (54.4 kg)    Expected Outcomes Short Term: Continue to assess and modify interventions until short term weight is achieved;Long Term: Adherence to nutrition and physical activity/exercise program aimed toward attainment of established weight goal;Weight Maintenance: Understanding of the daily nutrition guidelines, which includes 25-35% calories from fat, 7% or less cal from saturated fats, less than 200mg  cholesterol, less than 1.5gm of sodium, & 5 or more servings of fruits and vegetables daily;Understanding recommendations for meals to include 15-35% energy as protein, 25-35% energy from fat, 35-60% energy from carbohydrates, less than 200mg  of dietary cholesterol, 20-35 gm of total fiber daily;Understanding of distribution of calorie intake throughout the day with the consumption of 4-5 meals/snacks;Weight Gain: Understanding of general recommendations for a high calorie, high protein meal plan that promotes weight gain by distributing calorie intake throughout the day with  the consumption for 4-5 meals, snacks, and/or supplements    Hypertension Yes    Intervention Provide education on lifestyle modifcations including regular physical activity/exercise, weight  management, moderate sodium restriction and increased consumption of fresh fruit, vegetables, and low fat dairy, alcohol moderation, and smoking cessation.;Monitor prescription use compliance.    Expected Outcomes Short Term: Continued assessment and intervention until BP is < 140/47mm HG in hypertensive participants. < 130/72mm HG in hypertensive participants with diabetes, heart failure or chronic kidney disease.;Long Term: Maintenance of blood pressure at goal levels.    Lipids Yes    Intervention Provide education and support for participant on nutrition & aerobic/resistive exercise along with prescribed medications to achieve LDL 70mg , HDL >40mg .    Expected Outcomes Short Term: Participant states understanding of desired cholesterol values and is compliant with medications prescribed. Participant is following exercise prescription and nutrition guidelines.;Long Term: Cholesterol controlled with medications as prescribed, with individualized exercise RX and with personalized nutrition plan. Value goals: LDL < 70mg , HDL > 40 mg.             Education:Diabetes - Individual verbal and written instruction to review signs/symptoms of diabetes, desired ranges of glucose level fasting, after meals and with exercise. Acknowledge that pre and post exercise glucose checks will be done for 3 sessions at entry of program.   Core Components/Risk Factors/Patient Goals Review:   Goals and Risk Factor Review     Row Name 10/09/22 1555 11/02/22 1613           Core Components/Risk Factors/Patient Goals Review   Personal Goals Review Weight Management/Obesity;Hypertension Weight Management/Obesity;Hypertension      Review Reginald is off to a good start in rehab.  Her weight is staying fairly steady between 115-118 lb.  Her blood pressures are doing well overall, on the upper end of normal.  She is checking them at home.  She is usually in the 130s but has gotten up to 180s on occassion.  She was  encouraged to bring her cuff into class to check again what we are getting as well. Trania is happy with where her weight is at this time and states that she feels she is at her optimal weight. She would like to maintain her weight right around 120 lbs. She is checking her BP at home and reports that they are staying within normal ranges. She states that her systolic BP has been between 120-135 and her diastolic BP has been between 70-80. Her BP has been within normal ranges in rehab as well.      Expected Outcomes Short: Bring in blood pressure cuff to check Long: conitnue to maintain weight Short: Continue to monitor BP at home. Long: Continue to maintain weight.               Core Components/Risk Factors/Patient Goals at Discharge (Final Review):   Goals and Risk Factor Review - 11/02/22 1613       Core Components/Risk Factors/Patient Goals Review   Personal Goals Review Weight Management/Obesity;Hypertension    Review Adayah is happy with where her weight is at this time and states that she feels she is at her optimal weight. She would like to maintain her weight right around 120 lbs. She is checking her BP at home and reports that they are staying within normal ranges. She states that her systolic BP has been between 120-135 and her diastolic BP has been between 70-80. Her BP has been within normal ranges in rehab as well.  Expected Outcomes Short: Continue to monitor BP at home. Long: Continue to maintain weight.             ITP Comments:  ITP Comments     Row Name 09/11/22 1024 09/18/22 1650 09/20/22 1424 09/25/22 1600 10/18/22 0945   ITP Comments Virtual Visit completed. Patient informed on EP and RD appointment and 6 Minute walk test. Patient also informed of patient health questionnaires on My Chart. Patient Verbalizes understanding. Visit diagnosis can be found in [facility] 08/13/2022. Completed and gym orientation. Initial ITP created and sent for review to Dr. Bethann Punches,  Medical Director. 30 day review completed. ITP sent to Dr. Bethann Punches, Medical Director of Cardiac Rehab. Continue with ITP unless changes are made by physician.   Pt new to program. First full day of exercise!  Patient was oriented to gym and equipment including functions, settings, policies, and procedures.  Patient's individual exercise prescription and treatment plan were reviewed.  All starting workloads were established based on the results of the 6 minute walk test done at initial orientation visit.  The plan for exercise progression was also introduced and progression will be customized based on patient's performance and goals. 30 Day review completed. Medical Director ITP review done, changes made as directed, and signed approval by Medical Director.    new to program    Row Name 11/15/22 1137 12/12/22 1510         ITP Comments 30 Day review completed. Medical Director ITP review done, changes made as directed, and signed approval by Medical Director. 30 Day review completed. Medical Director ITP review done, changes made as directed, and signed approval by Medical Director.               Comments:

## 2022-12-20 ENCOUNTER — Encounter: Payer: No Typology Code available for payment source | Admitting: *Deleted

## 2022-12-20 DIAGNOSIS — Z955 Presence of coronary angioplasty implant and graft: Secondary | ICD-10-CM

## 2022-12-20 DIAGNOSIS — I213 ST elevation (STEMI) myocardial infarction of unspecified site: Secondary | ICD-10-CM

## 2022-12-20 NOTE — Progress Notes (Signed)
Daily Session Note  Patient Details  Name: Stacey Francis MRN: [id number] Date of Birth: 23-Sep-1955 Referring Provider:   Flowsheet Row Cardiac Rehab from 09/18/2022 in [facility] Cardiac and Pulmonary Rehab  Referring Provider callwood       Encounter Date: 12/20/2022  Check In:  Session Check In - 12/20/22 1531       Check-In   Supervising physician immediately available to respond to emergencies See telemetry face sheet for immediately available ER MD    Location [facility]-Cardiac & Pulmonary Rehab    Staff Present Hulen Luster, BS, RRT, CPFT;Toi Stelly Jewel Baize, RN BSN;Joseph Reino Kent, RCP,RRT,BSRT    Virtual Visit No    Medication changes reported     No    Fall or balance concerns reported    No    Warm-up and Cool-down Performed on first and last piece of equipment    Resistance Training Performed Yes    VAD Patient? No    PAD/SET Patient? No      Pain Assessment   Currently in Pain? No/denies                Social History   Tobacco Use  Smoking Status Never  Smokeless Tobacco Never    Goals Met:  Independence with exercise equipment Exercise tolerated well No report of concerns or symptoms today Strength training completed today  Goals Unmet:  Not Applicable  Comments: Pt able to follow exercise prescription today without complaint.  Will continue to monitor for progression.    Dr. Bethann Punches is Medical Director for [facility] Cardiac Rehabilitation.  Dr. Vida Rigger is Medical Director for [facility] Pulmonary Rehabilitation.

## 2022-12-21 ENCOUNTER — Encounter: Payer: No Typology Code available for payment source | Admitting: *Deleted

## 2022-12-21 DIAGNOSIS — I213 ST elevation (STEMI) myocardial infarction of unspecified site: Secondary | ICD-10-CM

## 2022-12-21 DIAGNOSIS — Z955 Presence of coronary angioplasty implant and graft: Secondary | ICD-10-CM

## 2022-12-21 NOTE — Progress Notes (Signed)
Daily Session Note  Patient Details  Name: Stacey Francis MRN: [id number] Date of Birth: [birth year] Referring Provider:   Flowsheet Row Cardiac Rehab from 09/18/2022 in [facility] Cardiac and Pulmonary Rehab  Referring Provider callwood       Encounter Date: 12/21/2022  Check In:  Session Check In - 12/21/22 1532       Check-In   Supervising physician immediately available to respond to emergencies See telemetry face sheet for immediately available ER MD    Location [facility]-Cardiac & Pulmonary Rehab    Staff Present Elige Ko, RCP,RRT,BSRT;Cora Collum, RN, BSN, CCRP;Areeb Corron Jewel Baize, RN BSN    Virtual Visit No    Medication changes reported     No    Fall or balance concerns reported    No    Warm-up and Cool-down Performed on first and last piece of equipment    Resistance Training Performed Yes    VAD Patient? No    PAD/SET Patient? No      Pain Assessment   Currently in Pain? No/denies                Social History   Tobacco Use  Smoking Status Never  Smokeless Tobacco Never    Goals Met:  Independence with exercise equipment Exercise tolerated well No report of concerns or symptoms today Strength training completed today  Goals Unmet:  Not Applicable  Comments: Pt able to follow exercise prescription today without complaint.  Will continue to monitor for progression.    Dr. Bethann Punches is Medical Director for [facility] Cardiac Rehabilitation.  Dr. Vida Rigger is Medical Director for [facility] Pulmonary Rehabilitation.

## 2022-12-25 ENCOUNTER — Encounter: Payer: No Typology Code available for payment source | Admitting: *Deleted

## 2022-12-25 DIAGNOSIS — I213 ST elevation (STEMI) myocardial infarction of unspecified site: Secondary | ICD-10-CM | POA: Diagnosis not present

## 2022-12-25 DIAGNOSIS — Z955 Presence of coronary angioplasty implant and graft: Secondary | ICD-10-CM

## 2022-12-25 NOTE — Progress Notes (Signed)
Cardiac Individual Treatment Plan  Patient Details  Name: Stacey Francis MRN: [id number] Date of Birth: Aug 24, 1955 Referring Provider:   Flowsheet Row Cardiac Rehab from 09/18/2022 in [facility] Cardiac and Pulmonary Rehab  Referring Provider callwood       Initial Encounter Date:  Flowsheet Row Cardiac Rehab from 09/18/2022 in [facility] Cardiac and Pulmonary Rehab  Date 09/18/22       Visit Diagnosis: ST elevation myocardial infarction (STEMI), unspecified artery [facility])  Status post coronary artery stent placement  Patient's Home Medications on Admission:  Current Outpatient Medications:    aspirin 81 MG chewable tablet, Chew 1 tablet (81 mg total) by mouth daily. (Patient not taking: Reported on 09/11/2022), Disp: 90 tablet, Rfl: 3   atorvastatin (LIPITOR) 80 MG tablet, Take 1 tablet (80 mg total) by mouth daily., Disp: 30 tablet, Rfl: 11   cetirizine (ZYRTEC) 10 MG tablet, Take by mouth., Disp: , Rfl:    clopidogrel (PLAVIX) 75 MG tablet, Take by mouth., Disp: , Rfl:    levothyroxine (SYNTHROID) 25 MCG tablet, Take 25 mcg by mouth daily before breakfast., Disp: , Rfl:    levothyroxine (SYNTHROID) 25 MCG tablet, Take by mouth. (Patient not taking: Reported on 09/11/2022), Disp: , Rfl:    lisinopril (ZESTRIL) 2.5 MG tablet, Take 1 tablet (2.5 mg total) by mouth daily., Disp: 30 tablet, Rfl: 2   metoprolol succinate (TOPROL-XL) 25 MG 24 hr tablet, Take by mouth., Disp: , Rfl:    metoprolol tartrate (LOPRESSOR) 25 MG tablet, Take 0.5 tablets (12.5 mg total) by mouth 2 (two) times daily. (Patient not taking: Reported on 09/11/2022), Disp: 30 tablet, Rfl: 5   metoprolol tartrate (LOPRESSOR) 25 MG tablet, Take by mouth. (Patient not taking: Reported on 09/11/2022), Disp: , Rfl:    naproxen (NAPROSYN) 500 MG tablet, Take 500 mg by mouth 2 (two) times daily., Disp: , Rfl:    nitroGLYCERIN (NITROSTAT) 0.4 MG SL tablet, Place 1 tablet (0.4 mg total) under the tongue every 5 (five) minutes as needed for chest  pain., Disp: 30 tablet, Rfl: 0   ticagrelor (BRILINTA) 90 MG TABS tablet, Take 1 tablet (90 mg total) by mouth 2 (two) times daily. (Patient not taking: Reported on 09/11/2022), Disp: 180 tablet, Rfl: 3  Past Medical History: Past Medical History:  Diagnosis Date   Hypertension    Sinus infection     Tobacco Use: Social History   Tobacco Use  Smoking Status Never  Smokeless Tobacco Never    Labs: Review Flowsheet       Latest Ref Rng & Units 08/13/2022 08/14/2022  Labs for ITP Cardiac and Pulmonary Rehab  Cholestrol 0 - 200 mg/dL 409  -  LDL (calc) 0 - 99 mg/dL 811  -  HDL-C >91 mg/dL 84  -  Trlycerides <478 mg/dL 70  -  Hemoglobin G9F 4.8 - 5.6 % - 5.7     Details             Exercise Target Goals: Exercise Program Goal: Individual exercise prescription set using results from initial 6 min walk test and THRR while considering  patient's activity barriers and safety.   Exercise Prescription Goal: Initial exercise prescription builds to 30-45 minutes a day of aerobic activity, 2-3 days per week.  Home exercise guidelines will be given to patient during program as part of exercise prescription that the participant will acknowledge.   Education: Aerobic Exercise: - Group verbal and visual presentation on the components of exercise prescription. Introduces F.I.T.T principle from  ACSM for exercise prescriptions.  Reviews F.I.T.T. principles of aerobic exercise including progression. Written material given at graduation. Flowsheet Row Cardiac Rehab from 12/06/2022 in [facility] Cardiac and Pulmonary Rehab  Education need identified 09/18/22  Date 11/08/22  Educator [facility]  Instruction Review Code 1- [employer] Understanding       Education: Resistance Exercise: - Group verbal and visual presentation on the components of exercise prescription. Introduces F.I.T.T principle from ACSM for exercise prescriptions  Reviews F.I.T.T. principles of resistance exercise including progression.  Written material given at graduation.    Education: Exercise & Equipment Safety: - Individual verbal instruction and demonstration of equipment use and safety with use of the equipment. Flowsheet Row Cardiac Rehab from 12/06/2022 in [facility] Cardiac and Pulmonary Rehab  Date 09/18/22  Educator [facility]  Instruction Review Code 1- Verbalizes Understanding       Education: Exercise Physiology & General Exercise Guidelines: - Group verbal and written instruction with models to review the exercise physiology of the cardiovascular system and associated critical values. Provides general exercise guidelines with specific guidelines to those with heart or lung disease.  Flowsheet Row Cardiac Rehab from 12/06/2022 in [facility] Cardiac and Pulmonary Rehab  Education need identified 09/18/22  Date 11/01/22  Educator [facility]  Instruction Review Code 1- [employer] Understanding       Education: Flexibility, Balance, Mind/Body Relaxation: - Group verbal and visual presentation with interactive activity on the components of exercise prescription. Introduces F.I.T.T principle from ACSM for exercise prescriptions. Reviews F.I.T.T. principles of flexibility and balance exercise training including progression. Also discusses the mind body connection.  Reviews various relaxation techniques to help reduce and manage stress (i.e. Deep breathing, progressive muscle relaxation, and visualization). Balance handout provided to take home. Written material given at graduation. Flowsheet Row Cardiac Rehab from 12/06/2022 in [facility] Cardiac and Pulmonary Rehab  Date 11/22/22  Educator NT  Instruction Review Code 1- Verbalizes Understanding       Activity Barriers & Risk Stratification:  Activity Barriers & Cardiac Risk Stratification - 09/18/22 1652       Activity Barriers & Cardiac Risk Stratification   Activity Barriers None    Cardiac Risk Stratification High             6 Minute Walk:  6 Minute Walk     Row Name  09/18/22 1651 12/04/22 1554       6 Minute Walk   Phase Initial Discharge    Distance 1135 feet 1385 feet    Distance % Change -- 22 %    Distance Feet Change -- 250 ft    Walk Time 6 minutes 6 minutes    # of Rest Breaks 0 0    MPH 2.15 2.62    METS 3.18 3.44    RPE 9 10    Perceived Dyspnea  0 0    VO2 Peak 11.13 12.04    Symptoms No No    Resting HR 83 bpm 78 bpm    Resting BP 132/82 112/62    Resting Oxygen Saturation  99 % 99 %    Exercise Oxygen Saturation  during 6 min walk 97 % 99 %    Max Ex. HR 111 bpm 99 bpm    Max Ex. BP 130/82 124/82    2 Minute Post BP 118/62 118/70             6 Minute Walk     Row Name 09/18/22 1651 12/04/22 1554       6 Minute  Walk   Phase Initial Discharge    Distance 1135 feet 1385 feet    Distance % Change -- 22 %    Distance Feet Change -- 250 ft    Walk Time 6 minutes 6 minutes    # of Rest Breaks 0 0    MPH 2.15 2.62    METS 3.18 3.44    RPE 9 10    Perceived Dyspnea  0 0    VO2 Peak 11.13 12.04    Symptoms No No    Resting HR 83 bpm 78 bpm    Resting BP 132/82 112/62    Resting Oxygen Saturation  99 % 99 %    Exercise Oxygen Saturation  during 6 min walk 97 % 99 %    Max Ex. HR 111 bpm 99 bpm    Max Ex. BP 130/82 124/82    2 Minute Post BP 118/62 118/70             Oxygen Initial Assessment:   Oxygen Re-Evaluation:   Oxygen Discharge (Final Oxygen Re-Evaluation):   Initial Exercise Prescription:  Initial Exercise Prescription - 09/18/22 1600       Date of Initial Exercise RX and Referring Provider   Date 09/18/22    Referring Provider callwood      Oxygen   Maintain Oxygen Saturation 88% or higher      Treadmill   MPH 2    Grade 1    Minutes 15    METs 2.81      Recumbant Bike   Level 2    RPM 50    Watts 20    Minutes 15    METs 3.18      NuStep   Level 2    SPM 80    Minutes 15    METs 3.18      REL-XR   Level 2    Speed 50    Minutes 15    METs 3.18      T5 Nustep    Level 1    SPM 80    Minutes 15    METs 3.18      Prescription Details   Frequency (times per week) 3    Duration Progress to 30 minutes of continuous aerobic without signs/symptoms of physical distress      Intensity   THRR 40-80% of Max Heartrate 111-139    Ratings of Perceived Exertion 11-13    Perceived Dyspnea 0-4      Progression   Progression Continue to progress workloads to maintain intensity without signs/symptoms of physical distress.      Resistance Training   Training Prescription Yes    Weight 3    Reps 10-15             Perform Capillary Blood Glucose checks as needed.  Exercise Prescription Changes:   Exercise Prescription Changes     Row Name 09/18/22 1600 10/09/22 1500 10/11/22 1600 10/24/22 0700 11/07/22 0800     Response to Exercise   Blood Pressure (Admit) 132/82 126/68 -- 118/60 112/64   Blood Pressure (Exercise) 130/82 146/82 -- 148/60 --   Blood Pressure (Exit) 118/62 112/62 -- 112/64 102/60   Heart Rate (Admit) 83 bpm 63 bpm -- 85 bpm 84 bpm   Heart Rate (Exercise) 111 bpm 111 bpm -- 122 bpm 119 bpm   Heart Rate (Exit) 80 bpm 82 bpm -- 94 bpm 97 bpm   Oxygen Saturation (Admit) 99 % -- -- -- --  Oxygen Saturation (Exercise) 97 % -- -- -- --   Oxygen Saturation (Exit) 100 % -- -- -- --   Rating of Perceived Exertion (Exercise) 9 13 -- 13 13   Perceived Dyspnea (Exercise) 0 -- -- -- --   Symptoms none none -- none none   Comments 6 MWT results 2nd full day of exercise -- -- --   Duration -- Progress to 30 minutes of  aerobic without signs/symptoms of physical distress -- Continue with 30 min of aerobic exercise without signs/symptoms of physical distress. Continue with 30 min of aerobic exercise without signs/symptoms of physical distress.   Intensity -- THRR unchanged -- THRR unchanged THRR unchanged     Progression   Progression -- Continue to progress workloads to maintain intensity without signs/symptoms of physical distress. --  Continue to progress workloads to maintain intensity without signs/symptoms of physical distress. Continue to progress workloads to maintain intensity without signs/symptoms of physical distress.   Average METs -- 2.57 -- 2.78 3.03     Resistance Training   Training Prescription -- Yes -- Yes Yes   Weight -- 3 lb -- 3 lb 3 lb   Reps -- 10-15 -- 10-15 10-15     Interval Training   Interval Training -- No -- No No     Treadmill   MPH -- -- -- 1.5 --   Grade -- -- -- 0.5 --   Minutes -- -- -- 15 --   METs -- -- -- 2.25 --     Recumbant Bike   Level -- 1.2 -- 2 2   Watts -- 20 -- 20 --   Minutes -- 15 -- 15 15   METs -- 2.6 -- 2.4 3.4     NuStep   Level -- 4 -- 4 5   Minutes -- 15 -- 15 15   METs -- 2.7 -- 3 3.7     Biostep-RELP   Level -- -- -- 2 2   Minutes -- -- -- 15 15   METs -- -- -- 3 2     Home Exercise Plan   Plans to continue exercise at -- -- Home (comment)  Walking, weights, yoga Home (comment)  Walking, weights, yoga Home (comment)  Walking, weights, yoga   Frequency -- -- Add 2 additional days to program exercise sessions. Add 2 additional days to program exercise sessions. Add 2 additional days to program exercise sessions.   Initial Home Exercises Provided -- -- 10/11/22 10/11/22 10/11/22     Oxygen   Maintain Oxygen Saturation -- 88% or higher 88% or higher 88% or higher 88% or higher    Row Name 11/20/22 1500 12/08/22 0700           Response to Exercise   Blood Pressure (Admit) 124/62 118/72      Blood Pressure (Exit) 124/62 118/64      Heart Rate (Admit) 72 bpm 86 bpm      Heart Rate (Exercise) 112 bpm 115 bpm      Heart Rate (Exit) 89 bpm 80 bpm      Rating of Perceived Exertion (Exercise) 13 13      Symptoms none none      Duration Continue with 30 min of aerobic exercise without signs/symptoms of physical distress. Continue with 30 min of aerobic exercise without signs/symptoms of physical distress.      Intensity THRR unchanged THRR unchanged         Progression   Progression Continue to  progress workloads to maintain intensity without signs/symptoms of physical distress. Continue to progress workloads to maintain intensity without signs/symptoms of physical distress.      Average METs 3.3 3.09        Resistance Training   Training Prescription Yes Yes      Weight 3 lb 3 lb      Reps 10-15 10-15        Interval Training   Interval Training No No        Treadmill   MPH 2.3 2.3      Grade 1 2      Minutes 15 15      METs 3.08 3.39        Recumbant Bike   Level 3.2 3.6      Watts 20 20      Minutes 15 15      METs 3.4 3.16        NuStep   Level 5 5      Minutes 15 15      METs 3.9 3.4        REL-XR   Level -- 2      Minutes -- 15        Home Exercise Plan   Plans to continue exercise at Home (comment)  Walking, weights, yoga Home (comment)  Walking, weights, yoga      Frequency Add 2 additional days to program exercise sessions. Add 2 additional days to program exercise sessions.      Initial Home Exercises Provided 10/11/22 10/11/22        Oxygen   Maintain Oxygen Saturation 88% or higher 88% or higher               Exercise Prescription Changes     Row Name 09/18/22 1600 10/09/22 1500 10/11/22 1600 10/24/22 0700 11/07/22 0800     Response to Exercise   Blood Pressure (Admit) 132/82 126/68 -- 118/60 112/64   Blood Pressure (Exercise) 130/82 146/82 -- 148/60 --   Blood Pressure (Exit) 118/62 112/62 -- 112/64 102/60   Heart Rate (Admit) 83 bpm 63 bpm -- 85 bpm 84 bpm   Heart Rate (Exercise) 111 bpm 111 bpm -- 122 bpm 119 bpm   Heart Rate (Exit) 80 bpm 82 bpm -- 94 bpm 97 bpm   Oxygen Saturation (Admit) 99 % -- -- -- --   Oxygen Saturation (Exercise) 97 % -- -- -- --   Oxygen Saturation (Exit) 100 % -- -- -- --   Rating of Perceived Exertion (Exercise) 9 13 -- 13 13   Perceived Dyspnea (Exercise) 0 -- -- -- --   Symptoms none none -- none none   Comments 6 MWT results 2nd full day of exercise --  -- --   Duration -- Progress to 30 minutes of  aerobic without signs/symptoms of physical distress -- Continue with 30 min of aerobic exercise without signs/symptoms of physical distress. Continue with 30 min of aerobic exercise without signs/symptoms of physical distress.   Intensity -- THRR unchanged -- THRR unchanged THRR unchanged     Progression   Progression -- Continue to progress workloads to maintain intensity without signs/symptoms of physical distress. -- Continue to progress workloads to maintain intensity without signs/symptoms of physical distress. Continue to progress workloads to maintain intensity without signs/symptoms of physical distress.   Average METs -- 2.57 -- 2.78 3.03     Resistance Training   Training Prescription -- Yes -- Yes Yes   Weight --  3 lb -- 3 lb 3 lb   Reps -- 10-15 -- 10-15 10-15     Interval Training   Interval Training -- No -- No No     Treadmill   MPH -- -- -- 1.5 --   Grade -- -- -- 0.5 --   Minutes -- -- -- 15 --   METs -- -- -- 2.25 --     Recumbant Bike   Level -- 1.2 -- 2 2   Watts -- 20 -- 20 --   Minutes -- 15 -- 15 15   METs -- 2.6 -- 2.4 3.4     NuStep   Level -- 4 -- 4 5   Minutes -- 15 -- 15 15   METs -- 2.7 -- 3 3.7     Biostep-RELP   Level -- -- -- 2 2   Minutes -- -- -- 15 15   METs -- -- -- 3 2     Home Exercise Plan   Plans to continue exercise at -- -- Home (comment)  Walking, weights, yoga Home (comment)  Walking, weights, yoga Home (comment)  Walking, weights, yoga   Frequency -- -- Add 2 additional days to program exercise sessions. Add 2 additional days to program exercise sessions. Add 2 additional days to program exercise sessions.   Initial Home Exercises Provided -- -- 10/11/22 10/11/22 10/11/22     Oxygen   Maintain Oxygen Saturation -- 88% or higher 88% or higher 88% or higher 88% or higher    Row Name 11/20/22 1500 12/08/22 0700           Response to Exercise   Blood Pressure (Admit) 124/62  118/72      Blood Pressure (Exit) 124/62 118/64      Heart Rate (Admit) 72 bpm 86 bpm      Heart Rate (Exercise) 112 bpm 115 bpm      Heart Rate (Exit) 89 bpm 80 bpm      Rating of Perceived Exertion (Exercise) 13 13      Symptoms none none      Duration Continue with 30 min of aerobic exercise without signs/symptoms of physical distress. Continue with 30 min of aerobic exercise without signs/symptoms of physical distress.      Intensity THRR unchanged THRR unchanged        Progression   Progression Continue to progress workloads to maintain intensity without signs/symptoms of physical distress. Continue to progress workloads to maintain intensity without signs/symptoms of physical distress.      Average METs 3.3 3.09        Resistance Training   Training Prescription Yes Yes      Weight 3 lb 3 lb      Reps 10-15 10-15        Interval Training   Interval Training No No        Treadmill   MPH 2.3 2.3      Grade 1 2      Minutes 15 15      METs 3.08 3.39        Recumbant Bike   Level 3.2 3.6      Watts 20 20      Minutes 15 15      METs 3.4 3.16        NuStep   Level 5 5      Minutes 15 15      METs 3.9 3.4        REL-XR  Level -- 2      Minutes -- 15        Home Exercise Plan   Plans to continue exercise at Home (comment)  Walking, weights, yoga Home (comment)  Walking, weights, yoga      Frequency Add 2 additional days to program exercise sessions. Add 2 additional days to program exercise sessions.      Initial Home Exercises Provided 10/11/22 10/11/22        Oxygen   Maintain Oxygen Saturation 88% or higher 88% or higher               Exercise Prescription Changes     Row Name 09/18/22 1600 10/09/22 1500 10/11/22 1600 10/24/22 0700 11/07/22 0800     Response to Exercise   Blood Pressure (Admit) 132/82 126/68 -- 118/60 112/64   Blood Pressure (Exercise) 130/82 146/82 -- 148/60 --   Blood Pressure (Exit) 118/62 112/62 -- 112/64 102/60   Heart Rate  (Admit) 83 bpm 63 bpm -- 85 bpm 84 bpm   Heart Rate (Exercise) 111 bpm 111 bpm -- 122 bpm 119 bpm   Heart Rate (Exit) 80 bpm 82 bpm -- 94 bpm 97 bpm   Oxygen Saturation (Admit) 99 % -- -- -- --   Oxygen Saturation (Exercise) 97 % -- -- -- --   Oxygen Saturation (Exit) 100 % -- -- -- --   Rating of Perceived Exertion (Exercise) 9 13 -- 13 13   Perceived Dyspnea (Exercise) 0 -- -- -- --   Symptoms none none -- none none   Comments 6 MWT results 2nd full day of exercise -- -- --   Duration -- Progress to 30 minutes of  aerobic without signs/symptoms of physical distress -- Continue with 30 min of aerobic exercise without signs/symptoms of physical distress. Continue with 30 min of aerobic exercise without signs/symptoms of physical distress.   Intensity -- THRR unchanged -- THRR unchanged THRR unchanged     Progression   Progression -- Continue to progress workloads to maintain intensity without signs/symptoms of physical distress. -- Continue to progress workloads to maintain intensity without signs/symptoms of physical distress. Continue to progress workloads to maintain intensity without signs/symptoms of physical distress.   Average METs -- 2.57 -- 2.78 3.03     Resistance Training   Training Prescription -- Yes -- Yes Yes   Weight -- 3 lb -- 3 lb 3 lb   Reps -- 10-15 -- 10-15 10-15     Interval Training   Interval Training -- No -- No No     Treadmill   MPH -- -- -- 1.5 --   Grade -- -- -- 0.5 --   Minutes -- -- -- 15 --   METs -- -- -- 2.25 --     Recumbant Bike   Level -- 1.2 -- 2 2   Watts -- 20 -- 20 --   Minutes -- 15 -- 15 15   METs -- 2.6 -- 2.4 3.4     NuStep   Level -- 4 -- 4 5   Minutes -- 15 -- 15 15   METs -- 2.7 -- 3 3.7     Biostep-RELP   Level -- -- -- 2 2   Minutes -- -- -- 15 15   METs -- -- -- 3 2     Home Exercise Plan   Plans to continue exercise at -- -- Home (comment)  Walking, weights, yoga Home (comment)  Walking, weights, yoga Home (  comment)   Walking, weights, yoga   Frequency -- -- Add 2 additional days to program exercise sessions. Add 2 additional days to program exercise sessions. Add 2 additional days to program exercise sessions.   Initial Home Exercises Provided -- -- 10/11/22 10/11/22 10/11/22     Oxygen   Maintain Oxygen Saturation -- 88% or higher 88% or higher 88% or higher 88% or higher    Row Name 11/20/22 1500 12/08/22 0700           Response to Exercise   Blood Pressure (Admit) 124/62 118/72      Blood Pressure (Exit) 124/62 118/64      Heart Rate (Admit) 72 bpm 86 bpm      Heart Rate (Exercise) 112 bpm 115 bpm      Heart Rate (Exit) 89 bpm 80 bpm      Rating of Perceived Exertion (Exercise) 13 13      Symptoms none none      Duration Continue with 30 min of aerobic exercise without signs/symptoms of physical distress. Continue with 30 min of aerobic exercise without signs/symptoms of physical distress.      Intensity THRR unchanged THRR unchanged        Progression   Progression Continue to progress workloads to maintain intensity without signs/symptoms of physical distress. Continue to progress workloads to maintain intensity without signs/symptoms of physical distress.      Average METs 3.3 3.09        Resistance Training   Training Prescription Yes Yes      Weight 3 lb 3 lb      Reps 10-15 10-15        Interval Training   Interval Training No No        Treadmill   MPH 2.3 2.3      Grade 1 2      Minutes 15 15      METs 3.08 3.39        Recumbant Bike   Level 3.2 3.6      Watts 20 20      Minutes 15 15      METs 3.4 3.16        NuStep   Level 5 5      Minutes 15 15      METs 3.9 3.4        REL-XR   Level -- 2      Minutes -- 15        Home Exercise Plan   Plans to continue exercise at Home (comment)  Walking, weights, yoga Home (comment)  Walking, weights, yoga      Frequency Add 2 additional days to program exercise sessions. Add 2 additional days to program exercise sessions.       Initial Home Exercises Provided 10/11/22 10/11/22        Oxygen   Maintain Oxygen Saturation 88% or higher 88% or higher               Exercise Comments:   Exercise Comments     Row Name 09/25/22 1600           Exercise Comments First full day of exercise!  Patient was oriented to gym and equipment including functions, settings, policies, and procedures.  Patient's individual exercise prescription and treatment plan were reviewed.  All starting workloads were established based on the results of the 6 minute walk test done at initial orientation visit.  The plan for exercise progression was  also introduced and progression will be customized based on patient's performance and goals.                Exercise Comments     Row Name 09/25/22 1600           Exercise Comments First full day of exercise!  Patient was oriented to gym and equipment including functions, settings, policies, and procedures.  Patient's individual exercise prescription and treatment plan were reviewed.  All starting workloads were established based on the results of the 6 minute walk test done at initial orientation visit.  The plan for exercise progression was also introduced and progression will be customized based on patient's performance and goals.                Exercise Goals and Review:   Exercise Goals     Row Name 09/18/22 1655             Exercise Goals   Increase Physical Activity Yes       Intervention Provide advice, education, support and counseling about physical activity/exercise needs.;Develop an individualized exercise prescription for aerobic and resistive training based on initial evaluation findings, risk stratification, comorbidities and participant's personal goals.       Expected Outcomes Short Term: Attend rehab on a regular basis to increase amount of physical activity.;Long Term: Add in home exercise to make exercise part of routine and to increase amount of  physical activity.;Long Term: Exercising regularly at least 3-5 days a week.       Increase Strength and Stamina Yes       Intervention Provide advice, education, support and counseling about physical activity/exercise needs.;Develop an individualized exercise prescription for aerobic and resistive training based on initial evaluation findings, risk stratification, comorbidities and participant's personal goals.       Expected Outcomes Short Term: Increase workloads from initial exercise prescription for resistance, speed, and METs.;Short Term: Perform resistance training exercises routinely during rehab and add in resistance training at home;Long Term: Improve cardiorespiratory fitness, muscular endurance and strength as measured by increased METs and functional capacity ( )       Able to understand and use rate of perceived exertion (RPE) scale Yes       Intervention Provide education and explanation on how to use RPE scale       Expected Outcomes Short Term: Able to use RPE daily in rehab to express subjective intensity level;Long Term:  Able to use RPE to guide intensity level when exercising independently       Able to understand and use Dyspnea scale Yes       Intervention Provide education and explanation on how to use Dyspnea scale       Expected Outcomes Short Term: Able to use Dyspnea scale daily in rehab to express subjective sense of shortness of breath during exertion;Long Term: Able to use Dyspnea scale to guide intensity level when exercising independently       Knowledge and understanding of Target Heart Rate Range (THRR) Yes       Intervention Provide education and explanation of THRR including how the numbers were predicted and where they are located for reference       Expected Outcomes Short Term: Able to state/look up THRR;Long Term: Able to use THRR to govern intensity when exercising independently;Short Term: Able to use daily as guideline for intensity in rehab       Able to  check pulse independently Yes  Intervention Provide education and demonstration on how to check pulse in carotid and radial arteries.;Review the importance of being able to check your own pulse for safety during independent exercise       Expected Outcomes Short Term: Able to explain why pulse checking is important during independent exercise       Understanding of Exercise Prescription Yes       Intervention Provide education, explanation, and written materials on patient's individual exercise prescription       Expected Outcomes Short Term: Able to explain program exercise prescription;Long Term: Able to explain home exercise prescription to exercise independently                Exercise Goals     Row Name 09/18/22 1655             Exercise Goals   Increase Physical Activity Yes       Intervention Provide advice, education, support and counseling about physical activity/exercise needs.;Develop an individualized exercise prescription for aerobic and resistive training based on initial evaluation findings, risk stratification, comorbidities and participant's personal goals.       Expected Outcomes Short Term: Attend rehab on a regular basis to increase amount of physical activity.;Long Term: Add in home exercise to make exercise part of routine and to increase amount of physical activity.;Long Term: Exercising regularly at least 3-5 days a week.       Increase Strength and Stamina Yes       Intervention Provide advice, education, support and counseling about physical activity/exercise needs.;Develop an individualized exercise prescription for aerobic and resistive training based on initial evaluation findings, risk stratification, comorbidities and participant's personal goals.       Expected Outcomes Short Term: Increase workloads from initial exercise prescription for resistance, speed, and METs.;Short Term: Perform resistance training exercises routinely during rehab and add in  resistance training at home;Long Term: Improve cardiorespiratory fitness, muscular endurance and strength as measured by increased METs and functional capacity ( )       Able to understand and use rate of perceived exertion (RPE) scale Yes       Intervention Provide education and explanation on how to use RPE scale       Expected Outcomes Short Term: Able to use RPE daily in rehab to express subjective intensity level;Long Term:  Able to use RPE to guide intensity level when exercising independently       Able to understand and use Dyspnea scale Yes       Intervention Provide education and explanation on how to use Dyspnea scale       Expected Outcomes Short Term: Able to use Dyspnea scale daily in rehab to express subjective sense of shortness of breath during exertion;Long Term: Able to use Dyspnea scale to guide intensity level when exercising independently       Knowledge and understanding of Target Heart Rate Range (THRR) Yes       Intervention Provide education and explanation of THRR including how the numbers were predicted and where they are located for reference       Expected Outcomes Short Term: Able to state/look up THRR;Long Term: Able to use THRR to govern intensity when exercising independently;Short Term: Able to use daily as guideline for intensity in rehab       Able to check pulse independently Yes       Intervention Provide education and demonstration on how to check pulse in carotid and radial arteries.;Review the importance of being able  to check your own pulse for safety during independent exercise       Expected Outcomes Short Term: Able to explain why pulse checking is important during independent exercise       Understanding of Exercise Prescription Yes       Intervention Provide education, explanation, and written materials on patient's individual exercise prescription       Expected Outcomes Short Term: Able to explain program exercise prescription;Long Term: Able to  explain home exercise prescription to exercise independently                Exercise Goals Re-Evaluation :  Exercise Goals Re-Evaluation     Row Name 09/25/22 1601 10/09/22 1545 10/09/22 1555 10/11/22 1609 10/24/22 0738     Exercise Goal Re-Evaluation   Exercise Goals Review Increase Physical Activity;Knowledge and understanding of Target Heart Rate Range (THRR);Able to understand and use rate of perceived exertion (RPE) scale;Understanding of Exercise Prescription;Increase Strength and Stamina;Able to check pulse independently Increase Physical Activity;Increase Strength and Stamina;Understanding of Exercise Prescription Increase Physical Activity;Increase Strength and Stamina;Understanding of Exercise Prescription Able to understand and use Dyspnea scale;Understanding of Exercise Prescription;Knowledge and understanding of Target Heart Rate Range (THRR);Able to check pulse independently;Able to understand and use rate of perceived exertion (RPE) scale;Increase Strength and Stamina;Increase Physical Activity Increase Physical Activity;Increase Strength and Stamina;Understanding of Exercise Prescription   Comments Reviewed RPE scale, THR and program prescription with pt today.  Pt voiced understanding and was given a copy of goals to take home. Stacey Francis is off to a good start in rehab.  She is doing some yoga at home on her off days.  She also does a little resistance training and walking as well. We will review home exercise guidelines with her once she has been coming consistently for a bit.  She was encouraged to maintain what she was currently doing. Stacey Francis has done well with her first couple of sessions of rehab. She has started off with her initial exercise prescription with appropriate RPEs. She did hit her THR the 2 sessions she was here and we hope to see that kept up over her time here at rehab. Will continue to monitor as she progresses. Reviewed home exercise with pt today.  Pt plans to  walk at home, use weights, and do yoga for exercise.  Reviewed THR, pulse, RPE, sign and symptoms, pulse oximetery and when to call 911 or MD.  Also discussed weather considerations and indoor options.  Pt voiced understanding. Stacey Francis is doing well in rehab. She recently increased her overall average MET level to 2.78 METs. She also walked the treadmill once since the last review at a speed of 1.5 mph and an incline of 0.5%. She may benefit from walking in the program more consistently. She also has kept her workloads consistent on the recumbent bike, T4 nustep, and biostep. We will continue to monitor her progress in the program.   Expected Outcomes Short: Use RPE daily to regulate intensity.  Long: Follow program prescription in THR. Short: Review home exercise Long: Conitnue to attend rehab to build stamina Short: Continue initial exercise prescription Long: Improve overall strength and stamina Short: Walk at home on days away from rehab. Long: Continue to improve strength and stamina. Short: Walk more consistently in rehab, progressively increase workloads. Long: Continue to increase overall MET level and stamina.    Row Name 11/02/22 1603 11/07/22 0815 11/20/22 1520 12/08/22 0718       Exercise Goal Re-Evaluation   Exercise  Goals Review Increase Physical Activity;Increase Strength and Stamina;Understanding of Exercise Prescription Increase Physical Activity;Increase Strength and Stamina;Understanding of Exercise Prescription Increase Physical Activity;Increase Strength and Stamina;Understanding of Exercise Prescription Increase Physical Activity;Increase Strength and Stamina;Understanding of Exercise Prescription    Comments Stacey Francis is doing well with exercise. She feels her stamina has improved as she is able to exercise for longer bouts. She reports that she tries to push herself more while she is at rehab because she knows she is being monitored. She also states that she has been walking at home on  days that she doesn't come to rehab. She has kettlebell weights that she uses for resistance training at home as well. Stacey Francis is doing well in rehab.  She is up to level 5 on the NuStep.  We will encourage her to continue to build up workloads and weights.  We will conitnue to monitor her progress. Stacey Francis is doing well in the program. She recently increased her average overall MET level to 3.3 METs. She also increased her treadmill workload to a speed of 2.3 mph and an incline of 1%. She improved to level 3.2 on the recumbent bike as well. We will continue to monitor her progress in the program. Stacey Francis continues to do well in the program and is close to graduating. She continues to walk at a speed of 2.3 mph on the treadmill with an increased incline of 2%. She also completed her post and improved by 22%! We will continue to monitor her progress in the program.    Expected Outcomes Short: Continue to walk on days away from rehab. Long: Continue to improve strength and stamina. Short: Increase hand weights Long: Continue to improve stamina Short: Continue to progressively increase treadmill workload. Long: Continue to improve strength and stamina. Short: Graduate. Long: Continue to exercise independently.             Exercise Goals Re-Evaluation     Row Name 09/25/22 1601 10/09/22 1545 10/09/22 1555 10/11/22 1609 10/24/22 0738     Exercise Goal Re-Evaluation   Exercise Goals Review Increase Physical Activity;Knowledge and understanding of Target Heart Rate Range (THRR);Able to understand and use rate of perceived exertion (RPE) scale;Understanding of Exercise Prescription;Increase Strength and Stamina;Able to check pulse independently Increase Physical Activity;Increase Strength and Stamina;Understanding of Exercise Prescription Increase Physical Activity;Increase Strength and Stamina;Understanding of Exercise Prescription Able to understand and use Dyspnea scale;Understanding of Exercise  Prescription;Knowledge and understanding of Target Heart Rate Range (THRR);Able to check pulse independently;Able to understand and use rate of perceived exertion (RPE) scale;Increase Strength and Stamina;Increase Physical Activity Increase Physical Activity;Increase Strength and Stamina;Understanding of Exercise Prescription   Comments Reviewed RPE scale, THR and program prescription with pt today.  Pt voiced understanding and was given a copy of goals to take home. Hadlea is off to a good start in rehab.  She is doing some yoga at home on her off days.  She also does a little resistance training and walking as well. We will review home exercise guidelines with her once she has been coming consistently for a bit.  She was encouraged to maintain what she was currently doing. Stacey Francis has done well with her first couple of sessions of rehab. She has started off with her initial exercise prescription with appropriate RPEs. She did hit her THR the 2 sessions she was here and we hope to see that kept up over her time here at rehab. Will continue to monitor as she progresses. Reviewed home exercise with  pt today.  Pt plans to walk at home, use weights, and do yoga for exercise.  Reviewed THR, pulse, RPE, sign and symptoms, pulse oximetery and when to call 911 or MD.  Also discussed weather considerations and indoor options.  Pt voiced understanding. Stacey Francis is doing well in rehab. She recently increased her overall average MET level to 2.78 METs. She also walked the treadmill once since the last review at a speed of 1.5 mph and an incline of 0.5%. She may benefit from walking in the program more consistently. She also has kept her workloads consistent on the recumbent bike, T4 nustep, and biostep. We will continue to monitor her progress in the program.   Expected Outcomes Short: Use RPE daily to regulate intensity.  Long: Follow program prescription in THR. Short: Review home exercise Long: Conitnue to attend rehab to  build stamina Short: Continue initial exercise prescription Long: Improve overall strength and stamina Short: Walk at home on days away from rehab. Long: Continue to improve strength and stamina. Short: Walk more consistently in rehab, progressively increase workloads. Long: Continue to increase overall MET level and stamina.    Row Name 11/02/22 1603 11/07/22 0815 11/20/22 1520 12/08/22 0718       Exercise Goal Re-Evaluation   Exercise Goals Review Increase Physical Activity;Increase Strength and Stamina;Understanding of Exercise Prescription Increase Physical Activity;Increase Strength and Stamina;Understanding of Exercise Prescription Increase Physical Activity;Increase Strength and Stamina;Understanding of Exercise Prescription Increase Physical Activity;Increase Strength and Stamina;Understanding of Exercise Prescription    Comments Stacey Francis is doing well with exercise. She feels her stamina has improved as she is able to exercise for longer bouts. She reports that she tries to push herself more while she is at rehab because she knows she is being monitored. She also states that she has been walking at home on days that she doesn't come to rehab. She has kettlebell weights that she uses for resistance training at home as well. Stacey Francis is doing well in rehab.  She is up to level 5 on the NuStep.  We will encourage her to continue to build up workloads and weights.  We will conitnue to monitor her progress. Stacey Francis is doing well in the program. She recently increased her average overall MET level to 3.3 METs. She also increased her treadmill workload to a speed of 2.3 mph and an incline of 1%. She improved to level 3.2 on the recumbent bike as well. We will continue to monitor her progress in the program. Stacey Francis continues to do well in the program and is close to graduating. She continues to walk at a speed of 2.3 mph on the treadmill with an increased incline of 2%. She also completed her post and  improved by 22%! We will continue to monitor her progress in the program.    Expected Outcomes Short: Continue to walk on days away from rehab. Long: Continue to improve strength and stamina. Short: Increase hand weights Long: Continue to improve stamina Short: Continue to progressively increase treadmill workload. Long: Continue to improve strength and stamina. Short: Graduate. Long: Continue to exercise independently.             Exercise Goals Re-Evaluation     Row Name 09/25/22 1601 10/09/22 1545 10/09/22 1555 10/11/22 1609 10/24/22 0738     Exercise Goal Re-Evaluation   Exercise Goals Review Increase Physical Activity;Knowledge and understanding of Target Heart Rate Range (THRR);Able to understand and use rate of perceived exertion (RPE) scale;Understanding of Exercise Prescription;Increase Strength  and Stamina;Able to check pulse independently Increase Physical Activity;Increase Strength and Stamina;Understanding of Exercise Prescription Increase Physical Activity;Increase Strength and Stamina;Understanding of Exercise Prescription Able to understand and use Dyspnea scale;Understanding of Exercise Prescription;Knowledge and understanding of Target Heart Rate Range (THRR);Able to check pulse independently;Able to understand and use rate of perceived exertion (RPE) scale;Increase Strength and Stamina;Increase Physical Activity Increase Physical Activity;Increase Strength and Stamina;Understanding of Exercise Prescription   Comments Reviewed RPE scale, THR and program prescription with pt today.  Pt voiced understanding and was given a copy of goals to take home. Stacey Francis is off to a good start in rehab.  She is doing some yoga at home on her off days.  She also does a little resistance training and walking as well. We will review home exercise guidelines with her once she has been coming consistently for a bit.  She was encouraged to maintain what she was currently doing. Stacey Francis has done well with  her first couple of sessions of rehab. She has started off with her initial exercise prescription with appropriate RPEs. She did hit her THR the 2 sessions she was here and we hope to see that kept up over her time here at rehab. Will continue to monitor as she progresses. Reviewed home exercise with pt today.  Pt plans to walk at home, use weights, and do yoga for exercise.  Reviewed THR, pulse, RPE, sign and symptoms, pulse oximetery and when to call 911 or MD.  Also discussed weather considerations and indoor options.  Pt voiced understanding. Stacey Francis is doing well in rehab. She recently increased her overall average MET level to 2.78 METs. She also walked the treadmill once since the last review at a speed of 1.5 mph and an incline of 0.5%. She may benefit from walking in the program more consistently. She also has kept her workloads consistent on the recumbent bike, T4 nustep, and biostep. We will continue to monitor her progress in the program.   Expected Outcomes Short: Use RPE daily to regulate intensity.  Long: Follow program prescription in THR. Short: Review home exercise Long: Conitnue to attend rehab to build stamina Short: Continue initial exercise prescription Long: Improve overall strength and stamina Short: Walk at home on days away from rehab. Long: Continue to improve strength and stamina. Short: Walk more consistently in rehab, progressively increase workloads. Long: Continue to increase overall MET level and stamina.    Row Name 11/02/22 1603 11/07/22 0815 11/20/22 1520 12/08/22 0718       Exercise Goal Re-Evaluation   Exercise Goals Review Increase Physical Activity;Increase Strength and Stamina;Understanding of Exercise Prescription Increase Physical Activity;Increase Strength and Stamina;Understanding of Exercise Prescription Increase Physical Activity;Increase Strength and Stamina;Understanding of Exercise Prescription Increase Physical Activity;Increase Strength and  Stamina;Understanding of Exercise Prescription    Comments Stacey Francis is doing well with exercise. She feels her stamina has improved as she is able to exercise for longer bouts. She reports that she tries to push herself more while she is at rehab because she knows she is being monitored. She also states that she has been walking at home on days that she doesn't come to rehab. She has kettlebell weights that she uses for resistance training at home as well. Stacey Francis is doing well in rehab.  She is up to level 5 on the NuStep.  We will encourage her to continue to build up workloads and weights.  We will conitnue to monitor her progress. Stacey Francis is doing well in the program. She recently  increased her average overall MET level to 3.3 METs. She also increased her treadmill workload to a speed of 2.3 mph and an incline of 1%. She improved to level 3.2 on the recumbent bike as well. We will continue to monitor her progress in the program. Stacey Francis continues to do well in the program and is close to graduating. She continues to walk at a speed of 2.3 mph on the treadmill with an increased incline of 2%. She also completed her post and improved by 22%! We will continue to monitor her progress in the program.    Expected Outcomes Short: Continue to walk on days away from rehab. Long: Continue to improve strength and stamina. Short: Increase hand weights Long: Continue to improve stamina Short: Continue to progressively increase treadmill workload. Long: Continue to improve strength and stamina. Short: Graduate. Long: Continue to exercise independently.             Discharge Exercise Prescription (Final Exercise Prescription Changes):  Exercise Prescription Changes - 12/08/22 0700       Response to Exercise   Blood Pressure (Admit) 118/72    Blood Pressure (Exit) 118/64    Heart Rate (Admit) 86 bpm    Heart Rate (Exercise) 115 bpm    Heart Rate (Exit) 80 bpm    Rating of Perceived Exertion (Exercise) 13     Symptoms none    Duration Continue with 30 min of aerobic exercise without signs/symptoms of physical distress.    Intensity THRR unchanged      Progression   Progression Continue to progress workloads to maintain intensity without signs/symptoms of physical distress.    Average METs 3.09      Resistance Training   Training Prescription Yes    Weight 3 lb    Reps 10-15      Interval Training   Interval Training No      Treadmill   MPH 2.3    Grade 2    Minutes 15    METs 3.39      Recumbant Bike   Level 3.6    Watts 20    Minutes 15    METs 3.16      NuStep   Level 5    Minutes 15    METs 3.4      REL-XR   Level 2    Minutes 15      Home Exercise Plan   Plans to continue exercise at Home (comment)   Walking, weights, yoga   Frequency Add 2 additional days to program exercise sessions.    Initial Home Exercises Provided 10/11/22      Oxygen   Maintain Oxygen Saturation 88% or higher             Nutrition:  Target Goals: Understanding of nutrition guidelines, daily intake of sodium 1500mg , cholesterol 200mg , calories 30% from fat and 7% or less from saturated fats, daily to have 5 or more servings of fruits and vegetables.  Education: All About Nutrition: -Group instruction provided by verbal, written material, interactive activities, discussions, models, and posters to present general guidelines for heart healthy nutrition including fat, fiber, MyPlate, the role of sodium in heart healthy nutrition, utilization of the nutrition label, and utilization of this knowledge for meal planning. Follow up email sent as well. Written material given at graduation. Flowsheet Row Cardiac Rehab from 12/06/2022 in [facility] Cardiac [facility] Pul[facility]  Education need identified 09/18/22       Biometrics:  Pre Biometrics -  12/04/22 1556       Pre Biometrics   Height 5' 3.5" (1.613 m)    Weight 118 lb 4.8 oz (53.7 kg)    Waist Circumference 28 inches    Hip  Circumference 36.5 inches    Waist to Hip Ratio 0.77 %    BMI (Calculated) 20.62    Single Leg Stand 30 seconds              Nutrition Therapy Plan and Nutrition Goals:  Nutrition Therapy & Goals - 09/18/22 1657       Intervention Plan   Intervention Prescribe, educate and counsel regarding individualized specific dietary modifications aiming towards targeted core components such as weight, hypertension, lipid management, diabetes, heart failure and other comorbidities.    Expected Outcomes Short Term Goal: Understand basic principles of dietary content, such as calories, fat, sodium, cholesterol and nutrients.;Short Term Goal: A plan has been developed with personal nutrition goals set during dietitian appointment.;Long Term Goal: Adherence to prescribed nutrition plan.             Nutrition Assessments:  MEDIFICTS Score Key: ?70 Need to make dietary changes  40-70 Heart Healthy Diet ? 40 Therapeutic Level Cholesterol Diet  Flowsheet Row Cardiac Rehab from 12/25/2022 in [facility] Cardiac and Pulmonary Rehab  Picture Your Plate Total Score on Discharge 73      Picture Your Plate Scores: <69 Unhealthy dietary pattern with much room for improvement. 41-50 Dietary pattern unlikely to meet recommendations for good health and room for improvement. 51-60 More healthful dietary pattern, with some room for improvement.  >60 Healthy dietary pattern, although there may be some specific behaviors that could be improved.    Nutrition Goals Re-Evaluation:  Nutrition Goals Re-Evaluation     Row Name 10/09/22 1551 11/02/22 1623           Goals   Comment Gordana is off to a good start in rehab  She was not able to meet with dietitian before they left but would like once new one starts.  She feels that she is already doing well on her heart healthy diet.  She avoids processed foods and usually sticks to organics. She eats lots of fruits and vegetables.  She also has cut way back on  sugar but will treat herself on occassion. Stacey Francis was not able to speak with the RD before she left. However, she states that she is interested in speaking with the new RD about some heart healthy eating patterns once he starts.      Expected Outcome Short: Continue with diet Long: Continue to avoid sugar Short: Speak with new RD. Long: Continue to practice heart healthy eating patterns.               Nutrition Goals Re-Evaluation     Row Name 10/09/22 1551 11/02/22 1623           Goals   Comment Stacey Francis is off to a good start in rehab  She was not able to meet with dietitian before they left but would like once new one starts.  She feels that she is already doing well on her heart healthy diet.  She avoids processed foods and usually sticks to organics. She eats lots of fruits and vegetables.  She also has cut way back on sugar but will treat herself on occassion. Stacey Francis was not able to speak with the RD before she left. However, she states that she is interested in speaking with the new RD  about some heart healthy eating patterns once he starts.      Expected Outcome Short: Continue with diet Long: Continue to avoid sugar Short: Speak with new RD. Long: Continue to practice heart healthy eating patterns.               Nutrition Goals Discharge (Final Nutrition Goals Re-Evaluation):  Nutrition Goals Re-Evaluation - 11/02/22 1623       Goals   Comment Stacey Francis was not able to speak with the RD before she left. However, she states that she is interested in speaking with the new RD about some heart healthy eating patterns once he starts.    Expected Outcome Short: Speak with new RD. Long: Continue to practice heart healthy eating patterns.             Psychosocial: Target Goals: Acknowledge presence or absence of significant depression and/or stress, maximize coping skills, provide positive support system. Participant is able to verbalize types and ability to use techniques and  skills needed for reducing stress and depression.   Education: Stress, Anxiety, and Depression - Group verbal and visual presentation to define topics covered.  Reviews how body is impacted by stress, anxiety, and depression.  Also discusses healthy ways to reduce stress and to treat/manage anxiety and depression.  Written material given at graduation. Flowsheet Row Cardiac Rehab from 12/06/2022 in [facility] Cardiac and Pulmonary Rehab  Date 10/25/22  Educator KW  Instruction Review Code 1- [employer] Understanding       Education: Sleep Hygiene -Provides group verbal and written instruction about how sleep can affect your health.  Define sleep hygiene, discuss sleep cycles and impact of sleep habits. Review good sleep hygiene tips.    Initial Review & Psychosocial Screening:  Initial Psych Review & Screening - 09/11/22 1025       Initial Review   Current issues with Current Stress Concerns    Source of Stress Concerns Chronic Illness    Comments She has been more frustrated with her limitations and her health. She is not able to do the things that she wants to do. For the past two years she has been trying to find out what is wrong with her and did not find it until now. Her doctor told her she still has a blockage and cant to anything about it now.      Family Dynamics   Good Support System? Yes    Comments Hargun can look to her four kids for support and are helpful.             Quality of Life Scores:   Quality of Life - 12/25/22 1600       Quality of Life   Select Quality of Life      Quality of Life Scores   Health/Function Pre 14.07 %    Health/Function Post 22.73 %    Health/Function % Change 61.55 %    Socioeconomic Pre 26.08 %    Socioeconomic Post 26.5 %    Socioeconomic % Change  1.61 %    Psych/Spiritual Pre 25 %    Psych/Spiritual Post 25.08 %    Psych/Spiritual % Change 0.32 %    Family Pre 28.5 %    Family Post 30 %    Family % Change 5.26 %    GLOBAL  Pre 20.58 %    GLOBAL Post 24.82 %    GLOBAL % Change 20.6 %  Scores of 19 and below usually indicate a poorer quality of life in these areas.  A difference of  2-3 points is a clinically meaningful difference.  A difference of 2-3 points in the total score of the Quality of Life Index has been associated with significant improvement in overall quality of life, self-image, physical symptoms, and general health in studies assessing change in quality of life.  PHQ-9: Review Flowsheet       12/25/2022 09/18/2022  Depression screen PHQ 2/9  Decreased Interest 0 0  Down, Depressed, Hopeless 0 0  PHQ - 2 Score 0 0  Altered sleeping 0 0  Tired, decreased energy 1 1  Change in appetite 0 0  Feeling bad or failure about yourself  0 0  Trouble concentrating 0 0  Moving slowly or fidgety/restless 0 0  Suicidal thoughts 0 0  PHQ-9 Score 1 1  Difficult doing work/chores Somewhat difficult Not difficult at all    Details           Interpretation of Total Score  Total Score Depression Severity:  1-4 = Minimal depression, 5-9 = Mild depression, 10-14 = Moderate depression, 15-19 = Moderately severe depression, 20-27 = Severe depression   Psychosocial Evaluation and Intervention:  Psychosocial Evaluation - 09/11/22 1032       Psychosocial Evaluation & Interventions   Interventions Encouraged to exercise with the program and follow exercise prescription;Relaxation education;Stress management education    Comments She has been more frustrated with her limitations and her health. She is not able to do the things that she wants to do. For the past two years she has been trying to find out what is wrong with her and did not find it until now. Her doctor told her she still has a blockage and cant to anything about it now.Felicity can look to her four kids for support and are helpful.    Expected Outcomes Short: Start [facility] to help with mood. Long: Maintain a healthy mental state     Continue Psychosocial Services  Follow up required by staff             Psychosocial Re-Evaluation:  Psychosocial Re-Evaluation     Row Name 10/09/22 1548 11/02/22 1607           Psychosocial Re-Evaluation   Current issues with Current Stress Concerns Current Stress Concerns      Comments Yanin is doing well in rehab so far.  She is doing well mentally for most part.  She has days that she gets really frustrated with her fatigue levels and not being able to go and do like she used to.  She wants to continue to build back up to get back to being active again. She is sleeping pretty good most of the time.  She will wake about once a night, but usually able to get back to sleep easily. She is still concerned about her LAD not being fully functional, and has just had more testing with it. Neeti is doing well mentally. She states that she has not been dealing with any major stressors at this time. She has just been frustrated with her cardiac health concerns. She states that she enjoys gardening and being in nature with her dogs for stress relief. She reports that her 4 children, friends, and her dogs are a good support for her. She reports that she is sleeping well, although she does wake up in the middle of the night occasionally.  Expected Outcomes Short: Follow up on artery workup and exercise for mental boost Long: Continue to build back up to being able to go and do Short: Continue to find healthy ways to relieve stress. Long: Continue to maintain positive outlook.      Interventions Encouraged to attend Cardiac Rehabilitation for the exercise Encouraged to attend Cardiac Rehabilitation for the exercise      Continue Psychosocial Services  Follow up required by staff Follow up required by staff        Initial Review   Source of Stress Concerns Unable to participate in former interests or hobbies;Unable to perform yard/household activities Chronic Illness                Psychosocial Re-Evaluation     Row Name 10/09/22 1548 11/02/22 1607           Psychosocial Re-Evaluation   Current issues with Current Stress Concerns Current Stress Concerns      Comments Stacey Francis is doing well in rehab so far.  She is doing well mentally for most part.  She has days that she gets really frustrated with her fatigue levels and not being able to go and do like she used to.  She wants to continue to build back up to get back to being active again. She is sleeping pretty good most of the time.  She will wake about once a night, but usually able to get back to sleep easily. She is still concerned about her LAD not being fully functional, and has just had more testing with it. Lannah is doing well mentally. She states that she has not been dealing with any major stressors at this time. She has just been frustrated with her cardiac health concerns. She states that she enjoys gardening and being in nature with her dogs for stress relief. She reports that her 4 children, friends, and her dogs are a good support for her. She reports that she is sleeping well, although she does wake up in the middle of the night occasionally.      Expected Outcomes Short: Follow up on artery workup and exercise for mental boost Long: Continue to build back up to being able to go and do Short: Continue to find healthy ways to relieve stress. Long: Continue to maintain positive outlook.      Interventions Encouraged to attend Cardiac Rehabilitation for the exercise Encouraged to attend Cardiac Rehabilitation for the exercise      Continue Psychosocial Services  Follow up required by staff Follow up required by staff        Initial Review   Source of Stress Concerns Unable to participate in former interests or hobbies;Unable to perform yard/household activities Chronic Illness               Psychosocial Discharge (Final Psychosocial Re-Evaluation):  Psychosocial Re-Evaluation - 11/02/22 1607        Psychosocial Re-Evaluation   Current issues with Current Stress Concerns    Comments Stacey Francis is doing well mentally. She states that she has not been dealing with any major stressors at this time. She has just been frustrated with her cardiac health concerns. She states that she enjoys gardening and being in nature with her dogs for stress relief. She reports that her 4 children, friends, and her dogs are a good support for her. She reports that she is sleeping well, although she does wake up in the middle of the night occasionally.    Expected Outcomes Short:  Continue to find healthy ways to relieve stress. Long: Continue to maintain positive outlook.    Interventions Encouraged to attend Cardiac Rehabilitation for the exercise    Continue Psychosocial Services  Follow up required by staff      Initial Review   Source of Stress Concerns Chronic Illness             Vocational Rehabilitation: Provide vocational rehab assistance to qualifying candidates.   Vocational Rehab Evaluation & Intervention:   Education: Education Goals: Education classes will be provided on a variety of topics geared toward better understanding of heart health and risk factor modification. Participant will state understanding/return demonstration of topics presented as noted by education test scores.  Learning Barriers/Preferences:  Learning Barriers/Preferences - 09/11/22 1024       Learning Barriers/Preferences   Learning Barriers None    Learning Preferences None             General Cardiac Education Topics:  AED/CPR: - Group verbal and written instruction with the use of models to demonstrate the basic use of the AED with the basic ABC's of resuscitation.   Anatomy and Cardiac Procedures: - Group verbal and visual presentation and models provide information about basic cardiac anatomy and function. Reviews the testing methods done to diagnose heart disease and the outcomes of the test results.  Describes the treatment choices: Medical Management, Angioplasty, or Coronary Bypass Surgery for treating various heart conditions including Myocardial Infarction, Angina, Valve Disease, and Cardiac Arrhythmias.  Written material given at graduation. Flowsheet Row Cardiac Rehab from 12/06/2022 in [facility] Cardiac and Pulmonary Rehab  Date 12/06/22  Educator MS  Instruction Review Code 1- Verbalizes Understanding       Medication Safety: - Group verbal and visual instruction to review commonly prescribed medications for heart and lung disease. Reviews the medication, class of the drug, and side effects. Includes the steps to properly store meds and maintain the prescription regimen.  Written material given at graduation. Flowsheet Row Cardiac Rehab from 12/06/2022 in [facility] Cardiac and Pulmonary Rehab  Date 10/04/22  Educator SB  Instruction Review Code 1- Verbalizes Understanding       Intimacy: - Group verbal instruction through game format to discuss how heart and lung disease can affect sexual intimacy. Written material given at graduation.. Flowsheet Row Cardiac Rehab from 12/06/2022 in [facility] Cardiac and Pulmonary Rehab  Date 11/08/22  Educator [facility]  Instruction Review Code 1- Verbalizes Understanding       Know Your Numbers and Heart Failure: - Group verbal and visual instruction to discuss disease risk factors for cardiac and pulmonary disease and treatment options.  Reviews associated critical values for Overweight/Obesity, Hypertension, Cholesterol, and Diabetes.  Discusses basics of heart failure: signs/symptoms and treatments.  Introduces Heart Failure Zone chart for action plan for heart failure.  Written material given at graduation. Flowsheet Row Cardiac Rehab from 12/06/2022 in [facility] Cardiac and Pulmonary Rehab  Date 10/11/22  Educator MS  Instruction Review Code 1- Verbalizes Understanding       Infection Prevention: - Provides verbal and written material to individual with  discussion of infection control including proper hand washing and proper equipment cleaning during exercise session. Flowsheet Row Cardiac Rehab from 12/06/2022 in [facility] Cardiac and Pulmonary Rehab  Date 09/18/22  Educator [facility]  Instruction Review Code 1- Verbalizes Understanding       Falls Prevention: - Provides verbal and written material to individual with discussion of falls prevention and safety. Flowsheet Row Cardiac Rehab from 12/06/2022  in [facility] Cardiac and Pulmonary Rehab  Date 09/18/22  Educator [facility]  Instruction Review Code 1- Verbalizes Understanding       Other: -Provides group and verbal instruction on various topics (see comments) Flowsheet Row Cardiac Rehab from 12/06/2022 in [facility] Cardiac and Pulmonary Rehab  Date 11/15/22  [11/29/22- [employer]opard[employer]]  Educator Relaxation- [facility]  Instruction Review Code 1- Verbalizes Understanding       Knowledge Questionnaire Score:  Knowledge Questionnaire Score - 12/25/22 1600       Knowledge Questionnaire Score   Post Score 24/26             Core Components/Risk Factors/Patient Goals at Admission:  Personal Goals and Risk Factors at Admission - 09/18/22 1657       Core Components/Risk Factors/Patient Goals on Admission    Weight Management Yes;Weight Gain    Intervention Weight Management: Develop a combined nutrition and exercise program designed to reach desired caloric intake, while maintaining appropriate intake of nutrient and fiber, sodium and fats, and appropriate energy expenditure required for the weight goal.;Weight Management: Provide education and appropriate resources to help participant work on and attain dietary goals.;Weight Management/Obesity: Establish reasonable short term and long term weight goals.    Admit Weight 116 lb 11.2 oz (52.9 kg)    Goal Weight: Short Term 120 lb (54.4 kg)    Goal Weight: Long Term 120 lb (54.4 kg)    Expected Outcomes Short Term: Continue to assess and modify interventions until short  term weight is achieved;Long Term: Adherence to nutrition and physical activity/exercise program aimed toward attainment of established weight goal;Weight Maintenance: Understanding of the daily nutrition guidelines, which includes 25-35% calories from fat, 7% or less cal from saturated fats, less than 200mg  cholesterol, less than 1.5gm of sodium, & 5 or more servings of fruits and vegetables daily;Understanding recommendations for meals to include 15-35% energy as protein, 25-35% energy from fat, 35-60% energy from carbohydrates, less than 200mg  of dietary cholesterol, 20-35 gm of total fiber daily;Understanding of distribution of calorie intake throughout the day with the consumption of 4-5 meals/snacks;Weight Gain: Understanding of general recommendations for a high calorie, high protein meal plan that promotes weight gain by distributing calorie intake throughout the day with the consumption for 4-5 meals, snacks, and/or supplements    Hypertension Yes    Intervention Provide education on lifestyle modifcations including regular physical activity/exercise, weight management, moderate sodium restriction and increased consumption of fresh fruit, vegetables, and low fat dairy, alcohol moderation, and smoking cessation.;Monitor prescription use compliance.    Expected Outcomes Short Term: Continued assessment and intervention until BP is < 140/30mm HG in hypertensive participants. < 130/63mm HG in hypertensive participants with diabetes, heart failure or chronic kidney disease.;Long Term: Maintenance of blood pressure at goal levels.    Lipids Yes    Intervention Provide education and support for participant on nutrition & aerobic/resistive exercise along with prescribed medications to achieve LDL 70mg , HDL >40mg .    Expected Outcomes Short Term: Participant states understanding of desired cholesterol values and is compliant with medications prescribed. Participant is following exercise prescription and  nutrition guidelines.;Long Term: Cholesterol controlled with medications as prescribed, with individualized exercise RX and with personalized nutrition plan. Value goals: LDL < 70mg , HDL > 40 mg.             Education:Diabetes - Individual verbal and written instruction to review signs/symptoms of diabetes, desired ranges of glucose level fasting, after meals and with exercise. Acknowledge that pre and post exercise glucose  checks will be done for 3 sessions at entry of program.   Core Components/Risk Factors/Patient Goals Review:   Goals and Risk Factor Review     Row Name 10/09/22 1555 11/02/22 1613           Core Components/Risk Factors/Patient Goals Review   Personal Goals Review Weight Management/Obesity;Hypertension Weight Management/Obesity;Hypertension      Review Lilie is off to a good start in rehab.  Her weight is staying fairly steady between 115-118 lb.  Her blood pressures are doing well overall, on the upper end of normal.  She is checking them at home.  She is usually in the 130s but has gotten up to 180s on occassion.  She was encouraged to bring her cuff into class to check again what we are getting as well. Marca is happy with where her weight is at this time and states that she feels she is at her optimal weight. She would like to maintain her weight right around 120 lbs. She is checking her BP at home and reports that they are staying within normal ranges. She states that her systolic BP has been between 120-135 and her diastolic BP has been between 70-80. Her BP has been within normal ranges in rehab as well.      Expected Outcomes Short: Bring in blood pressure cuff to check Long: conitnue to maintain weight Short: Continue to monitor BP at home. Long: Continue to maintain weight.               Goals and Risk Factor Review     Row Name 10/09/22 1555 11/02/22 1613           Core Components/Risk Factors/Patient Goals Review   Personal Goals Review Weight  Management/Obesity;Hypertension Weight Management/Obesity;Hypertension      Review Etha is off to a good start in rehab.  Her weight is staying fairly steady between 115-118 lb.  Her blood pressures are doing well overall, on the upper end of normal.  She is checking them at home.  She is usually in the 130s but has gotten up to 180s on occassion.  She was encouraged to bring her cuff into class to check again what we are getting as well. Lucina is happy with where her weight is at this time and states that she feels she is at her optimal weight. She would like to maintain her weight right around 120 lbs. She is checking her BP at home and reports that they are staying within normal ranges. She states that her systolic BP has been between 120-135 and her diastolic BP has been between 70-80. Her BP has been within normal ranges in rehab as well.      Expected Outcomes Short: Bring in blood pressure cuff to check Long: conitnue to maintain weight Short: Continue to monitor BP at home. Long: Continue to maintain weight.               Core Components/Risk Factors/Patient Goals at Discharge (Final Review):   Goals and Risk Factor Review - 11/02/22 1613       Core Components/Risk Factors/Patient Goals Review   Personal Goals Review Weight Management/Obesity;Hypertension    Review Oletta is happy with where her weight is at this time and states that she feels she is at her optimal weight. She would like to maintain her weight right around 120 lbs. She is checking her BP at home and reports that they are staying within normal ranges. She states that her  systolic BP has been between 120-135 and her diastolic BP has been between 70-80. Her BP has been within normal ranges in rehab as well.    Expected Outcomes Short: Continue to monitor BP at home. Long: Continue to maintain weight.             ITP Comments:  ITP Comments     Row Name 09/11/22 1024 09/18/22 1650 09/20/22 1424 09/25/22 1600  10/18/22 0945   ITP Comments Virtual Visit completed. Patient informed on EP and RD appointment and 6 Minute walk test. Patient also informed of patient health questionnaires on My Chart. Patient Verbalizes understanding. Visit diagnosis can be found in [facility] 08/13/[facility]22. Completed and gym orientation. Initial ITP created and sent for review to Dr. Bethann Punches, Medical Director. 30 day review completed. ITP sent to Dr. Bethann Punches, Medical Director of Cardiac Rehab. Continue with ITP unless changes are made by physician.   Pt new to program. First full day of exercise!  Patient was oriented to gym and equipment including functions, settings, policies, and procedures.  Patient's individual exercise prescription and treatment plan were reviewed.  All starting workloads were established based on the results of the 6 minute walk test done at initial orientation visit.  The plan for exercise progression was also introduced and progression will be customized based on patient's performance and goals. 30 Day review completed. Medical Director ITP review done, changes made as directed, and signed approval by Medical Director.    new to program    Row Name 11/15/22 1137 12/12/22 1510 12/25/22 1546       ITP Comments 30 Day review completed. Medical Director ITP review done, changes made as directed, and signed approval by Medical Director. 30 Day review completed. Medical Director ITP review done, changes made as directed, and signed approval by Medical Director. Annakate graduated today from  rehab with 36 sessions completed.  Details of the patient's exercise prescription and what She needs to do in order to continue the prescription and progress were discussed with patient.  Patient was given a copy of prescription and goals.  Patient verbalized understanding.  Jennette plans to continue to exercise by walking, yoga, using weights.              ITP Comments     Row Name 09/11/22 1024 09/18/22 1650 09/20/22  1424 09/25/22 1600 10/18/22 0945   ITP Comments Virtual Visit completed. Patient informed on EP and RD appointment and 6 Minute walk test. Patient also informed of patient health questionnaires on My Chart. Patient Verbalizes understanding. Visit diagnosis can be found in [facility] 08/13/2022. Completed and gym orientation. Initial ITP created and sent for review to Dr. Bethann Punches, Medical Director. 30 day review completed. ITP sent to Dr. Bethann Punches, Medical Director of Cardiac Rehab. Continue with ITP unless changes are made by physician.   Pt new to program. First full day of exercise!  Patient was oriented to gym and equipment including functions, settings, policies, and procedures.  Patient's individual exercise prescription and treatment plan were reviewed.  All starting workloads were established based on the results of the 6 minute walk test done at initial orientation visit.  The plan for exercise progression was also introduced and progression will be customized based on patient's performance and goals. 30 Day review completed. Medical Director ITP review done, changes made as directed, and signed approval by Medical Director.    new to program    Row Name 11/15/22 1137 12/12/22  1510 12/25/22 1546       ITP Comments 30 Day review completed. Medical Director ITP review done, changes made as directed, and signed approval by Medical Director. 30 Day review completed. Medical Director ITP review done, changes made as directed, and signed approval by Medical Director. Xandra graduated today from  rehab with 36 sessions completed.  Details of the patient's exercise prescription and what She needs to do in order to continue the prescription and progress were discussed with patient.  Patient was given a copy of prescription and goals.  Patient verbalized understanding.  Diane plans to continue to exercise by walking, yoga, using weights.              ITP Comments     Row Name 09/11/22 1024  09/18/22 1650 09/20/22 1424 09/25/22 1600 10/18/22 0945   ITP Comments Virtual Visit completed. Patient informed on EP and RD appointment and 6 Minute walk test. Patient also informed of patient health questionnaires on My Chart. Patient Verbalizes understanding. Visit diagnosis can be found in [facility] 08/13/2022. Completed and gym orientation. Initial ITP created and sent for review to Dr. Bethann Punches, Medical Director. 30 day review completed. ITP sent to Dr. Bethann Punches, Medical Director of Cardiac Rehab. Continue with ITP unless changes are made by physician.   Pt new to program. First full day of exercise!  Patient was oriented to gym and equipment including functions, settings, policies, and procedures.  Patient's individual exercise prescription and treatment plan were reviewed.  All starting workloads were established based on the results of the 6 minute walk test done at initial orientation visit.  The plan for exercise progression was also introduced and progression will be customized based on patient's performance and goals. 30 Day review completed. Medical Director ITP review done, changes made as directed, and signed approval by Medical Director.    new to program    Row Name 11/15/22 1137 12/12/22 1510 12/25/22 1546       ITP Comments 30 Day review completed. Medical Director ITP review done, changes made as directed, and signed approval by Medical Director. 30 Day review completed. Medical Director ITP review done, changes made as directed, and signed approval by Medical Director. Glenora graduated today from  rehab with 36 sessions completed.  Details of the patient's exercise prescription and what She needs to do in order to continue the prescription and progress were discussed with patient.  Patient was given a copy of prescription and goals.  Patient verbalized understanding.  Lanyia plans to continue to exercise by walking, yoga, using weights.              Comments: Discharge  ITP

## 2022-12-25 NOTE — Progress Notes (Signed)
Discharge Summary   Stacey Francis DOB: Mar 24, 2056   Stacey Francis graduated today from  rehab with 36 sessions completed.  Details of the patient's exercise prescription and what She needs to do in order to continue the prescription and progress were discussed with patient.  Patient was given a copy of prescription and goals.  Patient verbalized understanding.  Stacey Francis plans to continue to exercise by walking, yoga, using weights.   6 Minute Walk     Row Name 09/18/22 1651 12/04/22 1554       6 Minute Walk   Phase Initial Discharge    Distance 1135 feet 1385 feet    Distance % Change -- 22 %    Distance Feet Change -- 250 ft    Walk Time 6 minutes 6 minutes    # of Rest Breaks 0 0    MPH 2.15 2.62    METS 3.18 3.44    RPE 9 10    Perceived Dyspnea  0 0    VO2 Peak 11.13 12.04    Symptoms No No    Resting HR 83 bpm 78 bpm    Resting BP 132/82 112/62    Resting Oxygen Saturation  99 % 99 %    Exercise Oxygen Saturation  during 6 min walk 97 % 99 %    Max Ex. HR 111 bpm 99 bpm    Max Ex. BP 130/82 124/82    2 Minute Post BP 118/62 118/70

## 2022-12-25 NOTE — Progress Notes (Signed)
Daily Session Note  Patient Details  Name: Stacey Francis MRN: [id number] Date of Birth: [birth year] Referring Provider:   Flowsheet Row Cardiac Rehab from 09/18/2022 in [facility] Cardiac [facility] Pulmonary Re[facility]  Referring Provider callwood       Encounter Date: 12/25/2022  Check In:  Session Check In - 12/25/22 1544       Check-In   Supervising physician immediately available to respond to emergencies See telemetry face sheet for immediately available ER MD    Location [facility]-Cardiac & Pulmonary Rehab    Staff Present Susann Givens, RN BSN;Joseph [place], RCP,RRT,BSRT;Laureen Charlo, [place], RRT, CPFT    Virtual Visit No    Medication changes reported     No    Fall or balance concerns reported    No    Warm-up and Cool-down Performed on first and last piece of equipment    Resistance Training Performed Yes    VAD Patient? No    PAD/SET Patient? No      Pain Assessment   Currently in Pain? No/denies                Social History   Tobacco Use  Smoking Status Never  Smokeless Tobacco Never    Goals Met:  Independence with exercise equipment Exercise tolerated well No report of concerns or symptoms today Strength training completed today  Goals Unmet:  Not Applicable  Comments:  Stacey Francis graduated today from  rehab with 36 sessions completed.  Details of the patient's exercise prescription and what She needs to do in order to continue the prescription and progress were discussed with patient.  Patient was given a copy of prescription and goals.  Patient verbalized understanding.  Stacey Francis plans to continue to exercise by walking, yoga, using weights.    Dr. Bethann Punches is Medical Director for [facility] Cardiac Rehabilitation.  Dr. Vida Rigger is Medical Director for [facility] Pulmonary Rehabilitation.

## 2023-01-01 DIAGNOSIS — Z1211 Encounter for screening for malignant neoplasm of colon: Secondary | ICD-10-CM | POA: Diagnosis not present

## 2023-01-01 DIAGNOSIS — Z1212 Encounter for screening for malignant neoplasm of rectum: Secondary | ICD-10-CM | POA: Diagnosis not present

## 2023-01-06 DIAGNOSIS — R109 Unspecified abdominal pain: Secondary | ICD-10-CM | POA: Diagnosis not present

## 2023-01-07 LAB — COLOGUARD: COLOGUARD: NEGATIVE

## 2023-01-25 DIAGNOSIS — I2583 Coronary atherosclerosis due to lipid rich plaque: Secondary | ICD-10-CM | POA: Diagnosis not present

## 2023-01-25 DIAGNOSIS — I351 Nonrheumatic aortic (valve) insufficiency: Secondary | ICD-10-CM | POA: Diagnosis not present

## 2023-01-25 DIAGNOSIS — Z7901 Long term (current) use of anticoagulants: Secondary | ICD-10-CM | POA: Diagnosis not present

## 2023-01-25 DIAGNOSIS — E785 Hyperlipidemia, unspecified: Secondary | ICD-10-CM | POA: Diagnosis not present

## 2023-01-25 DIAGNOSIS — Z955 Presence of coronary angioplasty implant and graft: Secondary | ICD-10-CM | POA: Diagnosis not present

## 2023-01-25 DIAGNOSIS — E7841 Elevated Lipoprotein(a): Secondary | ICD-10-CM | POA: Diagnosis not present

## 2023-01-25 DIAGNOSIS — I2510 Atherosclerotic heart disease of native coronary artery without angina pectoris: Secondary | ICD-10-CM | POA: Diagnosis not present

## 2023-01-25 DIAGNOSIS — I252 Old myocardial infarction: Secondary | ICD-10-CM | POA: Diagnosis not present

## 2023-01-25 DIAGNOSIS — I340 Nonrheumatic mitral (valve) insufficiency: Secondary | ICD-10-CM | POA: Diagnosis not present

## 2023-01-25 DIAGNOSIS — I10 Essential (primary) hypertension: Secondary | ICD-10-CM | POA: Diagnosis not present

## 2023-01-31 ENCOUNTER — Telehealth: Payer: Self-pay | Admitting: Family Medicine

## 2023-01-31 NOTE — Telephone Encounter (Signed)
Copied from CRM [id number]. Topic: General - Other >> Jan 30, 2023  2:39 PM Santiya F wrote: Reason for CRM: Mindi Junker with [facility] is calling in wanting to know if there is a copy of the pt's insurance card on file that can be faxed over to her: [phone] , Mindi Junker say she is scheduling the pt's ultrasound and just needs it for that.

## 2023-02-02 ENCOUNTER — Other Ambulatory Visit: Payer: Self-pay

## 2023-02-02 ENCOUNTER — Emergency Department
Admission: EM | Admit: 2023-02-02 | Discharge: 2023-02-02 | Disposition: A | Discharge status: Home / Self Care | Payer: No Typology Code available for payment source | Attending: Emergency Medicine | Admitting: Emergency Medicine

## 2023-02-02 ENCOUNTER — Emergency Department: Payer: No Typology Code available for payment source

## 2023-02-02 DIAGNOSIS — R1032 Left lower quadrant pain: Secondary | ICD-10-CM | POA: Diagnosis not present

## 2023-02-02 DIAGNOSIS — N200 Calculus of kidney: Secondary | ICD-10-CM | POA: Diagnosis not present

## 2023-02-02 DIAGNOSIS — I10 Essential (primary) hypertension: Secondary | ICD-10-CM | POA: Insufficient documentation

## 2023-02-02 DIAGNOSIS — K4090 Unilateral inguinal hernia, without obstruction or gangrene, not specified as recurrent: Secondary | ICD-10-CM | POA: Diagnosis not present

## 2023-02-02 DIAGNOSIS — K5730 Diverticulosis of large intestine without perforation or abscess without bleeding: Secondary | ICD-10-CM | POA: Diagnosis not present

## 2023-02-02 DIAGNOSIS — K8020 Calculus of gallbladder without cholecystitis without obstruction: Secondary | ICD-10-CM | POA: Diagnosis not present

## 2023-02-02 LAB — COMPREHENSIVE METABOLIC PANEL
ALT: 16 U/L (ref 0–44)
AST: 21 U/L (ref 15–41)
Albumin: 4.4 g/dL (ref 3.5–5.0)
Alkaline Phosphatase: 41 U/L (ref 38–126)
Anion gap: 11 (ref 5–15)
BUN: 15 mg/dL (ref 8–23)
CO2: 23 mmol/L (ref 22–32)
Calcium: 9.2 mg/dL (ref 8.9–10.3)
Chloride: 96 mmol/L — ABNORMAL LOW (ref 98–111)
Creatinine, Ser: 0.83 mg/dL (ref 0.44–1.00)
GFR, Estimated: >60 mL/min (ref >60)
Glucose, Bld: 84 mg/dL (ref 70–99)
Potassium: 4.1 mmol/L (ref 3.5–5.1)
Sodium: 130 mmol/L — ABNORMAL LOW (ref 135–145)
Total Bilirubin: 1.2 mg/dL (ref 0.3–1.2)
Total Protein: 7.4 g/dL (ref 6.5–8.1)

## 2023-02-02 LAB — CBC
HCT: 36.6 % (ref 36.0–46.0)
Hemoglobin: 12.3 g/dL (ref 12.0–15.0)
MCH: 29.6 pg (ref 26.0–34.0)
MCHC: 33.6 g/dL (ref 30.0–36.0)
MCV: 88.2 fL (ref 80.0–100.0)
Platelets: 227 10*3/uL (ref 150–400)
RBC: 4.15 MIL/uL (ref 3.87–5.11)
RDW: 12.6 % (ref 11.5–15.5)
WBC: 6.6 10*3/uL (ref 4.0–10.5)
nRBC: 0 % (ref 0.0–0.2)

## 2023-02-02 LAB — URINALYSIS, COMPLETE (UACMP) WITH MICROSCOPIC
Bilirubin Urine: NEGATIVE
Glucose, UA: NEGATIVE mg/dL
Hgb urine dipstick: NEGATIVE
Ketones, ur: 5 mg/dL — AB
Nitrite: NEGATIVE
Protein, ur: NEGATIVE mg/dL
Specific Gravity, Urine: 1.011 (ref 1.005–1.030)
pH: 6 (ref 5.0–8.0)

## 2023-02-02 MED ORDER — NITROFURANTOIN MONOHYD MACRO 100 MG PO CAPS
100.0000 mg | ORAL_CAPSULE | Freq: Once | ORAL | Status: AC
  Administered 2023-02-02: 100 mg via ORAL
  Filled 2023-02-02: qty 1

## 2023-02-02 MED ORDER — DICYCLOMINE HCL 10 MG PO CAPS
10.0000 mg | ORAL_CAPSULE | Freq: Once | ORAL | Status: AC
  Administered 2023-02-02: 10 mg via ORAL
  Filled 2023-02-02: qty 1

## 2023-02-02 MED ORDER — NITROFURANTOIN MONOHYD MACRO 100 MG PO CAPS: 100.0000 mg | ORAL_CAPSULE | Freq: Two times a day (BID) | ORAL | 0 refills | Status: AC

## 2023-02-02 MED ORDER — DICYCLOMINE HCL 10 MG PO CAPS: 10.0000 mg | ORAL_CAPSULE | Freq: Three times a day (TID) | ORAL | 0 refills | Status: AC | PRN

## 2023-02-02 NOTE — ED Triage Notes (Signed)
Pt c/o left lower quadrant abdominal pain for last 3 weeks intermittently. Pt states she's scheduled for a sonogram, but the pain restarted yesterday. Pt reports mild nausea, denies V/D, denies urinary s/s, denies blood in urine or stool. Pt AOX4, NAD noted. Abdomen is round, soft, flat.

## 2023-02-02 NOTE — ED Provider Notes (Signed)
[facility] Provider Note    Event Date/Time   First MD Initiated Contact with Patient 02/02/23 2053     (approximate)  History   Chief Complaint: Abdominal Pain  HPI  PHENICIA BENNETTE is a 67 y.o. female with a past medical hypertension, presents to the emergency department for left lower quadrant domino pain.  According to the patient for the past 3 to 4 weeks she has been experiencing pain in the left lower quadrant.  States it has been coming and going but is now present more often than it is not.  Patient denies any constipation or diarrhea denies any urinary symptoms denies any fever.  Physical Exam   Triage Vital Signs: ED Triage Vitals [02/02/23 1646]  Encounter Vitals Group     BP (!) 161/73     Systolic BP Percentile      Diastolic BP Percentile      Pulse Rate 85     Resp 18     Temp 98.2 F (36.8 C)     Temp Source Oral     SpO2 100 %     Weight      Height      Head Circumference      Peak Flow      Pain Score 8     Pain Loc      Pain Education      Exclude from Growth Chart     Most recent vital signs: Vitals:   02/02/23 1646  BP: (!) 161/73  Pulse: 85  Resp: 18  Temp: 98.2 F (36.8 C)  SpO2: 100%    General: Awake, no distress.  CV:  Good peripheral perfusion.  Regular rate and rhythm  Resp:  Normal effort.  Equal breath sounds bilaterally.  Abd:  No distention.  Soft, nontender.  No rebound or guarding.  Largely benign abdomen. Other:  No rash noted.   ED Results / Procedures / Treatments   RADIOLOGY  I have reviewed and interpreted CT images.  No obvious or significant abnormality seen on my evaluation. Radiology is read the CT scan is essentially negative for acute abnormality.   MEDICATIONS ORDERED IN ED: Medications  nitrofurantoin (macrocrystal-monohydrate) (MACROBID) capsule 100 mg (has no administration in time range)  dicyclomine (BENTYL) capsule 10 mg (has no administration in time range)      IMPRESSION / MDM / ASSESSMENT AND PLAN / ED COURSE  I reviewed the triage vital signs and the nursing notes.  Patient's presentation is most consistent with acute presentation with potential threat to life or bodily function.  Patient presents emergency department for left lower quadrant abdominal pain intermittent over the past 3 to 4 weeks.  My exam patient is nontender in the left lower quadrant.  She describes the discomfort more as a burning sensation to this area there is no rash noted.  Patient's workup has resulted showing a reassuring CBC with a normal white blood cell count, reassuring chemistry with normal LFTs, reassuring CT scan.  Patient's urinalysis has resulted showing 11-20 white cells and rare bacteria with no squamous cells.  Given this finding we will send a urine culture as a precaution and cover with antibiotics.  Given the left lower quadrant intermittent discomfort we will prescribe a short course of Bentyl to see if this helps the patient's symptoms.  Will have the patient follow-up with GI medicine for further evaluation.  Patient agreeable to plan of care.  FINAL CLINICAL IMPRESSION(S) / ED DIAGNOSES  Left-sided abdominal pain  Rx / DC Orders   Bentyl Macrobid  Note:  This document was prepared using Dragon voice recognition software and may include unintentional dictation errors.   Minna Antis, MD 02/02/23 2111

## 2023-02-02 NOTE — Discharge Instructions (Addendum)
As we discussed please take your medications as written.  Please follow-up with GI medicine if you continue to have discomfort.  Return to the emergency department for any significant worsening of pain, fever, or any other symptom personally concerning to yourself.

## 2023-02-09 ENCOUNTER — Other Ambulatory Visit (INDEPENDENT_AMBULATORY_CARE_PROVIDER_SITE_OTHER): Payer: Self-pay | Admitting: Vascular Surgery

## 2023-02-09 DIAGNOSIS — R42 Dizziness and giddiness: Secondary | ICD-10-CM

## 2023-02-12 ENCOUNTER — Other Ambulatory Visit: Payer: Self-pay | Admitting: Nurse Practitioner

## 2023-02-12 ENCOUNTER — Ambulatory Visit (INDEPENDENT_AMBULATORY_CARE_PROVIDER_SITE_OTHER): Payer: No Typology Code available for payment source | Admitting: Vascular Surgery

## 2023-02-12 ENCOUNTER — Encounter (INDEPENDENT_AMBULATORY_CARE_PROVIDER_SITE_OTHER): Payer: Self-pay | Admitting: Vascular Surgery

## 2023-02-12 ENCOUNTER — Ambulatory Visit (INDEPENDENT_AMBULATORY_CARE_PROVIDER_SITE_OTHER): Payer: Self-pay

## 2023-02-12 VITALS — BP 127/72 | HR 79 | Resp 16 | Wt 114.0 lb

## 2023-02-12 DIAGNOSIS — I6523 Occlusion and stenosis of bilateral carotid arteries: Secondary | ICD-10-CM | POA: Diagnosis not present

## 2023-02-12 DIAGNOSIS — Z1231 Encounter for screening mammogram for malignant neoplasm of breast: Secondary | ICD-10-CM

## 2023-02-12 DIAGNOSIS — I25119 Atherosclerotic heart disease of native coronary artery with unspecified angina pectoris: Secondary | ICD-10-CM | POA: Diagnosis not present

## 2023-02-12 DIAGNOSIS — R55 Syncope and collapse: Secondary | ICD-10-CM | POA: Diagnosis not present

## 2023-02-12 DIAGNOSIS — I739 Peripheral vascular disease, unspecified: Secondary | ICD-10-CM | POA: Diagnosis not present

## 2023-02-12 DIAGNOSIS — I6529 Occlusion and stenosis of unspecified carotid artery: Secondary | ICD-10-CM | POA: Insufficient documentation

## 2023-02-12 DIAGNOSIS — I10 Essential (primary) hypertension: Secondary | ICD-10-CM

## 2023-02-12 DIAGNOSIS — R42 Dizziness and giddiness: Secondary | ICD-10-CM

## 2023-02-12 NOTE — Progress Notes (Signed)
MRN : [id number]  Stacey Francis is a 67 y.o. ([birth year]) female who presents with chief complaint of check carotid arteries.  History of Present Illness:   The patient is seen for evaluation of a syncopal episode. The patient describes it as a light headedness and denies the "room spinning".  It lasts for a period of time and resolved completely.  Lately she feels that it has been getting better.  There was no loss of consciousness.    There is no recent history of TIA symptoms or focal motor deficits. There is no prior documented CVA.  The patient was not taking enteric-coated aspirin 81 mg daily at the time.  There is no history of migraine headaches or prior diagnosis of ocular migraine. There is no history of seizures.  No recent shortening of the patient's walking distance or new symptoms consistent with claudication.  No history of rest pain symptoms. No new ulcers or wounds of the lower extremities have occurred.  There is no history of DVT, PE or superficial thrombophlebitis. No recent episodes of angina or shortness of breath documented.   Duplex ultrasound of the carotid arteries obtained today demonstrates RICA 40-59% and LICA 40-59%.  Bilateral vertebral arteries demonstrate antegrade flow with normal velocities  No outpatient medications have been marked as taking for the 02/12/23 encounter (Appointment) with Gilda Crease, Latina Craver, MD.    Past Medical History:  Diagnosis Date   Hypertension    Sinus infection     Past Surgical History:  Procedure Laterality Date   CORONARY/GRAFT ACUTE MI REVASCULARIZATION N/A 08/13/2022   Procedure: Coronary/Graft Acute MI Revascularization;  Surgeon: Alwyn Pea, MD;  Location: [facility] INVASIVE CV LAB;  Service: Cardiovascular;  Laterality: N/A;   ECTOPIC PREGNANCY SURGERY     LEFT HEART CATH AND CORONARY ANGIOGRAPHY N/A 08/13/2022   Procedure: LEFT HEART CATH AND CORONARY ANGIOGRAPHY;  Surgeon: Alwyn Pea, MD;  Location: [facility] INVASIVE CV LAB;  Service: Cardiovascular;  Laterality: N/A;    Social History Social History   Tobacco Use   Smoking status: Never   Smokeless tobacco: Never  Vaping Use   Vaping status: Never Used  Substance Use Topics   Alcohol use: No   Drug use: No    Family History No family history on file.  Allergies  Allergen Reactions   Penicillins Swelling     REVIEW OF SYSTEMS (Negative unless checked)  Constitutional: [] Weight loss  [] Fever  [] Chills Cardiac: [] Chest pain   [] Chest pressure   [] Palpitations   [] Shortness of breath when laying flat   [] Shortness of breath with exertion. Vascular:  [x] Pain in legs with walking   [] Pain in legs at rest  [] History of DVT   [] Phlebitis   [] Swelling in legs   [] Varicose veins   [] Non-healing ulcers Pulmonary:   [] Uses home oxygen   [] Productive cough   [] Hemoptysis   [] Wheeze  [] COPD   [] Asthma Neurologic:  [x] Dizziness   [] Seizures   [] History of stroke   [] History of TIA  [] Aphasia   [] Vissual changes   [] Weakness or numbness in arm   [] Weakness or numbness in leg Musculoskeletal:   [] Joint swelling   [] Joint pain   [] Low back pain Hematologic:  [] Easy bruising  [] Easy bleeding   [] Hypercoagulable state   [] Anemic Gastrointestinal:  [] Diarrhea   [] Vomiting  [] Gastroesophageal reflux/heartburn   []   Difficulty swallowing. Genitourinary:  [] Chronic kidney disease   [] Difficult urination  [] Frequent urination   [] Blood in urine Skin:  [] Rashes   [] Ulcers  Psychological:  [] History of anxiety   []  History of major depression.  Physical Examination  There were no vitals filed for this visit. There is no height or weight on file to calculate BMI. Gen: WD/WN, NAD Head: [place]/AT, No temporalis wasting.  Ear/Nose/Throat: Hearing grossly intact, nares w/o erythema or drainage Eyes: PER, EOMI, sclera nonicteric.  Neck: Supple, no masses.  No bruit or JVD.  Pulmonary:  Good air movement, no audible wheezing, no  use of accessory muscles.  Cardiac: RRR, normal S1, S2, no Murmurs. Vascular:  carotid bruit noted Vessel Right Left  Radial Palpable Palpable  Carotid  Palpable  Palpable  Subclav  Palpable Palpable  Gastrointestinal: soft, non-distended. No guarding/no peritoneal signs.  Musculoskeletal: M/S 5/5 throughout.  No visible deformity.  Neurologic: CN 2-12 intact. Pain and light touch intact in extremities.  Symmetrical.  Speech is fluent. Motor exam as listed above. Psychiatric: Judgment intact, Mood & affect appropriate for pt's clinical situation. Dermatologic: No rashes or ulcers noted.  No changes consistent with cellulitis.   CBC Lab Results  Component Value Date   WBC 6.6 02/02/2023   HGB 12.3 02/02/2023   HCT 36.6 02/02/2023   MCV 88.2 02/02/2023   PLT 227 02/02/2023    BMET    Component Value Date/Time   NA 130 (L) 02/02/2023 1644   K 4.1 02/02/2023 1644   CL 96 (L) 02/02/2023 1644   CO2 23 02/02/2023 1644   GLUCOSE 84 02/02/2023 1644   BUN 15 02/02/2023 1644   CREATININE 0.83 02/02/2023 1644   CALCIUM 9.2 02/02/2023 1644   GFRNONAA >60 02/02/2023 1644   GFRAA >60 10/14/2019 1437   CrCl cannot be calculated (Unknown ideal weight.).  COAG Lab Results  Component Value Date   INR 1.0 08/13/2022   INR 0.9 11/09/2020   INR 0.9 10/11/2018    Radiology CT ABDOMEN PELVIS WO CONTRAST  Result Date: 02/02/2023 CLINICAL DATA:  Left lower quadrant abdominal pain intermittently for 3 weeks, nausea EXAM: CT ABDOMEN AND PELVIS WITHOUT CONTRAST TECHNIQUE: Multidetector CT imaging of the abdomen and pelvis was performed following the standard protocol without IV contrast. RADIATION DOSE REDUCTION: This exam was performed according to the departmental dose-optimization program which includes automated exposure control, adjustment of the mA and/or kV according to patient size and/or use of iterative reconstruction technique. COMPARISON:  12/01/2022 FINDINGS: Lower chest: No acute  pleural or parenchymal lung disease. Hepatobiliary: Small calcified gallstone without evidence of acute cholecystitis. Unremarkable unenhanced appearance of the liver. Pancreas: Unremarkable unenhanced appearance. Spleen: Unremarkable unenhanced appearance. Adrenals/Urinary Tract: There is a punctate less than 2 mm nonobstructing calculus lower pole right kidney reference image 29/3. No left-sided calculi. No obstructive uropathy. The adrenals and bladder are unremarkable. Stomach/Bowel: No bowel obstruction or ileus. Normal appendix right lower quadrant. Diverticulosis of the descending and sigmoid colon without evidence of acute diverticulitis. No bowel wall thickening or inflammatory change. Vascular/Lymphatic: Aortic atherosclerosis. No enlarged abdominal or pelvic lymph nodes. Reproductive: Uterus and bilateral adnexa are unremarkable. Other: No free fluid or free intraperitoneal gas. Small fat containing left inguinal hernia unchanged. No bowel herniation. Musculoskeletal: No acute or destructive bony abnormalities. Reconstructed images demonstrate no additional findings. IMPRESSION: 1. Punctate nonobstructing 2 mm calculus lower pole right kidney. No obstructive uropathy. 2. Stable small fat containing left inguinal hernia. No bowel herniation. 3. Cholelithiasis without  cholecystitis. 4. Diverticulosis without diverticulitis. 5.  Aortic Atherosclerosis (ICD10-I70.0). Electronically Signed   By: Sharlet Salina M.D.   On: 02/02/2023 18:10     Assessment/Plan 1. Syncope, unspecified syncope type There does not appear to be a cardiac or vascular etiology for her syncope.  I suspect that this could be viral or secondary to inner ear dysfunction.  Since it seems to be getting better I would continue conservative therapies but have recommended to her that an ENT evaluation would be appropriate if the symptoms persist or get worse.  2. Bilateral carotid artery stenosis Recommend:  Given the patient's  asymptomatic subcritical stenosis no further invasive testing or surgery at this time.  Duplex ultrasound shows 40-59% stenosis bilaterally.  Continue antiplatelet therapy as prescribed Continue management of CAD, HTN and Hyperlipidemia Healthy heart diet,  encouraged exercise at least 4 times per week Follow up in 12 months with duplex ultrasound and physical exam  - VAS US CAROTID; Future  3. PAD (peripheral artery disease) (HCC) Recommend:  I do not find evidence of life style limiting vascular disease. The patient specifically denies life style limitation.  Previous noninvasive studies including ABI's of the legs do not identify critical vascular problems.  The patient should continue walking and begin a more formal exercise program. The patient should continue his antiplatelet therapy and aggressive treatment of the lipid abnormalities.  The patient is instructed to call the office if there is a significant change in the lower extremity symptoms, particularly if a wound develops or there is an abrupt increase in leg pain.  4. Coronary artery disease involving native coronary artery of native heart with angina pectoris (HCC) Continue cardiac and antihypertensive medications as already ordered and reviewed, no changes at this time.  Continue statin as ordered and reviewed, no changes at this time  Nitrates PRN for chest pain  5. Essential hypertension Continue antihypertensive medications as already ordered, these medications have been reviewed and there are no changes at this time.    Levora Dredge, MD  02/12/2023 1:16 PM

## 2023-02-19 ENCOUNTER — Other Ambulatory Visit: Payer: Self-pay | Admitting: Family Medicine

## 2023-02-19 DIAGNOSIS — R109 Unspecified abdominal pain: Secondary | ICD-10-CM

## 2023-02-19 DIAGNOSIS — R102 Pelvic and perineal pain: Secondary | ICD-10-CM

## 2023-04-04 ENCOUNTER — Ambulatory Visit
Admission: RE | Admit: 2023-04-04 | Discharge: 2023-04-04 | Disposition: A | Discharge status: Home / Self Care | Payer: No Typology Code available for payment source | Source: Ambulatory Visit | Attending: Nurse Practitioner | Admitting: Nurse Practitioner

## 2023-04-04 DIAGNOSIS — Z1231 Encounter for screening mammogram for malignant neoplasm of breast: Secondary | ICD-10-CM | POA: Insufficient documentation

## 2023-05-17 ENCOUNTER — Other Ambulatory Visit: Payer: Self-pay | Admitting: Internal Medicine

## 2023-05-17 DIAGNOSIS — Z1231 Encounter for screening mammogram for malignant neoplasm of breast: Secondary | ICD-10-CM

## 2023-05-17 DIAGNOSIS — Z13820 Encounter for screening for osteoporosis: Secondary | ICD-10-CM

## 2023-05-31 ENCOUNTER — Other Ambulatory Visit: Payer: No Typology Code available for payment source

## 2023-06-14 ENCOUNTER — Ambulatory Visit
Admission: RE | Admit: 2023-06-14 | Discharge: 2023-06-14 | Disposition: A | Discharge status: Home / Self Care | Payer: No Typology Code available for payment source | Source: Ambulatory Visit | Attending: Internal Medicine | Admitting: Internal Medicine

## 2023-06-14 ENCOUNTER — Inpatient Hospital Stay: Admission: RE | Admit: 2023-06-14 | Payer: No Typology Code available for payment source | Source: Ambulatory Visit

## 2023-06-14 DIAGNOSIS — Z13820 Encounter for screening for osteoporosis: Secondary | ICD-10-CM | POA: Diagnosis present

## 2023-06-14 DIAGNOSIS — M810 Age-related osteoporosis without current pathological fracture: Secondary | ICD-10-CM | POA: Diagnosis not present

## 2023-06-14 DIAGNOSIS — Z780 Asymptomatic menopausal state: Secondary | ICD-10-CM | POA: Diagnosis not present

## 2023-10-23 ENCOUNTER — Encounter (INDEPENDENT_AMBULATORY_CARE_PROVIDER_SITE_OTHER): Payer: Self-pay

## 2023-11-16 ENCOUNTER — Encounter (HOSPITAL_BASED_OUTPATIENT_CLINIC_OR_DEPARTMENT_OTHER): Payer: Self-pay

## 2023-11-16 ENCOUNTER — Other Ambulatory Visit: Payer: Self-pay

## 2023-11-16 ENCOUNTER — Emergency Department (HOSPITAL_BASED_OUTPATIENT_CLINIC_OR_DEPARTMENT_OTHER)

## 2023-11-16 ENCOUNTER — Emergency Department (HOSPITAL_BASED_OUTPATIENT_CLINIC_OR_DEPARTMENT_OTHER)
Admission: EM | Admit: 2023-11-16 | Discharge: 2023-11-17 | Disposition: A | Discharge status: Home / Self Care | Attending: Emergency Medicine | Admitting: Emergency Medicine

## 2023-11-16 DIAGNOSIS — I2510 Atherosclerotic heart disease of native coronary artery without angina pectoris: Secondary | ICD-10-CM | POA: Diagnosis not present

## 2023-11-16 DIAGNOSIS — I10 Essential (primary) hypertension: Secondary | ICD-10-CM | POA: Insufficient documentation

## 2023-11-16 DIAGNOSIS — R079 Chest pain, unspecified: Secondary | ICD-10-CM | POA: Insufficient documentation

## 2023-11-16 LAB — TROPONIN T, HIGH SENSITIVITY: Troponin T High Sensitivity: <15 ng/L (ref <19)

## 2023-11-16 LAB — BASIC METABOLIC PANEL WITH GFR
Anion gap: 13 (ref 5–15)
BUN: 14 mg/dL (ref 8–23)
CO2: 24 mmol/L (ref 22–32)
Calcium: 9.4 mg/dL (ref 8.9–10.3)
Chloride: 100 mmol/L (ref 98–111)
Creatinine, Ser: 0.96 mg/dL (ref 0.44–1.00)
GFR, Estimated: >60 mL/min (ref >60)
Glucose, Bld: 123 mg/dL — ABNORMAL HIGH (ref 70–99)
Potassium: 3.8 mmol/L (ref 3.5–5.1)
Sodium: 136 mmol/L (ref 135–145)

## 2023-11-16 LAB — CBC
HCT: 35 % — ABNORMAL LOW (ref 36.0–46.0)
Hemoglobin: 11.6 g/dL — ABNORMAL LOW (ref 12.0–15.0)
MCH: 28.7 pg (ref 26.0–34.0)
MCHC: 33.1 g/dL (ref 30.0–36.0)
MCV: 86.6 fL (ref 80.0–100.0)
Platelets: 216 10*3/uL (ref 150–400)
RBC: 4.04 MIL/uL (ref 3.87–5.11)
RDW: 12.2 % (ref 11.5–15.5)
WBC: 5.2 10*3/uL (ref 4.0–10.5)
nRBC: 0 % (ref 0.0–0.2)

## 2023-11-16 MED ORDER — ASPIRIN 81 MG PO CHEW
324.0000 mg | CHEWABLE_TABLET | Freq: Once | ORAL | Status: AC
  Administered 2023-11-16: 324 mg via ORAL
  Filled 2023-11-16: qty 4

## 2023-11-16 NOTE — ED Triage Notes (Signed)
 Pt reports fatigue x1 hour ago and began to develop chest pressure. Pt endorses chills. Pt denies any N/V/D. Pt reports MI x1 year ago. Pt has x2 stents.

## 2023-11-17 LAB — TROPONIN T, HIGH SENSITIVITY: Troponin T High Sensitivity: <15 ng/L (ref <19)

## 2023-11-17 NOTE — Discharge Instructions (Signed)
 You were evaluated in the Emergency Department and after careful evaluation, we did not find any emergent condition requiring admission or further testing in the hospital.  Your exam/testing today is overall reassuring.  Be sure to follow-up with your cardiologist closely for any needed additional testing as we discussed.  Make sure you are taking your daily baby aspirin  as well as your other home medications.  Please return to the Emergency Department if you experience any worsening of your condition.   Thank you for allowing us  to be a part of your care.

## 2023-11-17 NOTE — ED Provider Notes (Signed)
 [facility]GENCY [facility] Emergency Department Provider Note MRN:  [id number]  Arrival date & time: 11/17/23     Chief Complaint   Chest Pain and Fatigue (/)   History of Present Illness   Stacey Francis is a 68 y.o. year-old female with a history of hypertension presenting to the ED with chief complaint of chest pain.  Patient came down with sudden onset total body fatigue, malaise.  Then began feeling some discomfort in her right lateral neck and shoulder, then began feeling some mild chest pressure.  Had some similar chest pressure and neck pain 1 or 2 weeks ago.  History of heart attack.  Review of Systems  A thorough review of systems was obtained and all systems are negative except as noted in the HPI and PMH.   Patient's Health History    Past Medical History:  Diagnosis Date   Hypertension    Sinus infection     Past Surgical History:  Procedure Laterality Date   CORONARY/GRAFT ACUTE MI REVASCULARIZATION N/A 08/13/2022   Procedure: Coronary/Graft Acute MI Revascularization;  Surgeon: Antonette Batters, MD;  Location: [facility] INVASIVE CV LAB;  Service: Cardiovascular;  Laterality: N/A;   ECTOPIC PREGNANCY SURGERY     LEFT HEART CATH AND CORONARY ANGIOGRAPHY N/A 08/13/2022   Procedure: LEFT HEART CATH AND CORONARY ANGIOGRAPHY;  Surgeon: Antonette Batters, MD;  Location: [facility] INVASIVE CV LAB;  Service: Cardiovascular;  Laterality: N/A;    Family History  Problem Relation Age of Onset   Liver disease Mother    Stroke Father    Stroke Brother    Heart attack Brother    Breast cancer Neg Hx     Social History   Socioeconomic History   Marital status: Divorced    Spouse name: Not on file   Number of children: Not on file   Years of education: Not on file   Highest education level: Not on file  Occupational History   Not on file  Tobacco Use   Smoking status: Never   Smokeless tobacco: Never  Vaping Use   Vaping status: Never Used  Substance and Sexual  Activity   Alcohol use: No   Drug use: No   Sexual activity: Not on file  Other Topics Concern   Not on file  Social History Narrative   Not on file   Social Drivers of Health   Financial Resource Strain: Not on file  Food Insecurity: No Food Insecurity (08/13/2022)   Hunger Vital Sign    Worried About Running Out of Food in the Last Year: Never true    Ran Out of Food in the Last Year: Never true  Transportation Needs: No Transportation Needs (08/13/2022)   PRAPARE - Administrator, Civil Service (Medical): No    Lack of Transportation (Non-Medical): No  Physical Activity: Not on file  Stress: Not on file  Social Connections: Not on file  Intimate Partner Violence: Not At Risk (08/13/2022)   Humiliation, Afraid, Rape, and Kick questionnaire    Fear of Current or Ex-Partner: No    Emotionally Abused: No    Physically Abused: No    Sexually Abused: No     Physical Exam   Vitals:   11/16/23 2253 11/16/23 2255  BP: (!) 170/104   Pulse: 79   Resp: 18   Temp:  97.8 F (36.6 C)  SpO2: 100%     CONSTITUTIONAL: Well-appearing, NAD NEURO/PSYCH:  Alert and oriented x 3, no focal  deficits EYES:  eyes equal and reactive ENT/NECK:  no LAD, no JVD CARDIO: Regular rate, well-perfused, normal S1 and S2 PULM:  CTAB no wheezing or rhonchi GI/GU:  non-distended, non-tender MSK/SPINE:  No gross deformities, no edema SKIN:  no rash, atraumatic   *Additional and/or pertinent findings included in MDM below  Diagnostic and Interventional Summary    EKG Interpretation Date/Time:  November 16, 2023 at 22:55:22 Ventricular Rate:   80 PR Interval:   178 QRS Duration:   76 QT Interval:   381 QTC Calculation:  440 R Axis:      Text Interpretation: Sinus rhythm, no concerning ischemic findings       Labs Reviewed  BASIC METABOLIC PANEL WITH GFR - Abnormal; Notable for the following components:      Result Value   Glucose, Bld 123 (*)    All other components within  normal limits  CBC - Abnormal; Notable for the following components:   Hemoglobin 11.6 (*)    HCT 35.0 (*)    All other components within normal limits  TROPONIN T, HIGH SENSITIVITY  TROPONIN T, HIGH SENSITIVITY    DG Chest Port 1 View  Final Result      Medications  aspirin  chewable tablet 324 mg (324 mg Oral Given 11/16/23 2349)     Procedures  /  Critical Care Procedures  ED Course and Medical Decision Making  Initial Impression and Ddx Patient has a history of fairly extensive CAD, presented with a STEMI last year, 2 stents placed at that time.  Was told that she had a vessel that could not be stented, may have benefited from a bypass surgery.  Has been doing well since the stents but for the past 2 weeks having occasional chest discomfort.  Currently with minimal shoulder soreness, question MSK versus referred pain.  Will need troponin evaluation, cardiology consultation.  Past medical/surgical history that increases complexity of ED encounter: CAD  Interpretation of Diagnostics I personally reviewed the EKG and my interpretation is as follows: Sinus rhythm without concerning ischemic findings  No significant blood count or electrolyte disturbance.  Troponin negative x 2  Patient Reassessment and Ultimate Disposition/Management     Case discussed with Dr. Teofilo Fellers of cardiology.  Given this being a Saturday morning and patient's workup being negative and her symptoms being largely resolved, there is not much to offer with an admission at this time, likely would not have much done until Monday and there is no promise that she would get a catheter or stress test at that time either.  This was discussed with the patient and we went through options of admission versus close outpatient follow-up.  Patient agreeable with close outpatient follow-up and strict return precautions.  Patient management required discussion with the following services or consulting groups:   Cardiology  Complexity of Problems Addressed Acute illness or injury that poses threat of life of bodily function  Additional Data Reviewed and Analyzed Further history obtained from: Further history from spouse/family member  Additional Factors Impacting ED Encounter Risk Consideration of hospitalization  Merrick Abe. Harless Lien, MD [facility] Emergency Medicine [facility] [email or web address]  Final Clinical Impressions(s) / ED Diagnoses     ICD-10-CM   1. Chest pain, unspecified type  R07.9       ED Discharge Orders     None        Discharge Instructions Discussed with and Provided to Patient:    Discharge Instructions      You  were evaluated in the Emergency Department and after careful evaluation, we did not find any emergent condition requiring admission or further testing in the hospital.  Your exam/testing today is overall reassuring.  Be sure to follow-up with your cardiologist closely for any needed additional testing as we discussed.  Make sure you are taking your daily baby aspirin  as well as your other home medications.  Please return to the Emergency Department if you experience any worsening of your condition.   Thank you for allowing us  to be a part of your care.      Edson Graces, MD 11/17/23 [phone]

## 2023-11-24 ENCOUNTER — Other Ambulatory Visit: Payer: Self-pay

## 2023-11-24 ENCOUNTER — Emergency Department (HOSPITAL_BASED_OUTPATIENT_CLINIC_OR_DEPARTMENT_OTHER)

## 2023-11-24 ENCOUNTER — Encounter (HOSPITAL_BASED_OUTPATIENT_CLINIC_OR_DEPARTMENT_OTHER): Payer: Self-pay | Admitting: Emergency Medicine

## 2023-11-24 ENCOUNTER — Emergency Department (HOSPITAL_BASED_OUTPATIENT_CLINIC_OR_DEPARTMENT_OTHER)
Admission: EM | Admit: 2023-11-24 | Discharge: 2023-11-24 | Disposition: A | Discharge status: Home / Self Care | Attending: Emergency Medicine | Admitting: Emergency Medicine

## 2023-11-24 DIAGNOSIS — M545 Low back pain, unspecified: Secondary | ICD-10-CM | POA: Diagnosis present

## 2023-11-24 DIAGNOSIS — R079 Chest pain, unspecified: Secondary | ICD-10-CM | POA: Insufficient documentation

## 2023-11-24 DIAGNOSIS — M549 Dorsalgia, unspecified: Secondary | ICD-10-CM

## 2023-11-24 LAB — CBC WITH DIFFERENTIAL/PLATELET
Abs Immature Granulocytes: 0.01 10*3/uL (ref 0.00–0.07)
Basophils Absolute: 0.1 10*3/uL (ref 0.0–0.1)
Basophils Relative: 1 %
Eosinophils Absolute: 0.1 10*3/uL (ref 0.0–0.5)
Eosinophils Relative: 2 %
HCT: 39.6 % (ref 36.0–46.0)
Hemoglobin: 13.3 g/dL (ref 12.0–15.0)
Immature Granulocytes: 0 %
Lymphocytes Relative: 38 %
Lymphs Abs: 1.8 10*3/uL (ref 0.7–4.0)
MCH: 29.4 pg (ref 26.0–34.0)
MCHC: 33.6 g/dL (ref 30.0–36.0)
MCV: 87.4 fL (ref 80.0–100.0)
Monocytes Absolute: 0.5 10*3/uL (ref 0.1–1.0)
Monocytes Relative: 10 %
Neutro Abs: 2.3 10*3/uL (ref 1.7–7.7)
Neutrophils Relative %: 49 %
Platelets: 234 10*3/uL (ref 150–400)
RBC: 4.53 MIL/uL (ref 3.87–5.11)
RDW: 12.3 % (ref 11.5–15.5)
WBC: 4.7 10*3/uL (ref 4.0–10.5)
nRBC: 0 % (ref 0.0–0.2)

## 2023-11-24 LAB — BASIC METABOLIC PANEL WITH GFR
Anion gap: 14 (ref 5–15)
BUN: 12 mg/dL (ref 8–23)
CO2: 23 mmol/L (ref 22–32)
Calcium: 9.9 mg/dL (ref 8.9–10.3)
Chloride: 99 mmol/L (ref 98–111)
Creatinine, Ser: 0.9 mg/dL (ref 0.44–1.00)
GFR, Estimated: >60 mL/min (ref >60)
Glucose, Bld: 101 mg/dL — ABNORMAL HIGH (ref 70–99)
Potassium: 4.2 mmol/L (ref 3.5–5.1)
Sodium: 136 mmol/L (ref 135–145)

## 2023-11-24 LAB — URINALYSIS, ROUTINE W REFLEX MICROSCOPIC
Bacteria, UA: NONE SEEN
Bilirubin Urine: NEGATIVE
Glucose, UA: NEGATIVE mg/dL
Hgb urine dipstick: NEGATIVE
Ketones, ur: NEGATIVE mg/dL
Nitrite: NEGATIVE
Protein, ur: NEGATIVE mg/dL
Specific Gravity, Urine: 1.005 (ref 1.005–1.030)
pH: 6 (ref 5.0–8.0)

## 2023-11-24 LAB — TROPONIN T, HIGH SENSITIVITY
Troponin T High Sensitivity: <15 ng/L (ref <19)
Troponin T High Sensitivity: <15 ng/L (ref <19)

## 2023-11-24 MED ORDER — KETOROLAC TROMETHAMINE 15 MG/ML IJ SOLN
15.0000 mg | Freq: Once | INTRAMUSCULAR | Status: AC
  Administered 2023-11-24: 15 mg via INTRAVENOUS
  Filled 2023-11-24: qty 1

## 2023-11-24 MED ORDER — ACETAMINOPHEN 500 MG PO TABS
1000.0000 mg | ORAL_TABLET | Freq: Once | ORAL | Status: AC
  Administered 2023-11-24: 1000 mg via ORAL
  Filled 2023-11-24: qty 2

## 2023-11-24 NOTE — ED Provider Notes (Addendum)
 Delta troponin also less than 15 very reassuring.  Discharge as per Dr. Glendon plan.  In addition blood pressures fairly controlled.  Systolics around 140.   Keyshia Orwick, MD 11/24/23 9083    Geraldene Hamilton, MD 11/24/23 [phone]

## 2023-11-24 NOTE — ED Triage Notes (Signed)
 Pt c/o lower back pain, right shoulder pain, and fluctuating BP since yesterday. States that the highest was 165/80, lowest 130/80. Pt states that she feels weak when her BP fluctuates.

## 2023-11-24 NOTE — ED Provider Notes (Signed)
 [place] EMERGENCY DEPARTMENT AT [facility] Provider Note   CSN: [id number] Arrival date & time: 11/24/23  0456     History Chief Complaint  Patient presents with   Back Pain    HPI Stacey Francis is a 68 y.o. female presenting for a myriad of problems. States that she has had higher than normal blood pressures feeling tremulous. Also states some chest and back pain. STEMI last march. 2x stents.   Patient's recorded medical, surgical, social, medication list and allergies were reviewed in the Snapshot window as part of the initial history.   Review of Systems   Review of Systems  Constitutional:  Negative for chills and fever.  HENT:  Negative for ear pain and sore throat.   Eyes:  Negative for pain and visual disturbance.  Respiratory:  Positive for chest tightness. Negative for cough and shortness of breath.   Cardiovascular:  Negative for chest pain and palpitations.  Gastrointestinal:  Negative for abdominal pain and vomiting.  Genitourinary:  Negative for dysuria and hematuria.  Musculoskeletal:  Positive for back pain. Negative for arthralgias.  Skin:  Negative for color change and rash.  Neurological:  Negative for seizures and syncope.  All other systems reviewed and are negative.   Physical Exam Updated Vital Signs BP (!) 155/80   Pulse 74   Temp 98 F (36.7 C)   Resp 18   Ht 5' 3 (1.6 m)   Wt 55.3 kg   SpO2 100%   BMI 21.61 kg/m  Physical Exam Vitals and nursing note reviewed.  Constitutional:      General: She is not in acute distress.    Appearance: She is well-developed.  HENT:     Head: Normocephalic and atraumatic.   Eyes:     Conjunctiva/sclera: Conjunctivae normal.    Cardiovascular:     Rate and Rhythm: Normal rate and regular rhythm.     Heart sounds: No murmur heard. Pulmonary:     Effort: Pulmonary effort is normal. No respiratory distress.     Breath sounds: Normal breath sounds.  Abdominal:     General: There is  no distension.     Palpations: Abdomen is soft.     Tenderness: There is no abdominal tenderness. There is no right CVA tenderness or left CVA tenderness.   Musculoskeletal:        General: No swelling or tenderness. Normal range of motion.     Cervical back: Neck supple.   Skin:    General: Skin is warm and dry.   Neurological:     General: No focal deficit present.     Mental Status: She is alert and oriented to person, place, and time. Mental status is at baseline.     Cranial Nerves: No cranial nerve deficit.      ED Course/ Medical Decision Making/ A&P    Procedures Procedures   Medications Ordered in ED Medications  acetaminophen  (TYLENOL ) tablet 1,000 mg (1,000 mg Oral Given 11/24/23 0605)  ketorolac  (TORADOL ) 15 MG/ML injection 15 mg (15 mg Intravenous Given 11/24/23 0606)   Medical Decision Making: Stacey Francis is a 68 y.o. female who presented to the ED today with chest pain, detailed above.  Based on patient's comorbidities, patient has a baseline high risk of heart disease.    Additional history discussed with patient's family/caregivers.  Patient placed on continuous vitals and telemetry monitoring while in ED which was reviewed periodically.  Complete initial physical exam performed, notably the patient was  hemodynamically stable no acute distress.   Reviewed and confirmed nursing documentation for past medical history, family history, social history.    Initial Assessment: With the patient's presentation of left-sided chest pain, most likely diagnosis is musculoskeletal chest pain versus GERD, although ACS remains on the differential. Other diagnoses were considered including (but not limited to) pulmonary embolism, community-acquired pneumonia, aortic dissection, pneumothorax, underlying bony abnormality, anemia. These are considered less likely due to history of present illness and physical exam findings.    This is most consistent with an acute life/limb  threatening illness complicated by underlying chronic conditions.   Initial Plan: EKG and serial troponin to evaluate for cardiac pathology. Evaluate for dissection, bony abnormality, or pneumonia with chest x-ray and screening laboratory evaluation including CBC, BMP  Further evaluation for pulmonary embolism not indicated at this time based on patient's PERC and Wells score.  Further evaluation for Thoracic Aortic Dissection not indicated at this time based on patient's clinical history and PE findings.   Initial Study Results: EKG was reviewed independently. Rate, rhythm, axis, intervals all examined and without medically relevant abnormality. ST segments without concerns for elevations.    Laboratory  Delta troponin demonstrated no acute pathology  CBC and BMP without obvious metabolic or inflammatory abnormalities requiring further evaluation   Radiology  DG Chest Port 1 View Result Date: 11/16/2023 EXAM: 1 VIEW XRAY OF THE CHEST 11/16/2023 11:43:00 PM COMPARISON: 12/01/2022 CLINICAL HISTORY: [id number] Chest pain [id number]. Pt reports fatigue x1 hour ago and began to develop chest pressure. Pt endorses chills. Pt denies any N/V/D. Pt reports MI x1 year ago. Pt has x2 stents. FINDINGS: LUNGS AND PLEURA: No focal pulmonary opacity. No pulmonary edema. No pleural effusion. No pneumothorax. HEART AND MEDIASTINUM: No acute abnormality of the cardiac and mediastinal silhouettes. BONES AND SOFT TISSUES: No acute osseous abnormality. IMPRESSION: 1. No acute process. Electronically signed by: Pinkie Pebbles MD 11/16/2023 11:51 PM EDT RP Workstation: [id number]    Final Assessment and Plan: Patient's history of present illness and physical exam findings currently most consistent with acute hypertension without evidence of crisis. She has no focal pathology on my initial evaluation though her serial troponin is pending given her reported chest pain overnight and recent STEMI. She will be closely  observed here in the emergency department on telemetry pending serial troponin.  If serial troponin is negative and blood pressure remains controlled, she will be stable for outpatient care with her cardiologist.  If blood pressure is elevated, it would be worthwhile to consider increasing her metoprolol  to 25 twice daily from 12.5 twice daily until she follows up with cardiology.   Clinical Impression:  1. Acute back pain, unspecified back location, unspecified back pain laterality   2. Chest pain, unspecified type      Data Unavailable   Final Clinical Impression(s) / ED Diagnoses Final diagnoses:  Acute back pain, unspecified back location, unspecified back pain laterality  Chest pain, unspecified type    Rx / DC Orders ED Discharge Orders     None         Jerral Meth, MD 11/24/23 [phone]

## 2023-11-24 NOTE — ED Notes (Signed)
 Dc instructions reviewed with patient. Patient voiced understanding. Dc with belongings.

## 2023-11-24 NOTE — Discharge Instructions (Addendum)
 Workup for the chest pain without any acute findings.  Would recommend keeping your blood pressure log and following up with cardiology about your concerns.  Return for any new or worse symptoms.  Regardless of what your blood pressure is.  Blood pressure is improved here significantly and is quite normal currently.

## 2023-12-16 ENCOUNTER — Other Ambulatory Visit: Payer: Self-pay

## 2023-12-16 DIAGNOSIS — I10 Essential (primary) hypertension: Secondary | ICD-10-CM | POA: Insufficient documentation

## 2023-12-16 DIAGNOSIS — T63441A Toxic effect of venom of bees, accidental (unintentional), initial encounter: Secondary | ICD-10-CM | POA: Diagnosis not present

## 2023-12-16 DIAGNOSIS — T7840XA Allergy, unspecified, initial encounter: Secondary | ICD-10-CM | POA: Diagnosis present

## 2023-12-16 DIAGNOSIS — I2510 Atherosclerotic heart disease of native coronary artery without angina pectoris: Secondary | ICD-10-CM | POA: Diagnosis not present

## 2023-12-16 MED ORDER — PREDNISONE 20 MG PO TABS
60.0000 mg | ORAL_TABLET | Freq: Once | ORAL | Status: AC
  Administered 2023-12-17: 60 mg via ORAL
  Filled 2023-12-16: qty 3

## 2023-12-16 NOTE — ED Notes (Signed)
 Pt is refusing ordered prednisone  due to a weird reaction.

## 2023-12-16 NOTE — ED Triage Notes (Addendum)
 Pt to ed from home via POV for allergic reaction. Pt was stung by multiple unknown type of bees. Pt took her prescribed epi pen at 440 pm when this happened. Pt has moderate swelling to left hand and left leg and redness on back of right calf. Pt also took PO benadryl. Pt has no airway involvement. Pt is caox4, in no acute distress and ambulatory in triage. Pt has also taken PO tylenol  for pain.

## 2023-12-17 ENCOUNTER — Emergency Department
Admission: EM | Admit: 2023-12-17 | Discharge: 2023-12-17 | Disposition: A | Discharge status: Home / Self Care | Attending: Emergency Medicine | Admitting: Emergency Medicine

## 2023-12-17 DIAGNOSIS — T7840XA Allergy, unspecified, initial encounter: Secondary | ICD-10-CM

## 2023-12-17 DIAGNOSIS — T63441A Toxic effect of venom of bees, accidental (unintentional), initial encounter: Secondary | ICD-10-CM

## 2023-12-17 MED ORDER — PREDNISONE 10 MG PO TABS
ORAL_TABLET | ORAL | 0 refills | Status: AC
Start: 2023-12-17 — End: 2023-12-27

## 2023-12-17 NOTE — ED Notes (Signed)
 Swelling to patient's hand, forearm, and leg has increased along with redness.  She says that she has taken benadryl at home, and it seems that when the benadryl begins to wear off, the swelling comes back.  Physician has been notified.

## 2023-12-17 NOTE — ED Provider Notes (Signed)
 [facility] Provider Note    Event Date/Time   First MD Initiated Contact with Patient 12/17/23 [phone]     (approximate)   History   Chief Complaint Allergic Reaction   HPI  ADYSSON REVELLE is a 68 y.o. female with past medical history of hypertension, CAD, and PAD who presents to the ED complaining of allergic reaction.  Patient reports that around 4 PM this afternoon she was stung by multiple bees across her legs and left arm.  She states that she has had severe reactions to bee stings in the past and so shortly afterwards administered IM epinephrine .  She had some tightness in her throat that has since resolved, did not have any lightheadedness, nausea, vomiting, or hives.  She does report pain and swelling around the areas where she was stung which has progressed since initial injury.  She has taken a couple of doses of Benadryl, which seems to help before wearing off again and swelling worsens.     Physical Exam   Triage Vital Signs: ED Triage Vitals  Encounter Vitals Group     BP 12/16/23 2304 115/82     Girls Systolic BP Percentile --      Girls Diastolic BP Percentile --      Boys Systolic BP Percentile --      Boys Diastolic BP Percentile --      Pulse Rate 12/16/23 2304 95     Resp 12/16/23 2304 16     Temp 12/16/23 2304 98 F (36.7 C)     Temp Source 12/16/23 2304 Oral     SpO2 12/16/23 2304 98 %     Weight --      Height 12/16/23 2305 5' 3 (1.6 m)     Head Circumference --      Peak Flow --      Pain Score 12/16/23 2305 5     Pain Loc --      Pain Education --      Exclude from Growth Chart --     Most recent vital signs: Vitals:   12/16/23 2304 12/17/23 0100  BP: 115/82 (!) 162/78  Pulse: 95 91  Resp: 16 16  Temp: 98 F (36.7 C) 98.1 F (36.7 C)  SpO2: 98% 100%    Constitutional: Alert and oriented. Eyes: Conjunctivae are normal. Head: Atraumatic. Nose: No congestion/rhinnorhea. Mouth/Throat: Mucous membranes are  moist.  Cardiovascular: Normal rate, regular rhythm. Grossly normal heart sounds.  2+ radial pulses bilaterally. Respiratory: Normal respiratory effort.  No retractions. Lungs CTAB. Gastrointestinal: Soft and nontender. No distention. Musculoskeletal: Multiple insect bites to left arm and bilateral legs with surrounding erythema and edema noted. Neurologic:  Normal speech and language. No gross focal neurologic deficits are appreciated.    ED Results / Procedures / Treatments   Labs (all labs ordered are listed, but only abnormal results are displayed) Labs Reviewed - No data to display   PROCEDURES:  Critical Care performed: No  Procedures   MEDICATIONS ORDERED IN ED: Medications  predniSONE  (DELTASONE ) tablet 60 mg (60 mg Oral Given 12/17/23 0113)     IMPRESSION / MDM / ASSESSMENT AND PLAN / ED COURSE  I reviewed the triage vital signs and the nursing notes.                              68 y.o. female with past medical history of hypertension, CAD, and PAD who  presents to the ED complaining of pain and swelling to multiple areas where she was stung by bees about 8 hours ago.  Patient's presentation is most consistent with acute, uncomplicated illness.  Differential diagnosis includes, but is not limited to, anaphylaxis, allergic reaction, insect sting.  Patient nontoxic-appearing and in no acute distress, vital signs are unremarkable.  No features concerning for anaphylaxis at this time but she does have worsening swelling around sites where she was stung.  She has been taking Benadryl with partial improvement, will give dose of prednisone  and apply ice to swollen areas, reassess.  Swelling improved on reassessment and patient is appropriate for discharge home with prescription for steroid taper.  She was counseled to continue ice and regular Benadryl, also counseled on symptoms to return to the ED for.  Patient agrees with plan.      FINAL CLINICAL IMPRESSION(S) / ED  DIAGNOSES   Final diagnoses:  Allergic reaction, initial encounter  Bee sting reaction, accidental or unintentional, initial encounter     Rx / DC Orders   ED Discharge Orders          Ordered    predniSONE  (DELTASONE ) 10 MG tablet  Daily        12/17/23 0216             Note:  This document was prepared using Dragon voice recognition software and may include unintentional dictation errors.   Willo Dunnings, MD 12/17/23 [phone]

## 2023-12-28 ENCOUNTER — Other Ambulatory Visit: Payer: Self-pay | Admitting: Internal Medicine

## 2023-12-28 DIAGNOSIS — R0789 Other chest pain: Secondary | ICD-10-CM

## 2023-12-28 DIAGNOSIS — I2510 Atherosclerotic heart disease of native coronary artery without angina pectoris: Secondary | ICD-10-CM

## 2024-01-07 ENCOUNTER — Other Ambulatory Visit (INDEPENDENT_AMBULATORY_CARE_PROVIDER_SITE_OTHER): Payer: Self-pay | Admitting: Nurse Practitioner

## 2024-01-07 DIAGNOSIS — I83899 Varicose veins of unspecified lower extremities with other complications: Secondary | ICD-10-CM

## 2024-01-09 ENCOUNTER — Encounter (INDEPENDENT_AMBULATORY_CARE_PROVIDER_SITE_OTHER): Payer: Self-pay | Admitting: Nurse Practitioner

## 2024-01-09 ENCOUNTER — Ambulatory Visit (INDEPENDENT_AMBULATORY_CARE_PROVIDER_SITE_OTHER)

## 2024-01-09 ENCOUNTER — Ambulatory Visit (INDEPENDENT_AMBULATORY_CARE_PROVIDER_SITE_OTHER): Admitting: Nurse Practitioner

## 2024-01-09 VITALS — BP 152/74 | HR 73 | Resp 18 | Wt 123.6 lb

## 2024-01-09 DIAGNOSIS — I8312 Varicose veins of left lower extremity with inflammation: Secondary | ICD-10-CM

## 2024-01-09 DIAGNOSIS — I83899 Varicose veins of unspecified lower extremities with other complications: Secondary | ICD-10-CM

## 2024-01-09 DIAGNOSIS — I8311 Varicose veins of right lower extremity with inflammation: Secondary | ICD-10-CM

## 2024-01-09 DIAGNOSIS — I10 Essential (primary) hypertension: Secondary | ICD-10-CM

## 2024-01-09 DIAGNOSIS — I6523 Occlusion and stenosis of bilateral carotid arteries: Secondary | ICD-10-CM

## 2024-01-13 ENCOUNTER — Encounter (INDEPENDENT_AMBULATORY_CARE_PROVIDER_SITE_OTHER): Payer: Self-pay | Admitting: Nurse Practitioner

## 2024-01-13 NOTE — Progress Notes (Incomplete)
 Subjective:    Patient ID: Stacey Francis, female    DOB: December 11, 1955, 68 y.o.   MRN: [id number] Chief Complaint  Patient presents with  . Follow-up    Ref [facility] consult varicose veins of lower extremity     HPI  Review of Systems  All other systems reviewed and are negative.      Objective:   Physical Exam Vitals reviewed.  HENT:     Head: Normocephalic.  Cardiovascular:     Rate and Rhythm: Normal rate.     Pulses: Normal pulses.  Pulmonary:     Effort: Pulmonary effort is normal.  Skin:    General: Skin is warm and dry.  Neurological:     Mental Status: She is alert and oriented to person, place, and time.  Psychiatric:        Mood and Affect: Mood normal.        Behavior: Behavior normal.        Thought Content: Thought content normal.        Judgment: Judgment normal.     BP (!) 152/74   Pulse 73   Resp 18   Wt 123 lb 9.6 oz (56.1 kg)   BMI 21.89 kg/m   Past Medical History:  Diagnosis Date  . Hypertension   . Sinus infection     Social History   Socioeconomic History  . Marital status: Divorced    Spouse name: Not on file  . Number of children: Not on file  . Years of education: Not on file  . Highest education level: Not on file  Occupational History  . Not on file  Tobacco Use  . Smoking status: Never  . Smokeless tobacco: Never  Vaping Use  . Vaping status: Never Used  Substance and Sexual Activity  . Alcohol use: No  . Drug use: No  . Sexual activity: Not on file  Other Topics Concern  . Not on file  Social History Narrative  . Not on file   Social Drivers of Health   Financial Resource Strain: Not on file  Food Insecurity: No Food Insecurity (08/13/2022)   Hunger Vital Sign   . Worried About Programme researcher, broadcasting/film/video in the Last Year: Never true   . Ran Out of Food in the Last Year: Never true  Transportation Needs: No Transportation Needs (08/13/2022)   PRAPARE - Transportation   . Lack of Transportation (Medical): No   .  Lack of Transportation (Non-Medical): No  Physical Activity: Not on file  Stress: Not on file  Social Connections: Not on file  Intimate Partner Violence: Not At Risk (08/13/2022)   Humiliation, Afraid, Rape, and Kick questionnaire   . Fear of Current or Ex-Partner: No   . Emotionally Abused: No   . Physically Abused: No   . Sexually Abused: No    Past Surgical History:  Procedure Laterality Date  . CORONARY/GRAFT ACUTE MI REVASCULARIZATION N/A 08/13/2022   Procedure: Coronary/Graft Acute MI Revascularization;  Surgeon: Florencio Cara BIRCH, MD;  Location: [facility] INVASIVE CV LAB;  Service: Cardiovascular;  Laterality: N/A;  . ECTOPIC PREGNANCY SURGERY    . LEFT HEART CATH AND CORONARY ANGIOGRAPHY N/A 08/13/2022   Procedure: LEFT HEART CATH AND CORONARY ANGIOGRAPHY;  Surgeon: Florencio Cara BIRCH, MD;  Location: [facility] INVASIVE CV LAB;  Service: Cardiovascular;  Laterality: N/A;    Family History  Problem Relation Age of Onset  . Liver disease Mother   . Stroke Father   . Stroke  Brother   . Heart attack Brother   . Breast cancer Neg Hx     Allergies  Allergen Reactions  . Penicillins Swelling    Other Reaction(s): Swelling in hands/arms       Latest Ref Rng & Units 11/24/2023    5:20 AM 11/16/2023   11:09 PM 02/02/2023    4:44 PM  CBC  WBC 4.0 - 10.5 K/uL 4.7  5.2  6.6   Hemoglobin 12.0 - 15.0 g/dL 86.6  88.3  87.6   Hematocrit 36.0 - 46.0 % 39.6  35.0  36.6   Platelets 150 - 400 K/uL 234  216  227       CMP     Component Value Date/Time   NA 136 11/24/2023 0520   K 4.2 11/24/2023 0520   CL 99 11/24/2023 0520   CO2 23 11/24/2023 0520   GLUCOSE 101 (H) 11/24/2023 0520   BUN 12 11/24/2023 0520   CREATININE 0.90 11/24/2023 0520   CALCIUM  9.9 11/24/2023 0520   PROT 7.4 02/02/2023 1644   ALBUMIN 4.4 02/02/2023 1644   AST 21 02/02/2023 1644   ALT 16 02/02/2023 1644   ALKPHOS 41 02/02/2023 1644   BILITOT 1.2 02/02/2023 1644   GFRNONAA >60 11/24/2023 0520     No  results found.     Assessment & Plan:   1. Bilateral carotid artery stenosis (Primary) Recommend:  Given the patient's asymptomatic subcritical stenosis no further invasive testing or surgery at this time.  Duplex ultrasound shows <40% stenosis bilaterally.  Continue antiplatelet therapy as prescribed Continue management of CAD, HTN and Hyperlipidemia Healthy heart diet,  encouraged exercise at least 4 times per week  Follow up in 12 months with duplex ultrasound and physical exam   2. Essential hypertension Continue antihypertensive medications as already ordered, these medications have been reviewed and there are no changes at this time.  3. Varicose veins of both lower extremities with inflammation Recommend:  The patient has had successful ablation of the previously incompetent saphenous venous system but still has persistent symptoms of pain and swelling that are having a negative impact on daily life and daily activities.  Patient should undergo injection sclerotherapy to treat the residual varicosities.  The risks, benefits and alternative therapies were reviewed in detail with the patient.  All questions were answered.  The patient agrees to proceed with sclerotherapy at their convenience.  The patient will continue wearing the graduated compression stockings and using the over-the-counter pain medications to treat her symptoms.       Current Outpatient Medications on File Prior to Visit  Medication Sig Dispense Refill  . atorvastatin  (LIPITOR ) 80 MG tablet Take 1 tablet (80 mg total) by mouth daily. 30 tablet 11  . cetirizine (ZYRTEC) 10 MG tablet Take by mouth as needed.    . dicyclomine  (BENTYL ) 10 MG capsule Take 1 capsule (10 mg total) by mouth 3 (three) times daily as needed for spasms. 30 capsule 0  . lisinopril  (ZESTRIL ) 2.5 MG tablet Take 1 tablet (2.5 mg total) by mouth daily. 30 tablet 2  . metoprolol  succinate (TOPROL -XL) 25 MG 24 hr tablet Take by mouth.     . naproxen (NAPROSYN) 500 MG tablet Take 500 mg by mouth as needed.    . nitroGLYCERIN  (NITROSTAT ) 0.4 MG SL tablet Place 1 tablet (0.4 mg total) under the tongue every 5 (five) minutes as needed for chest pain. 30 tablet 0  . levothyroxine  (SYNTHROID ) 25 MCG tablet Take 25 mcg by  mouth daily before breakfast. (Patient not taking: Reported on 01/09/2024)    . levothyroxine  (SYNTHROID ) 25 MCG tablet Take by mouth. (Patient not taking: Reported on 01/09/2024)    . metoprolol  tartrate (LOPRESSOR ) 25 MG tablet Take 0.5 tablets (12.5 mg total) by mouth 2 (two) times daily. (Patient not taking: Reported on 01/09/2024) 30 tablet 5  . metoprolol  tartrate (LOPRESSOR ) 25 MG tablet Take by mouth. (Patient not taking: Reported on 01/09/2024)     No current facility-administered medications on file prior to visit.    There are no Patient Instructions on file for this visit. No follow-ups on file.   Sander Speckman E Harley Fitzwater, NP

## 2024-01-13 NOTE — Progress Notes (Signed)
 Subjective:    Patient ID: Stacey Francis, female    DOB: [birth year], 68 y.o.   MRN: [id number] Chief Complaint  Patient presents with   Follow-up    Ref [facility] consult varicose veins of lower extremity     The patient is seen for follow up evaluation of carotid stenosis. The carotid stenosis followed by ultrasound.   The patient denies amaurosis fugax. There is no recent history of TIA symptoms or focal motor deficits. There is no prior documented CVA.  The patient is taking enteric-coated aspirin  81 mg daily.  There is no history of migraine headaches. There is no history of seizures.  No recent shortening of the patient's walking distance or new symptoms consistent with claudication.  No history of rest pain symptoms. No new ulcers or wounds of the lower extremities have occurred.  There is no history of DVT, PE or superficial thrombophlebitis. No documented recent episodes of angina or shortness of breath documented.   Carotid Duplex done today shows <40% bilaterally .   In addition the patient has symptomatic varicose veins. The patient relates burning and stinging which worsened steadily throughout the course of the day, particularly with standing. The patient also notes an aching and throbbing pain over the varicosities, particularly with prolonged dependent positions. The symptoms are significantly improved with elevation.  The patient also notes that during hot weather the symptoms are greatly intensified. The patient states the pain from the varicose veins interferes with work, daily exercise, shopping and household maintenance. At this point, the symptoms are persistent and severe enough that they're having a negative impact on lifestyle and are interfering with daily activities.  There is no history of DVT, PE or superficial thrombophlebitis. There is no history of ulceration or hemorrhage. The patient denies a significant family history of varicose veins.  The patient  has worn graduated compression in the past. At the present time the patient has been using over-the-counter analgesics. There is no history of prior surgical intervention or sclerotherapy.  .  There is no evidence of DVT or superficial thrombophlebitis bilaterally.  No evidence of deep venous insufficiency.  No evidence of superficial venous reflux.    Review of Systems  All other systems reviewed and are negative.      Objective:   Physical Exam Vitals reviewed.  HENT:     Head: Normocephalic.  Cardiovascular:     Rate and Rhythm: Normal rate.     Pulses: Normal pulses.  Pulmonary:     Effort: Pulmonary effort is normal.  Skin:    General: Skin is warm and dry.  Neurological:     Mental Status: She is alert and oriented to person, place, and time.  Psychiatric:        Mood and Affect: Mood normal.        Behavior: Behavior normal.        Thought Content: Thought content normal.        Judgment: Judgment normal.     BP (!) 152/74   Pulse 73   Resp 18   Wt 123 lb 9.6 oz (56.1 kg)   BMI 21.89 kg/m   Past Medical History:  Diagnosis Date   Hypertension    Sinus infection     Social History   Socioeconomic History   Marital status: Divorced    Spouse name: Not on file   Number of children: Not on file   Years of education: Not on file   Highest education level:  Not on file  Occupational History   Not on file  Tobacco Use   Smoking status: Never   Smokeless tobacco: Never  Vaping Use   Vaping status: Never Used  Substance and Sexual Activity   Alcohol use: No   Drug use: No   Sexual activity: Not on file  Other Topics Concern   Not on file  Social History Narrative   Not on file   Social Drivers of Health   Financial Resource Strain: Not on file  Food Insecurity: No Food Insecurity (08/13/2022)   Hunger Vital Sign    Worried About Running Out of Food in the Last Year: Never true    Ran Out of Food in the Last Year: Never true  Transportation  Needs: No Transportation Needs (08/13/2022)   PRAPARE - Administrator, Civil Service (Medical): No    Lack of Transportation (Non-Medical): No  Physical Activity: Not on file  Stress: Not on file  Social Connections: Not on file  Intimate Partner Violence: Not At Risk (08/13/2022)   Humiliation, Afraid, Rape, and Kick questionnaire    Fear of Current or Ex-Partner: No    Emotionally Abused: No    Physically Abused: No    Sexually Abused: No    Past Surgical History:  Procedure Laterality Date   CORONARY/GRAFT ACUTE MI REVASCULARIZATION N/A 08/13/2022   Procedure: Coronary/Graft Acute MI Revascularization;  Surgeon: Florencio Cara BIRCH, MD;  Location: [facility] INVASIVE CV LAB;  Service: Cardiovascular;  Laterality: N/A;   ECTOPIC PREGNANCY SURGERY     LEFT HEART CATH AND CORONARY ANGIOGRAPHY N/A 08/13/2022   Procedure: LEFT HEART CATH AND CORONARY ANGIOGRAPHY;  Surgeon: Florencio Cara BIRCH, MD;  Location: [facility] INVASIVE CV LAB;  Service: Cardiovascular;  Laterality: N/A;    Family History  Problem Relation Age of Onset   Liver disease Mother    Stroke Father    Stroke Brother    Heart attack Brother    Breast cancer Neg Hx     Allergies  Allergen Reactions   Penicillins Swelling    Other Reaction(s): Swelling in hands/arms       Latest Ref Rng & Units 11/24/2023    5:20 AM 11/16/2023   11:09 PM 02/02/2023    4:44 PM  CBC  WBC 4.0 - 10.5 K/uL 4.7  5.2  6.6   Hemoglobin 12.0 - 15.0 g/dL 86.6  88.3  87.6   Hematocrit 36.0 - 46.0 % 39.6  35.0  36.6   Platelets 150 - 400 K/uL 234  216  227       CMP     Component Value Date/Time   NA 136 11/24/2023 0520   K 4.2 11/24/2023 0520   CL 99 11/24/2023 0520   CO2 23 11/24/2023 0520   GLUCOSE 101 (H) 11/24/2023 0520   BUN 12 11/24/2023 0520   CREATININE 0.90 11/24/2023 0520   CALCIUM  9.9 11/24/2023 0520   PROT 7.4 02/02/2023 1644   ALBUMIN 4.4 02/02/2023 1644   AST 21 02/02/2023 1644   ALT 16 02/02/2023 1644    ALKPHOS 41 02/02/2023 1644   BILITOT 1.2 02/02/2023 1644   GFRNONAA >60 11/24/2023 0520     No results found.     Assessment & Plan:   1. Bilateral carotid artery stenosis (Primary) Recommend:  Given the patient's asymptomatic subcritical stenosis no further invasive testing or surgery at this time.  Duplex ultrasound shows <40% stenosis bilaterally.  Continue antiplatelet therapy as prescribed Continue management of  CAD, HTN and Hyperlipidemia Healthy heart diet,  encouraged exercise at least 4 times per week  Follow up in 12 months with duplex ultrasound and physical exam   2. Essential hypertension Continue antihypertensive medications as already ordered, these medications have been reviewed and there are no changes at this time.  3. Varicose veins of both lower extremities with inflammation Recommend:  The patient has had successful ablation of the previously incompetent saphenous venous system but still has persistent symptoms of pain and swelling that are having a negative impact on daily life and daily activities.  Patient should undergo injection sclerotherapy to treat the residual varicosities.  The risks, benefits and alternative therapies were reviewed in detail with the patient.  All questions were answered.  The patient agrees to proceed with sclerotherapy at their convenience.  The patient will continue wearing the graduated compression stockings and using the over-the-counter pain medications to treat her symptoms.       Current Outpatient Medications on File Prior to Visit  Medication Sig Dispense Refill   atorvastatin  (LIPITOR ) 80 MG tablet Take 1 tablet (80 mg total) by mouth daily. 30 tablet 11   cetirizine (ZYRTEC) 10 MG tablet Take by mouth as needed.     dicyclomine  (BENTYL ) 10 MG capsule Take 1 capsule (10 mg total) by mouth 3 (three) times daily as needed for spasms. 30 capsule 0   lisinopril  (ZESTRIL ) 2.5 MG tablet Take 1 tablet (2.5 mg total) by  mouth daily. 30 tablet 2   metoprolol  succinate (TOPROL -XL) 25 MG 24 hr tablet Take by mouth.     naproxen (NAPROSYN) 500 MG tablet Take 500 mg by mouth as needed.     nitroGLYCERIN  (NITROSTAT ) 0.4 MG SL tablet Place 1 tablet (0.4 mg total) under the tongue every 5 (five) minutes as needed for chest pain. 30 tablet 0   levothyroxine  (SYNTHROID ) 25 MCG tablet Take 25 mcg by mouth daily before breakfast. (Patient not taking: Reported on 01/09/2024)     levothyroxine  (SYNTHROID ) 25 MCG tablet Take by mouth. (Patient not taking: Reported on 01/09/2024)     metoprolol  tartrate (LOPRESSOR ) 25 MG tablet Take 0.5 tablets (12.5 mg total) by mouth 2 (two) times daily. (Patient not taking: Reported on 01/09/2024) 30 tablet 5   metoprolol  tartrate (LOPRESSOR ) 25 MG tablet Take by mouth. (Patient not taking: Reported on 01/09/2024)     No current facility-administered medications on file prior to visit.    There are no Patient Instructions on file for this visit. No follow-ups on file.   Syrita Dovel E Yoshiaki Kreuser, NP

## 2024-01-30 ENCOUNTER — Other Ambulatory Visit: Payer: Self-pay | Admitting: Internal Medicine

## 2024-01-30 ENCOUNTER — Ambulatory Visit
Admission: RE | Admit: 2024-01-30 | Discharge: 2024-01-30 | Disposition: A | Discharge status: Home / Self Care | Source: Ambulatory Visit | Attending: Internal Medicine | Admitting: Internal Medicine

## 2024-01-30 DIAGNOSIS — R0789 Other chest pain: Secondary | ICD-10-CM

## 2024-01-30 DIAGNOSIS — I2510 Atherosclerotic heart disease of native coronary artery without angina pectoris: Secondary | ICD-10-CM

## 2024-01-30 DIAGNOSIS — I2583 Coronary atherosclerosis due to lipid rich plaque: Secondary | ICD-10-CM | POA: Diagnosis present

## 2024-01-30 MED ORDER — GADOBUTROL 1 MMOL/ML IV SOLN
8.0000 mL | Freq: Once | INTRAVENOUS | Status: AC | PRN
  Administered 2024-01-30: 8 mL via INTRAVENOUS

## 2024-02-14 ENCOUNTER — Encounter (INDEPENDENT_AMBULATORY_CARE_PROVIDER_SITE_OTHER): Payer: No Typology Code available for payment source

## 2024-02-14 ENCOUNTER — Ambulatory Visit (INDEPENDENT_AMBULATORY_CARE_PROVIDER_SITE_OTHER): Payer: No Typology Code available for payment source | Admitting: Vascular Surgery

## 2024-03-10 ENCOUNTER — Other Ambulatory Visit: Payer: Self-pay | Admitting: Internal Medicine

## 2024-03-10 DIAGNOSIS — Z1231 Encounter for screening mammogram for malignant neoplasm of breast: Secondary | ICD-10-CM
